# Patient Record
Sex: Male | Born: 1935 | Race: White | Hispanic: No | Smoking: Former smoker
Health system: Southern US, Community
[De-identification: ages and names within clinical notes are randomized; demographics above are authoritative.]

## PROBLEM LIST (undated history)

## (undated) DIAGNOSIS — I509 Heart failure, unspecified: Secondary | ICD-10-CM

## (undated) DIAGNOSIS — R440 Auditory hallucinations: Secondary | ICD-10-CM

## (undated) DIAGNOSIS — K219 Gastro-esophageal reflux disease without esophagitis: Secondary | ICD-10-CM

## (undated) DIAGNOSIS — I10 Essential (primary) hypertension: Secondary | ICD-10-CM

## (undated) DIAGNOSIS — C4490 Unspecified malignant neoplasm of skin, unspecified: Secondary | ICD-10-CM

## (undated) DIAGNOSIS — N400 Enlarged prostate without lower urinary tract symptoms: Secondary | ICD-10-CM

## (undated) DIAGNOSIS — F528 Other sexual dysfunction not due to a substance or known physiological condition: Secondary | ICD-10-CM

## (undated) DIAGNOSIS — F329 Major depressive disorder, single episode, unspecified: Secondary | ICD-10-CM

## (undated) DIAGNOSIS — R569 Unspecified convulsions: Secondary | ICD-10-CM

## (undated) DIAGNOSIS — E079 Disorder of thyroid, unspecified: Secondary | ICD-10-CM

## (undated) DIAGNOSIS — I2510 Atherosclerotic heart disease of native coronary artery without angina pectoris: Secondary | ICD-10-CM

## (undated) DIAGNOSIS — R000 Tachycardia, unspecified: Secondary | ICD-10-CM

## (undated) DIAGNOSIS — Z950 Presence of cardiac pacemaker: Secondary | ICD-10-CM

## (undated) DIAGNOSIS — I219 Acute myocardial infarction, unspecified: Secondary | ICD-10-CM

## (undated) DIAGNOSIS — I472 Ventricular tachycardia: Secondary | ICD-10-CM

## (undated) DIAGNOSIS — F32A Depression, unspecified: Secondary | ICD-10-CM

## (undated) DIAGNOSIS — Z95810 Presence of automatic (implantable) cardiac defibrillator: Secondary | ICD-10-CM

## (undated) DIAGNOSIS — E042 Nontoxic multinodular goiter: Secondary | ICD-10-CM

## (undated) DIAGNOSIS — Z8739 Personal history of other diseases of the musculoskeletal system and connective tissue: Secondary | ICD-10-CM

## (undated) HISTORY — PX: CARDIAC CATHETERIZATION: SHX172

## (undated) HISTORY — PX: CATARACT EXTRACTION W/ INTRAOCULAR LENS  IMPLANT, BILATERAL: SHX1307

## (undated) HISTORY — DX: Other sexual dysfunction not due to a substance or known physiological condition: F52.8

## (undated) HISTORY — PX: BI-VENTRICULAR IMPLANTABLE CARDIOVERTER DEFIBRILLATOR  (CRT-D): SHX5747

## (undated) HISTORY — DX: Disorder of thyroid, unspecified: E07.9

## (undated) HISTORY — DX: Atherosclerotic heart disease of native coronary artery without angina pectoris: I25.10

## (undated) HISTORY — DX: Essential (primary) hypertension: I10

## (undated) HISTORY — DX: Ventricular tachycardia: I47.2

## (undated) HISTORY — DX: Nontoxic multinodular goiter: E04.2

## (undated) HISTORY — DX: Benign prostatic hyperplasia without lower urinary tract symptoms: N40.0

## (undated) HISTORY — PX: SKIN CANCER EXCISION: SHX779

## (undated) HISTORY — DX: Tachycardia, unspecified: R00.0

---

## 2007-02-22 ENCOUNTER — Ambulatory Visit: Payer: Self-pay | Admitting: Endocrinology

## 2007-02-22 LAB — CONVERTED CEMR LAB: TSH: 0.26 microintl units/mL — ABNORMAL LOW (ref 0.35–5.50)

## 2007-02-23 ENCOUNTER — Encounter: Payer: Self-pay | Admitting: Endocrinology

## 2007-02-23 DIAGNOSIS — E0590 Thyrotoxicosis, unspecified without thyrotoxic crisis or storm: Secondary | ICD-10-CM | POA: Insufficient documentation

## 2007-02-23 DIAGNOSIS — I10 Essential (primary) hypertension: Secondary | ICD-10-CM | POA: Insufficient documentation

## 2007-02-23 DIAGNOSIS — N400 Enlarged prostate without lower urinary tract symptoms: Secondary | ICD-10-CM

## 2007-03-08 ENCOUNTER — Encounter: Payer: Self-pay | Admitting: *Deleted

## 2007-04-05 ENCOUNTER — Telehealth: Payer: Self-pay | Admitting: Endocrinology

## 2007-04-10 ENCOUNTER — Encounter: Payer: Self-pay | Admitting: Endocrinology

## 2007-04-19 ENCOUNTER — Encounter: Payer: Self-pay | Admitting: Endocrinology

## 2007-04-20 ENCOUNTER — Ambulatory Visit: Payer: Self-pay | Admitting: Internal Medicine

## 2007-04-20 DIAGNOSIS — F528 Other sexual dysfunction not due to a substance or known physiological condition: Secondary | ICD-10-CM

## 2007-04-23 ENCOUNTER — Telehealth: Payer: Self-pay | Admitting: Endocrinology

## 2007-04-24 ENCOUNTER — Telehealth: Payer: Self-pay | Admitting: Endocrinology

## 2007-04-30 ENCOUNTER — Encounter: Payer: Self-pay | Admitting: Endocrinology

## 2007-05-07 ENCOUNTER — Encounter: Payer: Self-pay | Admitting: Endocrinology

## 2007-05-15 ENCOUNTER — Ambulatory Visit: Payer: Self-pay | Admitting: Endocrinology

## 2007-05-15 DIAGNOSIS — E042 Nontoxic multinodular goiter: Secondary | ICD-10-CM

## 2007-09-03 ENCOUNTER — Ambulatory Visit: Payer: Self-pay | Admitting: Cardiology

## 2007-09-13 ENCOUNTER — Encounter: Payer: Self-pay | Admitting: Cardiology

## 2007-09-26 ENCOUNTER — Ambulatory Visit: Payer: Self-pay | Admitting: Cardiology

## 2007-09-28 ENCOUNTER — Ambulatory Visit: Payer: Self-pay | Admitting: Cardiology

## 2007-09-28 ENCOUNTER — Inpatient Hospital Stay (HOSPITAL_BASED_OUTPATIENT_CLINIC_OR_DEPARTMENT_OTHER): Admission: RE | Admit: 2007-09-28 | Discharge: 2007-09-28 | Payer: Self-pay | Admitting: Cardiology

## 2007-10-01 ENCOUNTER — Ambulatory Visit: Payer: Self-pay | Admitting: Cardiology

## 2007-10-02 ENCOUNTER — Encounter: Payer: Self-pay | Admitting: Cardiology

## 2007-10-17 ENCOUNTER — Encounter: Payer: Self-pay | Admitting: Cardiology

## 2007-10-22 ENCOUNTER — Ambulatory Visit: Payer: Self-pay | Admitting: Internal Medicine

## 2007-11-14 ENCOUNTER — Ambulatory Visit: Payer: Self-pay | Admitting: Cardiology

## 2008-08-14 ENCOUNTER — Ambulatory Visit: Payer: Self-pay | Admitting: Cardiology

## 2009-09-08 ENCOUNTER — Ambulatory Visit: Payer: Self-pay | Admitting: Cardiology

## 2009-09-08 DIAGNOSIS — F4321 Adjustment disorder with depressed mood: Secondary | ICD-10-CM

## 2009-09-08 DIAGNOSIS — I447 Left bundle-branch block, unspecified: Secondary | ICD-10-CM

## 2010-07-13 NOTE — Assessment & Plan Note (Signed)
Summary: 1 YR FU PER MARCH REMINDER-SRS   Visit Type:  Follow-up Primary Provider:  Kipreos  CC:  SOB.  History of Present Illness: the patient is a 77 year old male with history of single-vessel coronary artery disease. The patient is a cervical branch block but preserved LV function. 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 He denies any social chest pain. He stable from a cardiovascular perspective. He does report however that he feels quite good in the morning but as the day progresses he becomes rapidly fatigue. He also complains of generalized weakness and he feels is getting tired for no reason. The patient also stated sometimes he gets downhearted and blue he doesn't feel it is like her to fall anymore.his wife died several years ago and he lives by himself. He stated he feels often  lonely.  Current Problems (verified): 1)  Cad, Native Vessel  (ICD-414.01) 2)  Goiter, Multinodular  (ICD-241.1) 3)  Erectile Dysfunction  (ICD-302.72) 4)  Hyperthyroidism  (ICD-242.90) 5)  Benign Prostatic Hypertrophy  (ICD-600.00) 6)  Hypertension  (ICD-401.9)  Current Medications (verified): 1)  Viagra 100 Mg  Tabs (Sildenafil Citrate) .... Use Prn 2)  Ecotrin Low Strength 81 Mg  Tbec (Aspirin) .Marland Kitchen.. 1 By Mouth Qd 3)  Hydrochlorothiazide 25 Mg Tabs (Hydrochlorothiazide) .... Take 1/2 Tablet By Mouth Two Times A Day 4)  Fish Oil 1000 Mg Caps (Omega-3 Fatty Acids) .... Take 1 Tablet By Mouth Two Times A Day 5)  Icaps  Caps (Multiple Vitamins-Minerals) .... Take 1 Tab Two Times A Day  Allergies (verified): No Known Drug Allergies  Past History:  Past Medical History: Last updated: 03-12-09 multinodular goiter CAD, NATIVE VESSEL (ICD-414.01) GOITER, MULTINODULAR (ICD-241.1) ERECTILE DYSFUNCTION (ICD-302.72) HYPERTHYROIDISM (ICD-242.90) BENIGN PROSTATIC HYPERTROPHY  (ICD-600.00) HYPERTENSION (ICD-401.9)    Past Surgical History: Last updated: 04/20/2007 none  Family History: Last updated: 03/12/09 no thryoid dz mother died with MI 76yo Family History of Coronary Artery Disease:   Social History: Last updated: 03/12/2009 Retired widowed Former Smoker Alcohol use-no Regular Exercise - no Drug Use - no  Risk Factors: Smoking Status: quit (04/20/2007)  Review of Systems       The patient complains of fatigue, muscle weakness, and depression.  The patient denies malaise, fever, weight gain/loss, vision loss, decreased hearing, hoarseness, chest pain, palpitations, shortness of breath, prolonged cough, wheezing, sleep apnea, coughing up blood, abdominal pain, blood in stool, nausea, vomiting, diarrhea, heartburn, incontinence, blood in urine, joint pain, leg swelling, rash, skin lesions, headache, fainting, dizziness, anxiety, enlarged lymph nodes, easy bruising or bleeding, and environmental allergies.    Vital Signs:  Patient profile:   77 year old male Weight:      237 pounds BMI:     32.26 Pulse rate:   77 / minute BP sitting:   131 / 88  (right arm)  Vitals Entered By: Dreama Saa, CNA (September 08, 2009 10:10 AM)   EKG  Procedure date:  09/08/2009  Findings:      normal sinus rhythm left bundle branch block. No acute changes. Heart rate 72 beats per minute  Impression & Recommendations:  Problem # 1:  CAD, NATIVE VESSEL (ICD-414.01) the patient denies any recurrent chest pain. He had a stress test done 2 years ago. There was no ischemia. Continue risk factor modification. The following medications were removed from the medication list:    Zestoretic 20-12.5 Mg Tabs (Lisinopril-hydrochlorothiazide) .Marland Kitchen... 1 by mouth  qd    Bystolic 5 Mg Tabs (Nebivolol hcl) .Marland Kitchen... Take 1 tablet by mouth once a day    Metoprolol Tartrate 25 Mg Tabs (Metoprolol tartrate) .Marland Kitchen... Take 1 tablet by mouth twice a day His updated medication list for  this problem includes:    Ecotrin Low Strength 81 Mg Tbec (Aspirin) .Marland Kitchen... 1 by mouth qd  Problem # 2:  HYPERTENSION (ICD-401.9) blood pressure  is stable and controlled. The following medications were removed from the medication list:    Zestoretic 20-12.5 Mg Tabs (Lisinopril-hydrochlorothiazide) .Marland Kitchen... 1 by mouth qd    Bystolic 5 Mg Tabs (Nebivolol hcl) .Marland Kitchen... Take 1 tablet by mouth once a day    Metoprolol Tartrate 25 Mg Tabs (Metoprolol tartrate) .Marland Kitchen... Take 1 tablet by mouth twice a day His updated medication list for this problem includes:    Ecotrin Low Strength 81 Mg Tbec (Aspirin) .Marland Kitchen... 1 by mouth qd    Hydrochlorothiazide 25 Mg Tabs (Hydrochlorothiazide) .Marland Kitchen... Take 1/2 tablet by mouth two times a day  Problem # 3:  DEPRESSION, SITUATIONAL, MILD (ICD-309.0) the patient has no evidence of major depression. He completed a self rating depression scale score. He scored low. I told him that we continue to follow him for now anticipate that his mood disorder will improve after the winter months.  Problem # 4:  BUNDLE BRANCH BLOCK, LEFT (ICD-426.3) stable and unchanged The following medications were removed from the medication list:    Zestoretic 20-12.5 Mg Tabs (Lisinopril-hydrochlorothiazide) .Marland Kitchen... 1 by mouth qd    Bystolic 5 Mg Tabs (Nebivolol hcl) .Marland Kitchen... Take 1 tablet by mouth once a day    Metoprolol Tartrate 25 Mg Tabs (Metoprolol tartrate) .Marland Kitchen... Take 1 tablet by mouth twice a day His updated medication list for this problem includes:    Ecotrin Low Strength 81 Mg Tbec (Aspirin) .Marland Kitchen... 1 by mouth qd  Patient Instructions: 1)  Your physician recommends that you continue on your current medications as directed. Please refer to the Current Medication list given to you today. 2)  Follow up in  1 year.

## 2010-07-13 NOTE — Procedures (Signed)
Summary: Holter and Event/ [facility] END OF SERVICE SUMMARY REPORT  Holter and Event/ [facility] END OF SERVICE SUMMARY REPORT   Imported By: Dorise Hiss 09/07/2009 15:27:41  _____________________________________________________________________  External Attachment:    Type:   Image     Comment:   External Document

## 2010-08-20 ENCOUNTER — Encounter: Payer: Self-pay | Admitting: *Deleted

## 2010-09-07 ENCOUNTER — Ambulatory Visit (INDEPENDENT_AMBULATORY_CARE_PROVIDER_SITE_OTHER): Payer: Medicare Other | Admitting: Cardiology

## 2010-09-07 ENCOUNTER — Encounter: Payer: Self-pay | Admitting: *Deleted

## 2010-09-07 ENCOUNTER — Encounter: Payer: Self-pay | Admitting: Cardiology

## 2010-09-07 VITALS — BP 133/85 | HR 75 | Ht 72.0 in | Wt 233.0 lb

## 2010-09-07 DIAGNOSIS — I447 Left bundle-branch block, unspecified: Secondary | ICD-10-CM

## 2010-09-07 DIAGNOSIS — I2510 Atherosclerotic heart disease of native coronary artery without angina pectoris: Secondary | ICD-10-CM

## 2010-09-07 DIAGNOSIS — R0602 Shortness of breath: Secondary | ICD-10-CM

## 2010-09-07 DIAGNOSIS — I472 Ventricular tachycardia, unspecified: Secondary | ICD-10-CM

## 2010-09-07 DIAGNOSIS — R5381 Other malaise: Secondary | ICD-10-CM

## 2010-09-07 DIAGNOSIS — R5383 Other fatigue: Secondary | ICD-10-CM | POA: Insufficient documentation

## 2010-09-07 NOTE — Assessment & Plan Note (Signed)
single-vessel coronary disease; patient has increased shortness of breath.  He does have a history of coronary disease.  He is currently not on a statin.  We will order Lexiscan.

## 2010-09-07 NOTE — Assessment & Plan Note (Signed)
fatigue and weakness: May be related to his Parkinson's disease but will also check a TSH and additional blood work including a CBC and electrolytes are liver function tests and a lipid panel.

## 2010-09-07 NOTE — Progress Notes (Signed)
HPI The patient is a 77 year old male with a history of single-vessel coronary artery disease. The patient underwent catheterizationwhich demonstrated 80% right coronary stenosis with what appeared to be recanalized distal right coronary artery.  The patient did not undergo percutaneous intervention as deemed most appropriate for medical management. He has normal LV function.  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  He was previously evaluated by Dr. Ladona Ridgel. The patient has a history of thyroid disease with multinodular goiter.  He also has history of hypertension. The patient has recently been diagnosed with Parkinson's disease but is not on any treatment. He has a left bundle branch block by EKG but this was documented back in March of 2011. Although the patient denies any chest pain he reports shortness of breath sometimes on minimal exertion.  He states that his biggest problem is that he gets extremely fatigued and weak in the course of the day and has absolutely no energy and markedly decreased exercise tolerance.  He also has some difficulty with balance and gait. He presents for follow up  No Known Allergies  Current Outpatient Prescriptions on File Prior to Visit  Medication Sig Dispense Refill  . aspirin 81 MG EC tablet Take 81 mg by mouth daily.        . hydrochlorothiazide 25 MG tablet Take one by mouth daily       . Multiple Vitamins-Minerals (ICAPS MV PO) Take 2 by mouth twice daily       . Omega-3 Fatty Acids (FISH OIL) 1000 MG CAPS Take one by mouth twice daily        . DISCONTD: sildenafil (VIAGRA) 100 MG tablet Take 100 mg by mouth daily as needed.          Past Medical History  Diagnosis Date  . Coronary artery disease   . Thyroid disease     HYPOTHYROIDISM  . Nontoxic multinodular goiter   . Psychosexual dysfunction with inhibited sexual excitement     ERECTILE DYSFUNCTION  . Hypertrophy of prostate without urinary obstruction  and other lower urinary tract symptoms (LUTS)     BPH  . Hypertension     No past surgical history on file.  Family History  Problem Relation Age of Onset  . Heart attack Mother   . Thyroid disease Neg Hx   . Coronary artery disease Other     History   Social History  . Marital Status: Widowed    Spouse Name: N/A    Number of Children: N/A  . Years of Education: N/A   Occupational History  . RETIRED    Social History Main Topics  . Smoking status: Former Smoker -- 1.0 packs/day for 45 years    Quit date: 06/14/1995  . Smokeless tobacco: Not on file  . Alcohol Use: No  . Drug Use: No  . Sexually Active: Not on file   Other Topics Concern  . Not on file   Social History Narrative  . No narrative on file    Pertinent positives as outlined above. The remainder of the 18  point review of systems is negative  PHYSICAL EXAM BP 133/85  Pulse 75  Ht 6' (1.829 m)  Wt 233 lb (105.688 kg)  BMI 31.60 kg/m2  General: Well-developed, well-nourished in no distress Head: Normocephalic and atraumatic Eyes:PERRLA/EOMI intact, conjunctiva and lids normal Ears: No deformity or lesions Mouth:normal dentition, normal posterior pharynx Neck: Supple, no JVD.  No masses, thyromegaly or abnormal cervical  nodes Lungs: Normal breath sounds bilaterally without wheezing.  Normal percussion Cardiac: regular rate and rhythm with normal S1 and S2, no S3 or S4.  PMI is normal.  No pathological murmurs Abdomen: Normal bowel sounds, abdomen is soft and nontender without masses, organomegaly or hernias noted.  No hepatosplenomegaly MSK: Back normal, normal gait muscle strength and tone normal Vascular: Pulse is normal in all 4 extremities Extremities: No peripheral pitting edema Neurologic: Alert and oriented x 3 Skin: Intact without lesions or rashes Lymphatics: No significant adenopathy Psychologic: Normal affect   NWG:NFAOZH sinus rhythm, left bundle branch block  ASSESSMENT AND  PLAN

## 2010-09-07 NOTE — Patient Instructions (Signed)
Lexiscan stress test Labs:  CBC, CMET, TSH, Lipid Panel Can do above labs same day as stress test Follow up in  6 months

## 2010-09-07 NOTE — Assessment & Plan Note (Signed)
No clinical evidence of recurrent arrhythmias

## 2010-09-07 NOTE — Assessment & Plan Note (Signed)
No change compared to prior electrocardiogram.

## 2010-09-13 DIAGNOSIS — R0602 Shortness of breath: Secondary | ICD-10-CM

## 2010-09-23 ENCOUNTER — Encounter: Payer: Self-pay | Admitting: Cardiology

## 2010-09-30 ENCOUNTER — Telehealth: Payer: Self-pay | Admitting: *Deleted

## 2010-09-30 NOTE — Progress Notes (Signed)
Addended by: Hoover Brunette on: 09/30/2010 11:06 AM   Modules accepted: Orders

## 2010-09-30 NOTE — Telephone Encounter (Signed)
Message copied by Hoover Brunette on Thu Sep 30, 2010 11:07 AM ------      Message from: Lewayne Bunting      Created: Fri Sep 17, 2010  6:32 AM       Abnormal cardiolite with significant decrease in LV function. Likely explaining his extreme fatigue and SOB. LV dysfunction is new and new scar on myoview. Order ECHO in meanwhile and he needs a cardiac cath also. Ask him if he is comfortable to proceed without office visit and me call him first or if opening soon in schedule, I can see him first and discuss because he will need some medication adjustments also. Labs are OK- that's not the explanation.

## 2010-09-30 NOTE — Telephone Encounter (Signed)
Patient notified of abnormal stress test.  Patient verbalized understanding.

## 2010-10-04 ENCOUNTER — Telehealth: Payer: Self-pay | Admitting: *Deleted

## 2010-10-04 NOTE — Telephone Encounter (Signed)
Nurse advised patient that echo has been ordered and he will be notified later today as to when it will be scheduled. Nurse also advised patient if he is experiencing extreme sob,then he needed to go to ED to be evaluated. Patient verbalized understanding of plan.

## 2010-10-05 ENCOUNTER — Inpatient Hospital Stay (HOSPITAL_COMMUNITY)
Admission: AD | Admit: 2010-10-05 | Discharge: 2010-10-07 | DRG: 287 | Disposition: A | Discharge status: Home / Self Care | Payer: Medicare Other | Source: Other Acute Inpatient Hospital | Attending: Cardiology | Admitting: Cardiology

## 2010-10-05 ENCOUNTER — Inpatient Hospital Stay (HOSPITAL_COMMUNITY): Payer: Medicare Other

## 2010-10-05 DIAGNOSIS — E785 Hyperlipidemia, unspecified: Secondary | ICD-10-CM | POA: Diagnosis present

## 2010-10-05 DIAGNOSIS — N529 Male erectile dysfunction, unspecified: Secondary | ICD-10-CM | POA: Diagnosis present

## 2010-10-05 DIAGNOSIS — Z7982 Long term (current) use of aspirin: Secondary | ICD-10-CM

## 2010-10-05 DIAGNOSIS — I2510 Atherosclerotic heart disease of native coronary artery without angina pectoris: Secondary | ICD-10-CM | POA: Diagnosis present

## 2010-10-05 DIAGNOSIS — R0602 Shortness of breath: Secondary | ICD-10-CM

## 2010-10-05 DIAGNOSIS — I447 Left bundle-branch block, unspecified: Secondary | ICD-10-CM | POA: Diagnosis present

## 2010-10-05 DIAGNOSIS — I2589 Other forms of chronic ischemic heart disease: Secondary | ICD-10-CM | POA: Diagnosis present

## 2010-10-05 DIAGNOSIS — R0989 Other specified symptoms and signs involving the circulatory and respiratory systems: Secondary | ICD-10-CM

## 2010-10-05 DIAGNOSIS — G20A1 Parkinson's disease without dyskinesia, without mention of fluctuations: Secondary | ICD-10-CM | POA: Diagnosis present

## 2010-10-05 DIAGNOSIS — G20 Parkinson's disease: Secondary | ICD-10-CM | POA: Diagnosis present

## 2010-10-05 DIAGNOSIS — I5023 Acute on chronic systolic (congestive) heart failure: Secondary | ICD-10-CM

## 2010-10-05 DIAGNOSIS — N400 Enlarged prostate without lower urinary tract symptoms: Secondary | ICD-10-CM | POA: Diagnosis present

## 2010-10-05 DIAGNOSIS — R0609 Other forms of dyspnea: Secondary | ICD-10-CM

## 2010-10-05 DIAGNOSIS — I509 Heart failure, unspecified: Secondary | ICD-10-CM | POA: Diagnosis present

## 2010-10-05 DIAGNOSIS — I5021 Acute systolic (congestive) heart failure: Principal | ICD-10-CM | POA: Diagnosis present

## 2010-10-05 DIAGNOSIS — I10 Essential (primary) hypertension: Secondary | ICD-10-CM | POA: Diagnosis present

## 2010-10-06 ENCOUNTER — Inpatient Hospital Stay (HOSPITAL_COMMUNITY): Payer: Medicare Other

## 2010-10-06 DIAGNOSIS — I509 Heart failure, unspecified: Secondary | ICD-10-CM

## 2010-10-06 LAB — COMPREHENSIVE METABOLIC PANEL
Alkaline Phosphatase: 50 U/L (ref 39–117)
BUN: 18 mg/dL (ref 6–23)
Calcium: 8.9 mg/dL (ref 8.4–10.5)
Creatinine, Ser: 0.89 mg/dL (ref 0.4–1.5)
Glucose, Bld: 131 mg/dL — ABNORMAL HIGH (ref 70–99)
Potassium: 3.6 mEq/L (ref 3.5–5.1)
Total Protein: 5.9 g/dL — ABNORMAL LOW (ref 6.0–8.3)

## 2010-10-06 LAB — DIFFERENTIAL
Lymphocytes Relative: 23 % (ref 12–46)
Lymphs Abs: 1.4 10*3/uL (ref 0.7–4.0)
Monocytes Absolute: 0.7 10*3/uL (ref 0.1–1.0)
Monocytes Relative: 11 % (ref 3–12)
Neutro Abs: 3.7 10*3/uL (ref 1.7–7.7)
Neutrophils Relative %: 62 % (ref 43–77)

## 2010-10-06 LAB — TYPE AND SCREEN
ABO/RH(D): A NEG
Antibody Screen: NEGATIVE

## 2010-10-06 LAB — PROTIME-INR
INR: 1.04 (ref 0.00–1.49)
Prothrombin Time: 13.8 seconds (ref 11.6–15.2)

## 2010-10-06 LAB — CBC
HCT: 40.8 % (ref 39.0–52.0)
Hemoglobin: 13.9 g/dL (ref 13.0–17.0)
MCH: 28.8 pg (ref 26.0–34.0)
MCHC: 34.1 g/dL (ref 30.0–36.0)
MCV: 84.6 fL (ref 78.0–100.0)
RBC: 4.82 MIL/uL (ref 4.22–5.81)

## 2010-10-06 LAB — MAGNESIUM: Magnesium: 2 mg/dL (ref 1.5–2.5)

## 2010-10-06 LAB — BRAIN NATRIURETIC PEPTIDE: Pro B Natriuretic peptide (BNP): 320 pg/mL — ABNORMAL HIGH (ref 0.0–100.0)

## 2010-10-07 DIAGNOSIS — I5021 Acute systolic (congestive) heart failure: Secondary | ICD-10-CM

## 2010-10-07 LAB — BASIC METABOLIC PANEL
CO2: 27 mEq/L (ref 19–32)
Chloride: 101 mEq/L (ref 96–112)
Creatinine, Ser: 0.91 mg/dL (ref 0.4–1.5)
GFR calc Af Amer: >60 mL/min (ref >60)

## 2010-10-08 ENCOUNTER — Encounter: Payer: Self-pay | Admitting: Cardiology

## 2010-10-08 ENCOUNTER — Ambulatory Visit: Payer: Medicare Other | Admitting: Cardiology

## 2010-10-14 NOTE — Cardiovascular Report (Signed)
Thomas [name]               ACCOUNT NO.:  [id number]  MEDICAL RECORD NO.:  [id number]           PATIENT TYPE:  I  LOCATION:  2022                         FACILITY:  [facility]  PHYSICIAN:  Veverly Fells. Excell Seltzer, MD  DATE OF BIRTH:  1933-09-15  DATE OF PROCEDURE: DATE OF DISCHARGE:                           CARDIAC CATHETERIZATION   PROCEDURES: 1. Right heart catheterization. 2. Left heart catheterization. 3. Selective coronary angiography. 4. Left ventricular angiography.  PROCEDURE INDICATIONS:  Mr. Thomas [name] is a 77 year old gentleman with coronary artery disease and congestive heart failure.  He has had marked progression of his LV dysfunction after presenting with heart failure symptoms.  His LVEF by echocardiography was 20% with multiple segmental wall motion abnormalities.  He was referred for right heart catheterization for further evaluation.  Risks and indications of procedure were reviewed with the patient. Informed consent was obtained.  The right groin was prepped, draped, and anesthetized with 1% lidocaine using modified Seldinger technique.  A 7- French sheath was placed in the right femoral vein and a 5-French sheath was placed in the right femoral artery.  A Swan-Ganz catheter was used for the right heart catheterization.  Standard Judkins catheters were used for left heart catheterization and coronary angiography.  The patient tolerated the procedure well.  There were no immediate complications.  PROCEDURAL FINDINGS:  Right atrial pressure mean of 10, right ventricular pressure 39/13, pulmonary artery pressure 35/14 with a mean of 24, pulmonary wedge pressure mean ranged from 24-27, left ventricular pressure is 125/21, aortic pressure is 122/73 with a mean of 94.  Aortic oxygen saturation 95%, pulmonary artery saturation 64%.  Fick cardiac outputs 5.13 liters per minute.  Cardiac index is 2.3 liters per minute per meter squared.  Left ventriculography shows  severe global and segmental left ventricular systolic dysfunction.  The basal inferior wall was akinetic.  All other walls appear severely hypokinetic.  Ejection fraction is estimated at 20% or less.  Coronary angiography.  The left mainstem is widely patent.  There is a shelf of plaque in the mid left main with irregularity and approximately 25% stenosis.  The ostial left main is mildly calcified.  The left main divides into the LAD and left circumflex.  LAD.  The LAD has diffuse calcification and mild diffuse disease.  The first and second diagonal branches both have nonobstructive ostial stenosis.  The LAD wraps around the left ventricular apex and the mid vessel beyond the second diagonal has 40-50% stenosis present.  Left circumflex.  The left circumflex is patent.  There is 30-40% stenosis of the mid left circumflex.  There are two obtuse marginal branches.  The first OM has 50% proximal stenosis.  Right coronary artery.  The right coronary artery has diffuse disease with calcification.  There is a linear density in the distal right coronary artery suspicious for chronic dissection.  Further down in the most distal portion of the vessel, there is an 80-90% tubular stenosis leading into the posterior AV segment.  The PDA is very small and fills late.  There is an acute marginal branch of moderate caliber.  The right coronary artery  is unchanged from appearance at the time of previous catheterization in 2009.  FINAL ASSESSMENT: 1. Severe global and segmental left ventricular systolic dysfunction. 2. Severe right coronary artery stenosis unchanged from previous cath. 3. Nonobstructive stenosis of the left main, left anterior descending     coronary artery, and left circumflex. 4. Mildly elevated intracardiac pressures.  RECOMMENDATIONS:  The patient has LV dysfunction well out of proportion to his coronary artery disease.  Recommend medical therapy for CHF  and CAD.     Veverly Fells. Excell Seltzer, MD     MDC/MEDQ  D:  10/06/2010  T:  10/07/2010  Job:  [id number]  Electronically Signed by Tonny Bollman MD on 10/14/2010 10:40:31 AM

## 2010-10-19 ENCOUNTER — Encounter: Payer: Self-pay | Admitting: Cardiology

## 2010-10-19 ENCOUNTER — Ambulatory Visit (INDEPENDENT_AMBULATORY_CARE_PROVIDER_SITE_OTHER): Payer: Medicare Other | Admitting: Cardiology

## 2010-10-19 VITALS — BP 103/70 | HR 71 | Ht 72.0 in | Wt 224.0 lb

## 2010-10-19 DIAGNOSIS — I428 Other cardiomyopathies: Secondary | ICD-10-CM | POA: Insufficient documentation

## 2010-10-19 DIAGNOSIS — I2510 Atherosclerotic heart disease of native coronary artery without angina pectoris: Secondary | ICD-10-CM

## 2010-10-19 DIAGNOSIS — H539 Unspecified visual disturbance: Secondary | ICD-10-CM

## 2010-10-19 DIAGNOSIS — R0989 Other specified symptoms and signs involving the circulatory and respiratory systems: Secondary | ICD-10-CM | POA: Insufficient documentation

## 2010-10-19 DIAGNOSIS — I10 Essential (primary) hypertension: Secondary | ICD-10-CM

## 2010-10-19 DIAGNOSIS — I472 Ventricular tachycardia: Secondary | ICD-10-CM

## 2010-10-19 DIAGNOSIS — R0602 Shortness of breath: Secondary | ICD-10-CM

## 2010-10-19 DIAGNOSIS — I429 Cardiomyopathy, unspecified: Secondary | ICD-10-CM

## 2010-10-19 NOTE — Assessment & Plan Note (Signed)
The patient has a cardiomyopathy as described. It is hard to know how symptomatic he is related to this. I would like to check another BNP.  He does have a baseline for comparison. His blood pressure and symptoms would not tolerate med titration. He might be a candidate for ICD/CRT in the future. I will defer to Dr. Andee Lineman to consider evaluation of other secondary causes of cardiomyopathy although the regional wall motion abnormalities or ischemia would point to this being related to his coronary disease.

## 2010-10-19 NOTE — Patient Instructions (Signed)
Follow up as scheduled. Your physician recommends that you go to the [facility] for lab work:  BNP Your physician has requested that you have a carotid duplex. This test is an ultrasound of the carotid arteries in your neck. It looks at blood flow through these arteries that supply the brain with blood. Allow one hour for this exam. There are no restrictions or special instructions. Your physician recommends that you continue on your current medications as directed. Please refer to the Current Medication list given to you today.

## 2010-10-19 NOTE — Progress Notes (Signed)
HPI The patient presents for followup of a nonischemic cardiomyopathy. He has had a history of distal right coronary artery stenosis managed medically. Recently he was found to have a significant inferior scar with some mild anterolateral ischemia and an ejection fraction of 30%. He underwent cardiac catheterization which demonstrated distal right coronary artery disease with possible chronic dissection.  He had LAD 40-50% stenosis and Circ nonobstructive disease.  His EF was 20% and felt to be out of proportion to the degree of CAD.  He presents for followup and says that he has had problems with his vision. He was recently told to get carotid Dopplers as it was thought by his ophthalmologist if this might be a blood flow problem. He has also had some problems with weakness when his blood pressure is low. He had many questions today about why he is on a blood pressure medicine. He describes some shortness of breath climbing stairs but he is able to walk 30 minutes twice daily without dyspnea. (Class II). He denies PND or orthopnea. He is not having any chest pressure, neck or arm discomfort. He does not report palpitations, presyncope or syncope.  No Known Allergies  Current Outpatient Prescriptions  Medication Sig Dispense Refill  . amantadine (SYMMETREL) 100 MG capsule Take 100 mg by mouth 2 (two) times daily.        Marland Kitchen aspirin 81 MG EC tablet Take 81 mg by mouth daily.        . carvedilol (COREG) 3.125 MG tablet Take 3.125 mg by mouth 2 (two) times daily with a meal.        . DULoxetine (CYMBALTA) 30 MG capsule Take 30 mg by mouth daily.        . furosemide (LASIX) 40 MG tablet Take 40 mg by mouth daily.        . Multiple Vitamins-Minerals (ICAPS MV PO) Take 2 by mouth twice daily       . nitroGLYCERIN (NITROSTAT) 0.4 MG SL tablet Place 0.4 mg under the tongue every 5 (five) minutes as needed.        . NON FORMULARY Take 1 capsule by mouth 2 (two) times daily. Prosvent(OTC)        . Omega-3 Fatty  Acids (FISH OIL) 1000 MG CAPS Take one by mouth twice daily        . omeprazole (PRILOSEC OTC) 20 MG tablet Take 20 mg by mouth daily.        . potassium chloride SA (K-DUR,KLOR-CON) 20 MEQ tablet Take 20 mEq by mouth daily.        . simvastatin (ZOCOR) 20 MG tablet Take 20 mg by mouth at bedtime.        Marland Kitchen lisinopril (PRINIVIL,ZESTRIL) 5 MG tablet Take 5 mg by mouth daily.        Marland Kitchen DISCONTD: hydrochlorothiazide 25 MG tablet Take one by mouth daily         Past Medical History  Diagnosis Date  . Coronary artery disease   . Thyroid disease     HYPOTHYROIDISM  . Nontoxic multinodular goiter   . Psychosexual dysfunction with inhibited sexual excitement     ERECTILE DYSFUNCTION  . Hypertrophy of prostate without urinary obstruction and other lower urinary tract symptoms (LUTS)     BPH  . Hypertension     Past Surgical History  Procedure Date  . Cardiac catheterization     ROS:  As stated in the HPI and negative for all other systems except for ED.  PHYSICAL EXAM BP 103/70  Pulse 71  Ht 6' (1.829 m)  Wt 224 lb (101.606 kg)  BMI 30.38 kg/m2 GENERAL:  Well appearing HEENT:  Pupils equal round and reactive, fundi not visualized, oral mucosa unremarkable NECK:  No jugular venous distention, waveform within normal limits, carotid upstroke brisk and symmetric, possible soft right bruit, no thyromegaly LYMPHATICS:  No cervical, inguinal adenopathy LUNGS:  Clear to auscultation bilaterally BACK:  No CVA tenderness CHEST:  Unremarkable HEART:  PMI not displaced or sustained,S1 and S2 within normal limits, no S3, no S4, no clicks, no rubs, no murmurs ABD:  Flat, positive bowel sounds normal in frequency in pitch, no bruits, no rebound, no guarding, no midline pulsatile mass, no hepatomegaly, no splenomegaly, obese EXT:  2 plus pulses throughout, no edema, no cyanosis no clubbing SKIN:  No rashes no nodules NEURO:  Cranial nerves II through XII grossly intact, motor grossly intact  throughout PSYCH:  Cognitively intact, oriented to person place and time  ASSESSMENT AND PLAN

## 2010-10-19 NOTE — Assessment & Plan Note (Signed)
He will continue with risk reduction. 

## 2010-10-19 NOTE — Assessment & Plan Note (Signed)
This will be evaluated with the Doppler above.

## 2010-10-26 ENCOUNTER — Telehealth: Payer: Self-pay | Admitting: *Deleted

## 2010-10-26 NOTE — Assessment & Plan Note (Signed)
[facility]                         ELECTROPHYSIOLOGY OFFICE NOTE   NAME:CAULEYSuresh, Audi                        MRN:          [id number]  DATE:10/22/2007                            DOB:          Oct 18, 1933    Mr. Thomas [name] is referred today by Dr. Andee Lineman for evaluation of  palpitations and irregular heartbeat and near syncope.  The patient is a  very pleasant 77 year old man who has a history of silent myocardial  infarction in the past with overall preserved LV function and a small  inferior basilar akinetic area.  The patient underwent catheterization  which demonstrated 80% right coronary stenosis with what appeared to be  recanalized distal right coronary artery.  The patient did not undergo  percutaneous intervention as deemed most appropriate for medical  management.  The patient, because of his palpitations, has recently worn  a CardioNet monitor which demonstrates paroxysms of atrial fibrillation  along with some wide QRS rhythms, unclear whether this represents  nonsustained VT or Ashman-like phenomenon.  The patient also complains  of nausea and indigestion and some belching.  He was seen at one of our  GI doctors and told that no additional workup was required at this time;  specifically, a colonoscopy or endoscopy were not recommend.   CURRENT MEDICATIONS:  1. Aspirin 81 mg daily.  2. Metoclopramide.  3. Lisinopril 20 a day.  4. Lorazepam 1 mg daily.  5. Omeprazole.  6. Clarithromycin.  7. Amoxicillin.  8. Metoprolol 25 mg twice daily.   FAMILY HISTORY:  Is notable for both parents being deceased.  His mother  died in her 84s of an MI.  His father died at the same age of unknown  causes. He has sister and brother who are both living.   SOCIAL HISTORY:  The patient is retired.  He has a history of tobacco  use but stopped smoking in 1996.  He denies alcohol abuse.   REVIEW OF SYSTEMS:  Notable for dyspnea on exertion and dizziness.  The  patient notes that he was walking several miles a day until about 6  months ago, and since then he becomes short of breath and fatigued  easily.  He also has problems with arthritis and gout.  He has a history  of generalized fatigue, as previously noted, and some dysuria.  His  additional Review of Systems is notable for dyspepsia and severe fatigue  and weakness.  Other Review of Systems were all negative except as noted  above.   PHYSICAL EXAMINATION:  GENERAL:  He is a pleasant, well-appearing, 26-  year-old man in no acute distress.  VITAL SIGNS:  The blood pressure today was 118/75. The pulse was 76 and  regular. Respirations were 18.  Weight was 215 pounds.  HEENT:  Normocephalic and atraumatic.  Pupils equal and round.  Oropharynx was moist.  Sclerae anicteric.  NECK:  Revealed no jugular distention.  No thyromegaly.  Trachea is  midline.  LUNGS:  Clear bilaterally to auscultation.  No wheezes, rales or rhonchi  are present.  CARDIOVASCULAR:  Exam revealed irregular rate and rhythm with  normal S1-  S2.  PMI was not enlarged nor was it laterally displaced.  ABDOMINAL:  Soft, nontender.  There was no organomegaly.  Bowel sounds  present. No rebound or guarding.  EXTREMITIES:  Demonstrated no cyanosis, clubbing or edema.  The pulses  were 2+ and symmetric.  NEUROLOGIC:  Alert and oriented x3.  Cranial nerves intact.  Strength  5/5 and symmetric.   EKG demonstrates sinus rhythm.   Review the patient's cardiac monitor demonstrates sinus rhythm with  sinus bradycardia with periods of regular wide QRS tachycardia  consistent either with atrial tachycardia, atrial fibrillation of  regular rate, or ventricular tachycardia.  I could not discern which of  these this is.   IMPRESSION:  1. Wide QRS tachycardia, not particularly symptomatic.  2. Severe fatigue and weakness of unknown etiology.  3. Hypertension for which the patient is on multiple medications.  4. Possible  gastritis.   DISCUSSION:  The patient's symptoms are not strictly clear-cut for me.  I have recommended a period of watchful waiting.  I have asked that he  stop his amlodipine because I think this is complicating his treatment  regimen, though if blood pressure goes up, he is instructed to let us  know, and I would try to up titrate his beta blocker.  It could be that  his fatigue is related to all of his antihypertensive medications, but I  think that his tachycardia would increase without beta blockers, so I  have recommend that he continue this.  I will plan to see the patient  back in the office on a p.r.n. basis and will discuss his case with Dr.  Andee Lineman.     Doylene Canning. Ladona Ridgel, MD  Electronically Signed    GWT/MedQ  DD: 10/22/2007  DT: 10/22/2007  Job #: [id number]

## 2010-10-26 NOTE — Assessment & Plan Note (Signed)
[facility]                          [facility] OFFICE NOTE   NAME:Thomas [name], Thomas [name]                        MRN:          [id number]  DATE:10/01/2007                            DOB:          11-25-1933    HISTORY OF PRESENT ILLNESS:  The patient is a 77 year old male with a  history of goiter hypertension, benign BPH and asbestos exposure.  The  patient, for the last several weeks, had increased dyspnea which has  been associated with belching.  Given an abnormal Cardiolite study, he  was referred for cardiac catheterization.  He was found to have mild to  moderate reduction in LV function with inferobasal wall motion  abnormalities suggesting akinetic to aneurysmal segment in the inferior  base.  There was evidence of a long air recanalized dissected distal  right coronary artery with 80%narrowing prior to small take up of the  PDA and two PLA branches which appear to be in distribution of the wall  motion abnormality.  The patient had additional diffuse scattered  coronary irregularities.  The patient was also scheduled follow-up for  with Dr. Ladona Ridgel.  Given a CardioNet monitor which demonstrated atrial  fibrillation degenerating in a wide complex rhythm which is likely  however, secondary to Ashman's phenomenon.  The patient has been placed  on beta-blocker since that time with some other improvement in his  symptoms.  However, his main complaint remains significant belching.  Interestingly, the patient has had a longstanding history of belching,  but this has been very mild.  This is the only episode since weeks ago  when he had sudden onset of presyncope that this acutely has worsened.   MEDICATIONS:  Listed in the chart and include:  1. Aspirin 81 mg p.o. daily.  2. Metoclopramide.  3. Lisinopril 20 mg p.o. daily.  4. Lorazepam 1 mg p.o. daily.  5. Omeprazole.  6. Clarithromycin.  7. Amoxicillin.  8. Metoprolol 25 mg p.o. daily.   PHYSICAL EXAMINATION:  VITAL SIGNS:  Blood pressure is 117/75, heart  rate 69, weight 212 pounds.  GENERAL:  Well-nourished white male in no apparent distress.  HEENT:  Pupils are equal, conjunctivae clear.  NECK:  Supple, normal carotid stroke and no carotid bruits.  LUNGS:  Clear breath sounds bilaterally.  HEART:  Regular rate and rhythm with normal S1-S2.  No murmur, rubs or  gallops.  ABDOMEN:  Soft, nontender, no rebound or guarding.  Good bowel sounds.  EXTREMITIES:  No cyanosis, clubbing or edema.   PROBLEMS:  1. Abnormal Cardiolite stress study.      a.     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     Nonobstructive coronary artery disease.      c.     Mild to moderate LV dysfunction.  2. Atrial fibrillation with rapid ventricular response and Ashman's      phenomena (paroxysmal)  3. Nausea and belching.      a.     History of chronic dyspepsia.      b.     Rule out  angina equivalent.      c.     Recent diagnosis H. pylori by serology treated with       antibiotics per Dr. Mariea Clonts.   PLAN:  1. The patient's symptoms of nausea and belching remain puzzling.  I      do not think they represent an angina equivalent, although the      patient does have a high grade RCA lesion which recanalized      dissected area.  I have discussed this with Dr. Riley Kill who felt on      medical treatment was most appropriate.  However, if no alternate      explanation for the patient's belching, consideration could be      given to intervention, although the wall motion abnormality in the      inferobasal segment is quite akinetic and suggestive of nonviable      myocardium  2. I feel the patient needs a thorough GI evaluation regarding his      nausea and belching, and I have referred him to Dr. Karilyn Cota.  He is      being treated currently for H. Pylori, although he has not seen any      improvement in his symptomatology yet.  3. The patient also had an EP appointment  with Dr. Ladona Ridgel to discuss      the implications of paroxysmal atrial fibrillation, more      importantly the documentation of wide complex rhythm which I      suspect secondary to Ashman's phenomena.  I have not started the      patient on Coumadin, although his earliest CHADS score would be      indicative of need for Coumadin given the fact that he needs a GI      evaluation first and just had a recent catheterization.  We will      hold off on Coumadin for now.  4. The patient will follow up with me after he has seen both Dr.      Ladona Ridgel and Dr. Karilyn Cota.     Learta Codding, MD,FACC  Electronically Signed    GED/MedQ  DD: 10/01/2007  DT: 10/01/2007  Job #: [id number]   cc:   Marisue Ivan, MD

## 2010-10-26 NOTE — Assessment & Plan Note (Signed)
[facility]                          [facility]ICE NOTE   NAME:Thomas [name], Thomas [name]                        MRN:          [id number]  DATE:11/14/2007                            DOB:          1934/04/20    HISTORY OF PRESENT ILLNESS:  The patient is a 77 year old male with  single-vessel coronary artery disease as detailed in prior notes.  The  patient has a normal ejection fraction by echocardiography.  The patient  was referred to Dr. Ladona Ridgel for wide complex rhythm with the possibility  of Ashman's phenomena with brief episodes of atrial fibrillation.  It  was felt that no further therapy was initiated but to continue beta  blocker therapy and carefully monitor the patient.  The patient is in  normal sinus rhythm in the office.  His main complaint centers around  weakness.  He was hypotensive when he was seen by Dr. Ladona Ridgel in the  office and we took him off his amlodipine.  Blood pressure now remains  low at 96/65.  He denies any chest pain, orthopnea, palpitations or  syncope.  The patient does report decreased exercise tolerance and  fatigue.   MEDICATIONS:  1. Aspirin 81 mg p.o. daily.  2. Lisinopril 20 mg p.o. daily.  3. Metoprolol 25 mg p.o. b.i.d.  4. Triamterine hydrochlorothiazide 37.5/25 mg p.o. b.i.d.   PHYSICAL EXAMINATION:  VITAL SIGNS:  Blood pressure 96/65, heart rate 80  beats per minute, weights 215 pounds.  NECK:  Normal carotid upstroke.  No carotid bruits.  LUNGS:  Clear breath sounds bilaterally.  HEART:  Regular rate and rhythm.  Normal S1, S2.  No murmurs, gallops.  ABDOMEN:  Soft, nontender rebound or guarding.  Good bowel sounds.  EXTREMITIES:  No cyanosis, clubbing or edema.  It is of note that the  patient does report burning and numbness in the lower extremities.   PROBLEM LIST:  1. Single-vessel coronary artery disease.  2. Preserved left ventricular function.  3. Hypotension, rule out orthostatic hypotension and  dysautonomia.  4. Rule out lower extremity neuropathy.  5. 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   PLAN:  1. The patient remains hypotensive and certainly could contribute to      his symptoms of fatigue and weakness.  We will stop his of      lisinopril today.  2. The patient has significant problems with numbness in the lower      extremities in the setting of low blood pressure, makes me      concerned about a neuropathy and a possible dysautonomia.  3. The patient's EMG and nerve conduction velocities are positive.      Further suggests testing for diabetes mellitus including glucose      loading and hemoglobin A1c are indicated.  4. I have told the patient not to use Viagra in the interim while his      blood pressure is relatively      low.  5. The patient will come back to see me in 6 months.     Learta Codding, MD,FACC  Electronically Signed  GED/MedQ  DD: 11/14/2007  DT: 11/14/2007  Job #: [id number]   cc:   Ernestine Conrad, MD

## 2010-10-26 NOTE — Cardiovascular Report (Signed)
Thomas [name], Thomas [name]               ACCOUNT NO.:  [id number]   MEDICAL RECORD NO.:  [id number]          PATIENT TYPE:  OIB   LOCATION:  1963                         FACILITY:  [facility]   PHYSICIAN:  Arturo Morton. Riley Kill, MD, FACCDATE OF BIRTH:  08/16/1933   DATE OF PROCEDURE:  09/28/2007  DATE OF DISCHARGE:                            CARDIAC CATHETERIZATION   INDICATIONS:  Thomas [name] is a very delightful gentleman who has a  history of a goiter, hypertension, benign prostatic hypertrophy, and  asbestos exposure.  He has been short of breath for several weeks.  Recently, he was admitted to the hospital with a near syncopal episode  at [facility].  He underwent exercise stress testing, where he was able to  complete 7 minutes at a MET load of 10.  His functional capacity was  good.  He had no chest pain or EKG changes.  Importantly, the patient  had an inferior defect with ejection fraction of 41%.  An echocardiogram  was read as normal.  A CardioNet device suggested atrial fibrillation,  with ventricular arrhythmia on completion of this.  However, the main  symptom has been recurrent burping and belching and declined exercise  tolerance.  He denies any specific episode of profound chest pain.  Dr.  Andee Lineman sent him for further evaluation.   PROCEDURE:  1. Left heart catheterization  2. Selective coronary angiography .  3. Selective left ventriculography.   DESCRIPTION OF PROCEDURE:  The patient was brought to the  catheterization laboratory, prepped and draped in usual fashion.  Through the anterior puncture femoral artery was easily entered and a 4-  [place] sheath was placed.  Following this views of the left and right  coronaries were obtained through multiple angiographic projections.  Central aortic and left ventricular pressures were measured with pigtail  catheter.  Ventriculography was performed in the RAO projection.  Following the pressure, the pullback pigtail catheter was removed.   He  was taken to the holding area in satisfactory clinical condition.  I  then spoke with Dr. Andee Lineman at length from the phone.  I also reviewed  the films with the patient's children.  He was taken to the holding area  in satisfactory clinical condition.   HEMODYNAMIC DATA:  1. Initial central aortic pressure was 141/72.  2. Left ventricular pressure is 132/10.  3. There was no gradient pullback across aortic valve.   ANGIOGRAPHIC DATA.:  1. Ventriculography was done in the RAO projection.  There is an      akinetic-to-aneurysmal segment in the inferior base.  It is      relatively small, ejection fraction be estimated 5%.  2. The coronaries were generally calcified.  3. The left main history of free of critical disease.  4. The left anterior descending artery courses to the apex.  There are      two areas of probably 30%-40% calcified disease with luminal      irregularities throughout.  No critical high-grade stenoses were      noted.  5. The circumflex coronary artery demonstrates a segmental calcified      plaque  of about 50% at the midportion and marginal branch has a sub      branch with 50%-60% narrowing.  There is some diffuse luminal      irregularities noted above.  6. The right coronary artery is also calcified in the midportion.  The      right coronary artery is a 50% area of narrowing distally, there      appears to be a recanalized vessel with probably multiple areas of      hazy density suggesting a recanalized dissected distal right      coronary.  It narrows down to about 80%, it then provides 3 fairly      small branches including a posterior descending and 2      posterolateral branches both of which are only moderate in size.   CONCLUSIONS:  1. Mild-to-moderate reduction in left ventricular function with an      inferobasal wall motion abnormality suggesting an akinetic to      aneurysmal segment in the inferior base.  2. Long area of recanalized dissected  distal right coronary with 80%      narrowing prior to a small takeoff of the PDA and two PLA branches      which appears to be in the in the distribution of the wall motion      abnormality.  3. Diffuse scattered coronary irregularities as noted above with the      worst being in the proximal circumflex of about 50%-60%, is also      scheduled to see Dr. Lewayne [name] in the electrophysiology office      in [place].  The exact approach to this is unclear.  The      radionuclide imaging study was suggestive completed infarct without      evidence of reversible ischemia in the inferior base.  The      ventriculogram certainly is consistent with this previous finding      and importantly, the patient was able to walk for 7 minutes of on      the Bruce protocol without chest pain or EKG changes.  Whether or      not he will be benefit by revascularization of the distal right      coronaries seems unlikely, but if his symptoms persist it would be      a reasonable option.  Dr. Andee Lineman will review the situation when he      returns for the office visit.  We will proceed from there.      Arturo Morton. Riley Kill, MD, [facility]  Electronically Signed     TDS/MEDQ  D:  09/28/2007  T:  09/29/2007  Job:  [id number]   cc:   Learta Codding, MD,FACC  CV laboratory

## 2010-10-26 NOTE — Telephone Encounter (Signed)
Pt states he was started on simvastatin at d/c from hospital. He states he is now having "real bad hip pains." He states he didn't have the pain before taking simvastatin. He states pain is in both hips and denies other pain or problems. He wonders if simvastatin could be causing this pain. Pt notified to try to continue simvastatin if possible while waiting for a response from Dr. Earnestine Leys. If unable to tolerate present dose. Pt aware to try 1/2 tablet every night. Pt verbalized understanding.

## 2010-10-26 NOTE — Assessment & Plan Note (Signed)
[facility]                          [facility] NOTE   NAME:Thomas [name], Jividen                        MRN:          [id number]  DATE:09/26/2007                            DOB:          1933-11-10    REFERRING PHYSICIAN:  Dr. Marisue Ivan   CURRENT COMPLAINTS:  Near syncope and nausea.   HISTORY OF PRESENT ILLNESS:  The patient is a 77 year old male with no  prior history of coronary artery disease.  .  The patient was recently  admitted on September 02, 2007, to [facility] after near syncopal  event.  The patient reports that normally he walks five miles every  morning.  The morning of admission around the 3-mile mark, he noticed  some onset of worsening his chronic indigestion and belching.  The  patient became to feel very weak and had a near syncopal episode.  He  was leaning over to his right side.  He never fell or had loss of  consciousness.  The episodes lasted about 15-30 seconds, was able to  complete his 5-mile walk, but felt very weak after that.  Following  this, the patient continued to have significant amount of belching and  sensation of nausea.  He denied having substernal chest pain.  There was  no chest pain on exertion.  The patient also denies any shortness of  breath.  He was admitted to [facility] during that time and  underwent a stress test.  Ejection fraction was 41%.  There was evidence  of inferior or bite fixed defect with severe hypokinesis.  The finding  could be consistent prior myocardial function.  However, by  echocardiogram, no wall motion abnormality was noted.  An ejection  fraction was 55-60%.  The patient also was able to achieve a maximum  work up of 10.1 minutes on the treadmill.  Following this, the patient  was discharged.  He was up titrated on his antireflux medications.  Given, however, history of near syncope, a cardiac monitor was ordered  upon discharged.  We reviewed his monitor  today, and the patient's  episodes of rapid atrial fibrillation, heart rate up to 190 beats per  minute with additional episodes of Ashman's phenomena.  The patient  stated he continued to feel very poor, weak with ongoing belching with  marked decrease in exercise tolerance.  Of note is that the patient does  have a history of chronic GERD which is stable.  He also has a history  of mass in the thyroid which has been ruled out for neoplasm.  In  addition he has a history of hypertension which has been well  controlled.   PAST MEDICAL HISTORY:  As outlined above.  This includes.  1. History of goiter and thyroid mass which has been biopsied that was      negative for malignancy.  2. History of hypertension.  3. History of BPH.  4. History of asbestos exposure with pleural calcifications.  5. Past history of lower extremity edema.  Ruled out for DVT.   MEDICATIONS:  1. Aspirin 81 mg p.o. daily.  2. Metoclopramide10 mg p.o. daily.  3. Lisinopril 20 mg p.o. daily.  4. Lorazepam 1 mg p.o. daily.  5. Amlodipine 5 mg p.o. daily.   FAMILY HISTORY:  Noncontributory.  Negative for coronary artery disease.   SOCIAL HISTORY:  The patient quit tobacco several years ago.  He used to  be a heavy smoker.  He worked in Emergency planning/management officer.  Had asbestos  exposure in the past.  He is married.   REVIEW OF SYSTEMS:  The patient reports general weakness and malaise.  The patient reports some nausea and belching.  He denies any hematuria  or dysuria.  He has no visual or auditory changes.  He denies any  substernal chest pain.  He reports near syncope with details as outlined  above.  The rest of review of systems is otherwise within normal limits.   PHYSICAL EXAMINATION:  VITAL SIGNS:  Blood pressure is 137/77, heart  rate 80 beats minute, weight is 211 pounds, temperature is afebrile.  GENERAL:  Well-nourished white male in no apparent distress.  HEENT:  Conjunctivae are clear.  Pupils are  isocoria.  oropharynx and  nasopharynx is within normal limits.  Auditory canals are within normal  limits.  NECK:  Exam reveals thyromegaly and normal JVD up to 6 cm.  RESPIRATORY:  Normal auscultation exam.  Breath sounds are somewhat  diminished at the bases.  Inspiratory effort is within normal limits.  Percussion of the chest is within normal limits.  On palpation, no  abnormal masses are felt.  CARDIOVASCULAR:  Regular rate and rhythm.  Normal S1, S2.  No murmur,  rubs or gallops.  The patient currently is within normal limits.  There  is no definite edema.  There are no carotid bruits.  Normal carotid  upstroke bilaterally.  No definite pulsatile masses palpated in the  abdomen and femoral pulses are normal bilaterally with no bruits.  Pedal  pulses are 2+ bilaterally with normal dorsalis pedis and posterior  tibial.  GI:  Abdominal exam is within normal limits with no rebound or  guarding, but there is no hepatosplenomegaly.  There is no midline  hernia noted.  GYN:  Exam is deferred.  LYMPHATIC:  Exam reveals no nodes in the neck, axilla or groin.  MUSCULOSKELETAL:  Gait is normal.  Range of motion of the major joints  are within normal limits.  SKIN:  Demonstrates no ecchymoses and was warm and dry.  NEUROLOGICAL:  The patient is alert and grossly nonfocal with cranial  nerves II to XII intact.  DTRs bilaterally and sensory exam is normal.  Psychomotor:  Patient oriented to time with normal memory.   STUDIES:  Tests were reviewed including patient's CardioNet monitor  which demonstrated several episodes of rapid atrial fibrillation  degenerating to Ashman's phenomena.  I doubt this represents ventricular  tachycardia.  Echo and Cardiolite were reviewed and details are as  outlined above.   PROBLEMS:  1. Presyncope.      a.     Rule out atrial fibrillation with rapid ventricular span.      b.     Rule out ischemic heart disease with malignant arrhythmia.  2. Atrial  fibrillation.  3. Nausea and belching.      a.     History of chronic dyspepsia.      b.     Rule out angina equivalent.  4. History of recent H. pylori and positive serology (treated with      antibiotics by  Dr. Adria Dill)   PLAN:  1. The patient has concerning symptoms of presyncope.  They appear to      occur on exertion.  Cardiac monitor does demonstrate several      episodes of rapid atrial fibrillation which certainly could be      symptomatic.  I have started the patient on beta-blocker therapy      and we advised him to get his heart monitor for now.  The patient      may need additional medical therapy.  2. The patient's belching and heartburn are concerning and could be an      angina equivalent.  He has developed decreased exercise tolerance      and weakness over the last several weeks and he has risk factors      for coronary artery disease.  Will proceed with cardiac      catheterization to rule out underlying coronary disease as an      angina equivalent.  3. There is evidence of underlying coronary arteries by Cardiolite.      See details above.  4. I did call Dr. Adria Dill during his clinic visit.  We discussed with      him the results of CT abdomen and upper GI that was recently done.      There was no gross abnormality reportedly.  5. The patient has agreed to proceed with cardiac catheterization.      Will be scheduled in our [facility] cath lab this Friday.     Learta Codding, MD,FACC  Electronically Signed    GED/MedQ  DD: 09/27/2007  DT: 09/27/2007  Job #: [id number]   cc:   Marisue Ivan, MD  Ernestine Conrad, MD

## 2010-10-29 NOTE — Telephone Encounter (Signed)
Will send to Gene Serpe, PA for review as Dr. Andee Lineman is out of the office.

## 2010-10-29 NOTE — Telephone Encounter (Signed)
Agree with decreasing dose by 1/2, and continue monitoring

## 2010-11-02 NOTE — Telephone Encounter (Signed)
Left message to return call 

## 2010-11-10 NOTE — Telephone Encounter (Signed)
Patient notified of below.   

## 2010-12-23 ENCOUNTER — Encounter: Payer: Self-pay | Admitting: Cardiology

## 2010-12-31 ENCOUNTER — Encounter: Payer: Self-pay | Admitting: Cardiology

## 2010-12-31 ENCOUNTER — Ambulatory Visit (INDEPENDENT_AMBULATORY_CARE_PROVIDER_SITE_OTHER): Payer: Medicare Other | Admitting: Cardiology

## 2010-12-31 DIAGNOSIS — I2510 Atherosclerotic heart disease of native coronary artery without angina pectoris: Secondary | ICD-10-CM

## 2010-12-31 DIAGNOSIS — I472 Ventricular tachycardia: Secondary | ICD-10-CM

## 2010-12-31 DIAGNOSIS — R000 Tachycardia, unspecified: Secondary | ICD-10-CM

## 2010-12-31 DIAGNOSIS — I447 Left bundle-branch block, unspecified: Secondary | ICD-10-CM

## 2010-12-31 DIAGNOSIS — I428 Other cardiomyopathies: Secondary | ICD-10-CM

## 2010-12-31 NOTE — Patient Instructions (Signed)
   Stop HCTZ  Follow up in  3 months - see above  EP evaluation for device - see above.

## 2011-01-03 NOTE — Assessment & Plan Note (Signed)
No recurrent wide-complex rhythms or palpitations. Chronic left bundle branch block present during sinus rhythm.

## 2011-01-03 NOTE — Assessment & Plan Note (Signed)
The patient has worsening heart failure symptoms. She has an ejection fraction of 20% and a left bundle branch block with a QRS duration greater than 150 ms. Despite his Parkinson's disease the patient still fairly active. He will be referred to Dr. Johney Frame for an evaluation of possible CRT-D. Other option may be CRT-D alone also.

## 2011-01-03 NOTE — Progress Notes (Signed)
HPI  The patient is a 77 year old male with history of coronary artery disease and congestive heart failure. Because of marked progression of LV dysfunction after presenting with heart failure symptoms he was referred earlier this year for cardiac catheterization. Left ventricular ejection fraction was 20% multiple segmental wall motion abnormalities. Left and right heart catheterization was performed which showed severe global and segmental LV dysfunction. However there was severe right coronary artery disease unchanged from prior cardiac catheterization of nonobstructive stenosis of the left main, LAD and left circumflex coronary artery. He had mildly elevated left ventricular intracardiac pressures. There was no significant pulmonary hypertension. Wedge pressure mean range from 24-27. Cardiac index was 2.3 L per meter squared. Recent BNP level was 318. The patient has a chronic left bundle branch block which is typical with QRS duration of 162 ms. The patient also has retrograde flow in the left renal artery but clinically has no evidence of subclavian steal syndrome and in particular there is only 10 mm of mercury difference between both arms. The patient has history of Parkinson's disease and is somewhat limited in his activities. However, he has noted a marked decline in his exercise duration and is ADLs. He short of breath on minimal exertion. His in NYHA class IIb/3. He denies any chest pain orthopnea PND. There is no significant volume overload today. A bedside echocardiogram today was noted for an ejection fraction of approximately 20%. Multiple segmental wall motion abnormalities were noted.  No Known Allergies  Current Outpatient Prescriptions on File Prior to Visit  Medication Sig Dispense Refill  . amantadine (SYMMETREL) 100 MG capsule Take 100 mg by mouth daily.       Marland Kitchen aspirin 81 MG EC tablet Take 81 mg by mouth daily.        . carvedilol (COREG) 3.125 MG tablet Take 3.125 mg by mouth 2  (two) times daily with a meal.        . DULoxetine (CYMBALTA) 30 MG capsule Take 30 mg by mouth daily.        . furosemide (LASIX) 40 MG tablet Take 40 mg by mouth daily.        Marland Kitchen lisinopril (PRINIVIL,ZESTRIL) 5 MG tablet Take 5 mg by mouth daily.        . Multiple Vitamins-Minerals (ICAPS MV PO) Take 2 by mouth twice daily       . nitroGLYCERIN (NITROSTAT) 0.4 MG SL tablet Place 0.4 mg under the tongue every 5 (five) minutes as needed.        . NON FORMULARY Take 1 capsule by mouth 2 (two) times daily. Prosvent(OTC)        . Omega-3 Fatty Acids (FISH OIL) 1000 MG CAPS Take one by mouth twice daily        . omeprazole (PRILOSEC OTC) 20 MG tablet Take 20 mg by mouth daily.        . potassium chloride SA (K-DUR,KLOR-CON) 20 MEQ tablet Take 20 mEq by mouth daily.        . sildenafil (VIAGRA) 100 MG tablet Take 100 mg by mouth as needed.        . simvastatin (ZOCOR) 20 MG tablet Take 20 mg by mouth at bedtime.          Past Medical History  Diagnosis Date  . Coronary artery disease     80% distal RCA stenosis.  . Thyroid disease     HYPOTHYROIDISM  . Nontoxic multinodular goiter   . Psychosexual dysfunction with inhibited sexual  excitement     ERECTILE DYSFUNCTION  . Hypertrophy of prostate without urinary obstruction and other lower urinary tract symptoms (LUTS)     BPH  . Hypertension   . Wide-complex tachycardia     Past Surgical History  Procedure Date  . Cardiac catheterization     Family History  Problem Relation Age of Onset  . Heart attack Mother   . Thyroid disease Neg Hx   . Coronary artery disease Other     History   Social History  . Marital Status: Widowed    Spouse Name: N/A    Number of Children: N/A  . Years of Education: N/A   Occupational History  . RETIRED    Social History Main Topics  . Smoking status: Former Smoker -- 1.0 packs/day for 45 years    Quit date: 06/14/1995  . Smokeless tobacco: Never Used  . Alcohol Use: No  . Drug Use: No  .  Sexually Active: Not on file   Other Topics Concern  . Not on file   Social History Narrative  . No narrative on file    RUE:[id number] positives as outlined above. The remainder of the 18  point review of systems is negative  PHYSICAL EXAM BP 108/71  Pulse 62  Ht 6' (1.829 m)  Wt 226 lb 4 oz (102.626 kg)  BMI 30.68 kg/m2  General: Well-developed, well-nourished in no distress, noted for symptoms of Parkinson's disease. Head: Normocephalic and atraumatic Eyes:PERRLA/EOMI intact, conjunctiva and lids normal Ears: No deformity or lesions Mouth:normal dentition, normal posterior pharynx Neck: Supple, no JVD.  No masses, thyromegaly or abnormal cervical nodes Lungs: Normal breath sounds bilaterally without wheezing.  Normal percussion Cardiac: regular rate and rhythm with normal S1 and paradoxically split second heart sound. no S3 or S4.  PMI is normal.  No pathological murmurs Abdomen: Normal bowel sounds, abdomen is soft and nontender without masses, organomegaly or hernias noted.  No hepatosplenomegaly MSK: Back normal, normal gait muscle strength and tone normal Vascular: Pulse is normal in all 4 extremities Extremities: No peripheral pitting edema Neurologic: Alert and oriented x 3, cogwheel rigidity. Skin: Intact without lesions or rashes Lymphatics: No significant adenopathy Psychologic: Normal affect   ECG: Normal sinus rhythm with left bundle branch block QRS duration greater than 150 ms [162 ms.]. Heart rate 66 beats per minute  ASSESSMENT AND PLAN

## 2011-01-03 NOTE — Assessment & Plan Note (Signed)
No recurrent chest pain. Patient has used cardiac catheterization no unstable symptoms. Continue medical therapy

## 2011-01-03 NOTE — Assessment & Plan Note (Signed)
Ejection fraction 20%. The patient is on Lasix and Lipitor rhythm relatively low. Has stopped hydrochlorothiazide.

## 2011-01-06 NOTE — Discharge Summary (Signed)
NAMEMARVELOUS, BOUWENS               ACCOUNT NO.:  [id number]  MEDICAL RECORD NO.:  [id number]           PATIENT TYPE:  I  LOCATION:  2022                         FACILITY:  [facility]  PHYSICIAN:  Pricilla Riffle, MD, FACCDATE OF BIRTH:  12-09-1933  DATE OF ADMISSION:  10/05/2010 DATE OF DISCHARGE:  10/07/2010                              DISCHARGE SUMMARY   PRIMARY CARDIOLOGIST:  Learta Codding, MD, [facility]  DISCHARGE DIAGNOSIS:  Acute systolic congestive heart failure.  SECONDARY DIAGNOSES: 1. Coronary artery disease status post catheterization this admission     with 90% distal right coronary artery stenosis which is stable and     otherwise, nonobstructive disease. 2. Ischemic cardiomyopathy, ejection fraction 20% by left     ventriculography this admission. 3. History of nontoxic multinodular goiter. 4. Erectile dysfunction. 5. Hypertension. 6. Hyperlipidemia. 7. Benign prostatic hypertrophy. 8. History of left bundle-branch block. 9. Parkinson disease.  ALLERGIES:  No known drug allergies.  PROCEDURES:  Left heart cardiac catheterization revealing stable coronary anatomy including a 90% distal stenosis in the right coronary artery and otherwise, diffuse scattered moderate nonobstructive coronary artery disease.  Ejection fraction was 20% with severe global left ventricular dysfunction.  Right heart catheterization showed a wedge of 24-27 mmHg.  HISTORY OF PRESENT ILLNESS:  This is a 77 year old male with prior history of coronary artery disease which has been medically managed. The patient was recently seen in clinic by Dr. Andee Lineman with complaints of progressive dyspnea and for that reason, he underwent Myoview stress testing showing EF of 30% with a small fixed anterior and apical defects as well as a small reversible defect in the mid inferior and lateral segments.  There was also a large area of inferior scar with mid inferolateral peri-infarct ischemia.  Based on this  finding, a decision was made to pursue outpatient echocardiography, however, due to progression of dyspnea occurring at rest and also at night, the patient presented to [facility] and was admitted on October 04, 2010, with congestive heart failure.  He was seen by Cardiology there with decision made to transfer to [facility] for further evaluation and diagnostic catheterization to help to assist in determining why his EF is now suddenly down.  HOSPITAL COURSE:  Following admission, the patient continued to complain of dyspnea, orthopnea, and overall weakness.  Decision was made to pursue cardiac catheterization which took place on October 06, 2010, showing moderate nonobstructive CAD as well as a stable 90% stenosis in the distal right coronary artery.  Continued medical therapy was recommended.  His EF was 20% with severe global LV dysfunction and right heart catheterization revealed RA of 10, RV of 39/13, and a wedge of 24- 27 mmHg with an LVEDP of 21 mmHg.  Additional diuresis was warranted. With diuresis, the patient has had symptomatic improvement and has been ambulating without difficulty.  His weight is down to 101.7 kg whereas he was 104.3 on admission to [facility].  The patient has been placed on beta-blocker, ACE inhibitor, and oral Lasix therapy.  He will be discharged home today in good condition.  He will require a followup  basic metabolic panel in approximately 1-2 weeks.  DISCHARGE LABORATORY DATA:  Hemoglobin 13.9, hematocrit 40.8, WBC 6.0, platelets 131.  Sodium 136, potassium 4.2, chloride 101, CO2 is 27, BUN 21, creatinine 0.91, glucose 114, total bilirubin 1.1, alkaline phosphatase 50, AST 21, ALT 15, total protein 5.9, albumin 3.7, calcium 9.6, magnesium 2.0.  BNP was 320 on admission.  TSH 1.093.  DISPOSITION:  The patient will be discharged home today in good condition.  FOLLOWUP PLANS AND APPOINTMENTS:  We have arranged for followup with Dr. Antoine Poche in  our [facility] on Oct 19, 2010, at 1:45 p.m.  DISCHARGE MEDICATIONS: 1. Carvedilol 3.125 mg b.i.d. 2. Lasix 20 mg daily. 3. Lisinopril 5 mg daily. 4. Nitroglycerin 0.4 mg sublingual p.r.n. chest pain. 5. Potassium chloride 20 mEq daily. 6. Simvastatin 20 mg nightly. 7. Amantadine hydrochloride 100 mg as previously prescribed. 8. Aspirin 81 mg daily. 9. Cymbalta 30 mg daily. 10.Fish oil 1000 mg b.i.d. 11.Icaps, over the counter, 2 tablets daily. 12.Prosvent 1 tablet b.i.d.  OUTSTANDING LABORATORY STUDIES:  Followup basic metabolic panel on Oct 19, 2010, visit with Dr. Antoine Poche.  Plan for followup 2-D echocardiogram in 12 weeks on maximal medical therapy to determine ICD candidacy.     Nicolasa Ducking, ANP   ______________________________ Pricilla Riffle, MD, [facility]    CB/MEDQ  D:  10/07/2010  T:  10/08/2010  Job:  [id number]  Electronically Signed by Nicolasa Ducking ANP on 10/09/2010 04:04:44 PM Electronically Signed by Dietrich Pates MD FACC on 01/06/2011 11:51:18 PM

## 2011-01-27 ENCOUNTER — Ambulatory Visit (INDEPENDENT_AMBULATORY_CARE_PROVIDER_SITE_OTHER): Payer: Medicare Other | Admitting: Internal Medicine

## 2011-01-27 ENCOUNTER — Encounter: Payer: Self-pay | Admitting: Internal Medicine

## 2011-01-27 ENCOUNTER — Encounter: Payer: Self-pay | Admitting: *Deleted

## 2011-01-27 DIAGNOSIS — I5023 Acute on chronic systolic (congestive) heart failure: Secondary | ICD-10-CM | POA: Insufficient documentation

## 2011-01-27 DIAGNOSIS — I2510 Atherosclerotic heart disease of native coronary artery without angina pectoris: Secondary | ICD-10-CM

## 2011-01-27 DIAGNOSIS — I428 Other cardiomyopathies: Secondary | ICD-10-CM

## 2011-01-27 DIAGNOSIS — I447 Left bundle-branch block, unspecified: Secondary | ICD-10-CM

## 2011-01-27 DIAGNOSIS — I5022 Chronic systolic (congestive) heart failure: Secondary | ICD-10-CM

## 2011-01-27 NOTE — Assessment & Plan Note (Signed)
As above.

## 2011-01-27 NOTE — Progress Notes (Signed)
HPI  Thomas [name] is seen at the request of DR DeGent for consideration of device implantaion  He  is a 77 year old male with history of coronary artery disease and congestive heart failure. Because of marked progression of LV dysfunction after presenting with heart failure symptoms he was referred earlier this year for cardiac catheterization. Left ventricular ejection fraction was 20% multiple segmental wall motion abnormalities. Left and right heart catheterization was performed which showed severe global and segmental LV dysfunction. However there was severe right coronary artery disease unchanged from prior cardiac catheterization of nonobstructive stenosis of the left main, LAD and left circumflex coronary artery. He had mildly elevated left ventricular intracardiac pressures. There was no significant pulmonary hypertension. Wedge pressure mean range from 24-27. Cardiac index was 2.3 L per meter squared.. The patient has a chronic left bundle branch block which is typical with QRS duration of 162 ms. The patient also has retrograde flow in the left renal artery but clinically has no evidence of subclavian steal syndrome and in particular there is only 10 mm of mercury difference between both arms.  The pt has significant DOE and fatigue  He had an episode a few months ago of syncope while driving abrupt in onset.  No other recent events   No edema or PND or orthopnea The patient has history of Parkinson's disease and is somewhat limited in his activities. However, he has noted a marked decline in his exercise duration and is ADLs. He short of breath on minimal exertion. His in NYHA class IIb/3.      A bedside echocardiogram last month was noted for an ejection fraction of approximately 20%. Multiple segmental wall motion abnormalities were noted.  He has parkinson, mostly limited to left arm  No Known Allergies  Current Outpatient Prescriptions on File Prior to Visit  Medication Sig Dispense  Refill  . amantadine (SYMMETREL) 100 MG capsule Take 100 mg by mouth daily.       Marland Kitchen aspirin 81 MG EC tablet Take 81 mg by mouth daily.        . carvedilol (COREG) 3.125 MG tablet Take 3.125 mg by mouth 2 (two) times daily with a meal.        . DULoxetine (CYMBALTA) 30 MG capsule Take 30 mg by mouth daily.        . furosemide (LASIX) 40 MG tablet Take 40 mg by mouth daily.        Marland Kitchen lisinopril (PRINIVIL,ZESTRIL) 5 MG tablet Take 5 mg by mouth daily.        . nitroGLYCERIN (NITROSTAT) 0.4 MG SL tablet Place 0.4 mg under the tongue every 5 (five) minutes as needed.        . NON FORMULARY Take 1 capsule by mouth 2 (two) times daily. Prosvent(OTC)        . Omega-3 Fatty Acids (FISH OIL) 1000 MG CAPS Take one by mouth twice daily        . omeprazole (PRILOSEC OTC) 20 MG tablet Take 20 mg by mouth daily.        . potassium chloride SA (K-DUR,KLOR-CON) 20 MEQ tablet Take 20 mEq by mouth daily.        . sildenafil (VIAGRA) 100 MG tablet Take 100 mg by mouth as needed.        . simvastatin (ZOCOR) 20 MG tablet Take 20 mg by mouth at bedtime.        . Multiple Vitamins-Minerals (ICAPS MV PO) Take 2 by mouth twice daily  Past Medical History  Diagnosis Date  . Coronary artery disease     80% distal RCA stenosis.  . Thyroid disease     HYPOTHYROIDISM  . Nontoxic multinodular goiter   . Psychosexual dysfunction with inhibited sexual excitement     ERECTILE DYSFUNCTION  . Hypertrophy of prostate without urinary obstruction and other lower urinary tract symptoms (LUTS)     BPH  . Hypertension   . Wide-complex tachycardia     Past Surgical History  Procedure Date  . Cardiac catheterization     Family History  Problem Relation Age of Onset  . Heart attack Mother   . Thyroid disease Neg Hx   . Coronary artery disease Other     History   Social History  . Marital Status: Widowed    Spouse Name: N/A    Number of Children: N/A  . Years of Education: N/A   Occupational History  .  RETIRED    Social History Main Topics  . Smoking status: Former Smoker -- 1.0 packs/day for 45 years    Quit date: 06/14/1995  . Smokeless tobacco: Never Used  . Alcohol Use: No  . Drug Use: No  . Sexually Active: Not on file   Other Topics Concern  . Not on file   Social History Narrative  . No narrative on file    ZOX:[id number] positives as outlined above. The remainder of the 18  point review of systems is negative  PHYSICAL EXAM BP 124/74  Pulse 73  Ht 6' (1.829 m)  Wt 228 lb (103.42 kg)  BMI 30.92 kg/m2  General: Well-developed, well-nourished in no distress, noted for symptoms of Parkinson's disease. Head: Normocephalic and atraumatic Eyes:PERRLA/EOMI intact, conjunctiva and lids normal Ears: No deformity or lesions Mouth:normal dentition, normal posterior pharynx Neck: Supple, JVP 6-7 cm  No masses, thyromegaly or abnormal cervical nodes Lungs: Normal breath sounds bilaterally without wheezing.  Normal percussion Cardiac: regular rate and rhythm with normal S1 and paradoxically split second heart sound. no S3 or S4.  PMI is normal.  No pathological murmurs Abdomen: Normal bowel sounds, abdomen is soft and nontender without masses, organomegaly or hernias noted.  No hepatosplenomegaly MSK: Back normal, normal gait muscle strength and tone normal Vascular: Pulse is normal in all 4 extremities Extremities: No peripheral pitting edema Neurologic: Alert and oriented x 3, cogwheel rigidity.soem tremor Skin: Intact without lesions or rashes Lymphatics: No significant adenopathy Psychologic: Normal affect   ECG: Normal sinus rhythm with left bundle branch block QRS duration greater than 150 ms [162 ms.]. Heart rate 66 beats per minute  ASSESSMENT AND PLAN

## 2011-01-27 NOTE — Assessment & Plan Note (Signed)
Continue current meds 

## 2011-01-27 NOTE — Assessment & Plan Note (Signed)
He has significant risks and is appropriately referred for CRT-D   We have discussed R/B/A  His syncope is very worrisome and further validates the need

## 2011-01-27 NOTE — Patient Instructions (Signed)

## 2011-02-01 ENCOUNTER — Encounter: Payer: Self-pay | Admitting: Internal Medicine

## 2011-02-07 ENCOUNTER — Ambulatory Visit (HOSPITAL_COMMUNITY)
Admission: RE | Admit: 2011-02-07 | Discharge: 2011-02-08 | Disposition: A | Discharge status: Home / Self Care | Payer: Medicare Other | Source: Ambulatory Visit | Attending: Internal Medicine | Admitting: Internal Medicine

## 2011-02-07 DIAGNOSIS — E876 Hypokalemia: Secondary | ICD-10-CM | POA: Insufficient documentation

## 2011-02-07 DIAGNOSIS — I428 Other cardiomyopathies: Secondary | ICD-10-CM

## 2011-02-07 DIAGNOSIS — I447 Left bundle-branch block, unspecified: Secondary | ICD-10-CM | POA: Insufficient documentation

## 2011-02-07 DIAGNOSIS — I2510 Atherosclerotic heart disease of native coronary artery without angina pectoris: Secondary | ICD-10-CM | POA: Insufficient documentation

## 2011-02-07 DIAGNOSIS — N400 Enlarged prostate without lower urinary tract symptoms: Secondary | ICD-10-CM | POA: Insufficient documentation

## 2011-02-07 DIAGNOSIS — I5022 Chronic systolic (congestive) heart failure: Secondary | ICD-10-CM | POA: Insufficient documentation

## 2011-02-07 DIAGNOSIS — E785 Hyperlipidemia, unspecified: Secondary | ICD-10-CM | POA: Insufficient documentation

## 2011-02-07 DIAGNOSIS — G20 Parkinson's disease: Secondary | ICD-10-CM | POA: Insufficient documentation

## 2011-02-07 DIAGNOSIS — G20A1 Parkinson's disease without dyskinesia, without mention of fluctuations: Secondary | ICD-10-CM | POA: Insufficient documentation

## 2011-02-07 DIAGNOSIS — I509 Heart failure, unspecified: Secondary | ICD-10-CM | POA: Insufficient documentation

## 2011-02-07 DIAGNOSIS — I10 Essential (primary) hypertension: Secondary | ICD-10-CM | POA: Insufficient documentation

## 2011-02-07 DIAGNOSIS — N529 Male erectile dysfunction, unspecified: Secondary | ICD-10-CM | POA: Insufficient documentation

## 2011-02-07 LAB — CBC
Hemoglobin: 15.5 g/dL (ref 13.0–17.0)
MCH: 29.1 pg (ref 26.0–34.0)
MCV: 84.6 fL (ref 78.0–100.0)
Platelets: 150 10*3/uL (ref 150–400)
RBC: 5.32 MIL/uL (ref 4.22–5.81)

## 2011-02-07 LAB — BASIC METABOLIC PANEL
CO2: 31 mEq/L (ref 19–32)
Calcium: 9.7 mg/dL (ref 8.4–10.5)
Creatinine, Ser: 0.62 mg/dL (ref 0.50–1.35)
Glucose, Bld: 95 mg/dL (ref 70–99)

## 2011-02-07 LAB — SURGICAL PCR SCREEN: Staphylococcus aureus: NEGATIVE

## 2011-02-08 ENCOUNTER — Ambulatory Visit (HOSPITAL_COMMUNITY): Payer: Medicare Other

## 2011-02-08 LAB — BASIC METABOLIC PANEL
BUN: 17 mg/dL (ref 6–23)
Calcium: 9.6 mg/dL (ref 8.4–10.5)
Creatinine, Ser: 0.76 mg/dL (ref 0.50–1.35)
Potassium: 3.9 mEq/L (ref 3.5–5.1)
Sodium: 141 mEq/L (ref 135–145)

## 2011-02-10 ENCOUNTER — Telehealth: Payer: Self-pay | Admitting: Internal Medicine

## 2011-02-10 ENCOUNTER — Encounter: Payer: Self-pay | Admitting: Internal Medicine

## 2011-02-10 NOTE — Telephone Encounter (Signed)
Pt having swelling in hand and arm, noticed last night

## 2011-02-17 ENCOUNTER — Ambulatory Visit (INDEPENDENT_AMBULATORY_CARE_PROVIDER_SITE_OTHER): Payer: Medicare Other | Admitting: *Deleted

## 2011-02-17 ENCOUNTER — Ambulatory Visit: Payer: Medicare Other | Admitting: *Deleted

## 2011-02-17 ENCOUNTER — Encounter: Payer: Self-pay | Admitting: Internal Medicine

## 2011-02-17 DIAGNOSIS — I428 Other cardiomyopathies: Secondary | ICD-10-CM

## 2011-02-17 DIAGNOSIS — I10 Essential (primary) hypertension: Secondary | ICD-10-CM

## 2011-02-17 DIAGNOSIS — I2510 Atherosclerotic heart disease of native coronary artery without angina pectoris: Secondary | ICD-10-CM

## 2011-02-17 DIAGNOSIS — I447 Left bundle-branch block, unspecified: Secondary | ICD-10-CM

## 2011-02-17 LAB — ICD DEVICE OBSERVATION
AL AMPLITUDE: 1.8 mv
AL IMPEDENCE ICD: 418 Ohm
AL THRESHOLD: 0.625 V
ATRIAL PACING ICD: 16.3 pct
HV IMPEDENCE: 41 Ohm
LV LEAD THRESHOLD: 2.5 V
RV LEAD IMPEDENCE ICD: 456 Ohm
RV LEAD THRESHOLD: 0.875 V

## 2011-02-17 NOTE — Op Note (Signed)
  NAMERICHARD, RITCHEY               ACCOUNT NO.:  [id number]  MEDICAL RECORD NO.:  [id number]  LOCATION:  6531                         FACILITY:  [facility]  PHYSICIAN:  Hillis Range, MD       DATE OF BIRTH:  24-Apr-1934  DATE OF PROCEDURE: DATE OF DISCHARGE:  02/08/2011                              OPERATIVE REPORT   SURGEON:  Hillis Range, MD  PREPROCEDURE DIAGNOSES: 1. Ischemic cardiomyopathy. 2. Status post biventricular implantable cardioverter-defibrillator     implantation by Dr. Graciela Husbands on February 07, 2011.  POSTPROCEDURE DIAGNOSES: 1. Ischemic cardiomyopathy. 2. Status post biventricular implantable cardioverter-defibrillator     implantation by Dr. Graciela Husbands on February 07, 2011.  PROCEDURES:  Ventricular fibrillation defibrillation threshold testing.  INTRODUCTION:  Mr. Gentles is a pleasant 77 year old gentleman who has undergone implantation of a Medtronic Protecta XT biventricular ICD by Dr. Graciela Husbands on February 07, 2011.  Defibrillation threshold testing was not performed due to hyperkalemia at that time.  His hyperkalemia has now been corrected.  He therefore presents today for defibrillation threshold testing.  DESCRIPTION OF PROCEDURE:  Informed written consent was obtained and the patient was brought to the electrophysiology lab in the fasting state. Adequate IV access and airway support were assured.  The patient was then adequately sedated with intravenous Versed and fentanyl as outlined in the nursing report.  His defibrillator was interrogated and right ventricular and left ventricular lead sensing threshold and impedance, values were confirmed to be stable.  Atrial lead P-waves measured 2.8 mV with an impedance of 513 ohms and a threshold of 0.375 volts at 0.4 milliseconds.  Right ventricular lead R-wave was measured 13 mV with an impedance of 551 ohms and a threshold of 0.5 volts at 0.4 milliseconds. The left ventricular lead impedance was 247 ohms with a threshold  of 1.625 volts at 0.4 milliseconds.  Ventricular fibrillation was then induced with a T-wave shock.  Adequate sensing of ventricular fibrillation with no dropout was observed with a programmed sensitivity of 1.2 mV.  Ventricular fibrillation was successfully defibrillated with a single 15 joules shock delivered from the device with an impedance of 43 ohms and a duration of 2.6 seconds.  The patient remained in sinus rhythm thereafter.  Programmed extra stimulus testing was performed through the device from the right ventricular apex with a basic cycle length of 500 milliseconds with S1, S2, S3, S4 extrastimuli down to refractoriness (500/260/240/200 milliseconds) with no arrhythmias observed.  The procedure was therefore considered completed.  There were no early apparent complications.  CONCLUSIONS: 1. Defibrillation threshold less than or equal to 15 joules. 2. No early apparent complications.     Hillis Range, MD     JA/MEDQ  D:  02/08/2011  T:  02/08/2011  Job:  [id number]  cc:   Duke Salvia, MD, [facility]  Electronically Signed by Hillis Range MD on 02/17/2011 08:53:32 AM

## 2011-02-18 ENCOUNTER — Telehealth: Payer: Self-pay | Admitting: Internal Medicine

## 2011-02-18 LAB — BASIC METABOLIC PANEL
BUN: 16 mg/dL (ref 6–23)
CO2: 31 mEq/L (ref 19–32)
Calcium: 8.9 mg/dL (ref 8.4–10.5)
GFR: 100.09 mL/min (ref >60.00)
Glucose, Bld: 105 mg/dL — ABNORMAL HIGH (ref 70–99)
Sodium: 140 mEq/L (ref 135–145)

## 2011-02-18 NOTE — Telephone Encounter (Signed)
Dr. Kirtland Bouchard Calling to report pt has blood clot on left arm where pacemaker placed. Wants to know if pt should be treated like regular DVT, or differntly since pt has pace maker  It target range regular. Please return call to discuss further.

## 2011-02-18 NOTE — Telephone Encounter (Signed)
LM for clinic to call

## 2011-02-18 NOTE — Telephone Encounter (Signed)
Treat like a regular DVT per Dr Ladona Ridgel.  N/A at Dr Adria Dill' office.  LM for them to call.

## 2011-02-21 NOTE — Telephone Encounter (Signed)
Line busy. Will try again.

## 2011-02-22 NOTE — Telephone Encounter (Signed)
LM for Dr Adria Dill nurse

## 2011-02-22 NOTE — Telephone Encounter (Signed)
N/A.  LMTC. 

## 2011-02-24 ENCOUNTER — Telehealth: Payer: Self-pay | Admitting: Internal Medicine

## 2011-02-24 NOTE — Telephone Encounter (Signed)
Pt daughter calling stating that pt is c/o breathing difficulties and feeling extremely weak. Pt hasn't felt good for about one week. Pt came to office last week and blood clot was discovered day before. Please return pt daughter call to discuss further.

## 2011-02-24 NOTE — Telephone Encounter (Signed)
I left a message for the patient's daughter that I would call back in the morning. I also left word that if his symptoms were concerning this evening, they should take the patient to the nearest emergency room.

## 2011-02-25 NOTE — Telephone Encounter (Signed)
I spoke with the patient. He states that since he has had his device implanted, he still feels weak and SOB. This is the same as prior to device implant, He also states that when he came for his wound check that he was having some "thumping" that he was feeling, but he said the person he saw thought they had fixed it. He states he is feeling this "thumping" in his lower chest/ belly. I explained to him that what he is having sounds like diaphragmatic stimulation. I explained that since he feels no worse than before, that I would review with Dr. Graciela Husbands and call him back on Tuesday. He also wanted to know when he can sleep on his implant side. I will forward to Dr. Graciela Husbands for review.

## 2011-02-28 NOTE — Telephone Encounter (Signed)
3 issues;  1) he may be having diaghragm stimulation adn he needs a CXR pa and lat as well as reprogramming 2) 30% of pt may not benefit,but once we know that the lead is in the right place then we can do AV optimization to hopefully help 3) he needs a BMET tjhanks steve

## 2011-03-01 NOTE — Telephone Encounter (Signed)
LMTC. Please note the area code left at the bottom of this message is wrong.

## 2011-03-02 ENCOUNTER — Encounter: Payer: Self-pay | Admitting: Internal Medicine

## 2011-03-02 NOTE — Telephone Encounter (Signed)
Patient called back. He is aware of the need for a chest xray and bmet. Order for both faxed to the [facility] at [phone]. Patient will go there today at 4 pm.

## 2011-03-04 ENCOUNTER — Telehealth: Payer: Self-pay | Admitting: Internal Medicine

## 2011-03-04 NOTE — Telephone Encounter (Signed)
Pt calling to see results of x-ray. Please return call to discuss further.

## 2011-03-04 NOTE — Telephone Encounter (Signed)
C xray results given to daughter, she verbalized understanding. Daughter states patient still C/O of feeling his heart beat and pacer when he sits or when  Standing.

## 2011-03-07 ENCOUNTER — Encounter: Payer: Self-pay | Admitting: Internal Medicine

## 2011-03-07 NOTE — Op Note (Signed)
NAMEJAYSTON, Thomas [name]               ACCOUNT NO.:  [id number]  MEDICAL RECORD NO.:  [id number]  LOCATION:  6531                         FACILITY:  [facility]  PHYSICIAN:  Duke Salvia, MD, FACCDATE OF BIRTH:  Nov 10, 1933  DATE OF PROCEDURE:  02/07/2011 DATE OF DISCHARGE:                              OPERATIVE REPORT   PREOPERATIVE DIAGNOSES: 1. Nonischemic cardiomyopathy. 2. Left bundle-branch block. 3. Congestive heart failure syncope.  POSTOPERATIVE DIAGNOSES: 1. Nonischemic cardiomyopathy. 2. Left bundle-branch block. 3. Congestive heart failure syncope.  PROCEDURE:  Dual-chamber defibrillator implantation with left ventricular lead placement.  SURGEON:  Duke Salvia, MD, [facility]  DESCRIPTION OF PROCEDURE:  Following obtaining informed consent, the patient was brought to the electrophysiology laboratory and placed on the fluoroscopic table in supine position.  After routine prep and drape of the left upper chest, lidocaine was infiltrated in the prepectoral subclavicular region.  Incision was made and carried down to the layer of the prepectoral fascia.  Using electrocautery and sharp dissection, a pocket was formed similarly.  Hemostasis was obtained.  Thereafter, attention was turned to gain access to the extrathoracic left subclavian vein which was accomplished without difficulty without aspiration of air or puncture of the artery.  Three separate venipunctures were accomplished.  Guidewires were placed and retained sequentially.  A 9, 9.5, and 7-French sheaths were placed which were passed sequentially.  A Medtronic [id number] dual coil defibrillator lead, serial number [id number] which was manipulated in the right ventricular apex where the bipolar R-wave was 13.2 with a pace impedance of 642 with threshold of 0.4 at 0.5, current of threshold 0.5 mA.  There was no diaphragmatic pacing.  The current of injury is brisk.  An MB2 coronary sinus cannulation catheter over the  [facility] wire was then utilized to cannulate the coronary sinus.  Balloon venography demonstrated only a high lateral branch which was then targeted.  We initially tried a 1258 lead from [place]. Jude.  It was not stable and in the distal ramification of the vein we had diaphragmatic stimulation in all pacing configurations. This lead was withdrawn and was replaced with a Medtronic 4194 88-cm lead, serial number [id number].  It was placed in the mid position between base and apex with pacing through the proximal coil to the RV coil.  Sensitivities were about 12 mV, pace impedance of 400 ohms, and threshold of 3 volts at 0.5.  This lead was left in the sheath during the deployment of the atrial lead which was a Medtronic 5076 52-cm lead, serial number [id number].  Attempts to deploy this in the right atrial appendage were unsuccessful because of inadequate current of injuries and so it was placed in the lateral wall where bipolar P-wave was 4.7 with pace impedance of 959, threshold 2.4 at 0.5 immediately following screw deployment, current of threshold was 3.2 mA.  There was no diaphragmatic pacing at 10 volts and the current of injury was brisk. These leads were secured to the prepectoral fascia.  The deployment system was removed and the LV lead was secured to the prepectoral fascia and the leads were attached to Medtronic Delta 314TRG Secure device, serial number [id number].  Through the device, the bipolar  P-wave was 2.8 with pace impedance of 530, a threshold of 1 volt at 0.4.  The R- wave was 10.6 with pace impedance of 551, a threshold 0.5 at 0.4.  The LV ring to RV coil impedance was 285 with a threshold 2 volts at 0.4 and 1.5 volts at 1.  High-voltage impedances were 48 in the proximal coil and 45 in the distal coil.  Because of the patient's potassium level been elevated at 5.9, it was elected to defer defibrillation threshold testing.  The leads in the pulse generator were then placed  in the pocket and secured to the prepectoral fascia.  Hemostasis having been assured and copious irrigation with antibiotic containing saline solution having been done, the wound was then closed in 3 layers in normal fashion.  The wound was washed, dried, and benzoin and Steri- Strip dressing was applied.  Needle counts, sponge counts, and instrument counts were correct at the end of the procedure according to the staff.  The patient tolerated the procedure without apparent complication.     Duke Salvia, MD, [facility]     SCK/MEDQ  D:  02/07/2011  T:  02/08/2011  Job:  [id number]  Electronically Signed by Sherryl Manges MD [facility] on 03/07/2011 04:01:37 PM

## 2011-03-07 NOTE — Discharge Summary (Signed)
[name], [name] NO.:  [id number]  MEDICAL RECORD NO.:  [id number]  LOCATION:  6531                         FACILITY:  [facility]  PHYSICIAN:  Duke Salvia, MD, FACCDATE OF BIRTH:  [birth year]  DATE OF ADMISSION:  02/07/2011 DATE OF DISCHARGE:  02/08/2011                              DISCHARGE SUMMARY   PRIMARY CARE PHYSICIAN:  Ernestine Conrad, MD  PRIMARY CARDIOLOGIST:  Learta Codding, MD, [facility]  ELECTROPHYSIOLOGIST:  Duke Salvia, MD, [facility]  PRIMARY DIAGNOSES:  Nonischemic cardiomyopathy, left bundle-branch block, and congestive heart failure.  SECONDARY DIAGNOSES: 1. Coronary artery disease with a 90% distal right coronary artery     stenosis, which was stable with catheterization in April of this     year. 2. History of nontoxic multinodal goiter. 3. Erectile dysfunction. 4. Hypertension. 5. Hyperlipidemia. 6. Benign prostatic hypertrophy. 7. Parkinson's disease.  ALLERGIES:  The patient has no known drug allergies.  PROCEDURES THIS ADMISSION: 1. Implantation of a cardiac resynchronization defibrillator by Dr.     Graciela Husbands on February 07, 2011.  The patient received a Medtronic model     number [id number] ICD with model number 5076 right atrial lead, model     number 6947 right ventricular lead, and model number 4194 left     ventricular lead.  DFTs were deferred on day of implant secondary     to hyperkalemia. 2. DFT testing on February 08, 2011, with Dr. Johney Frame.  These were     successful at 15 joules.  The patient had no early apparent     complications. 3. Chest x-ray on February 08, 2011, demonstrated no pneumothorax status     post device implant.  The patient did have prominent lung markings     diffusely likely due to chronic lung disease.  BRIEF HISTORY OF PRESENT ILLNESS:  [name] is a 77 year old male with a history of coronary artery disease, congestive heart failure, ischemic cardiomyopathy.  He has had marked progression of LV dysfunction  after presenting with heart failure symptoms.  Because of these things, he is referred for cardiac catheterization, which showed stable disease.  He was referred to Dr. Graciela Husbands in the outpatient setting for evaluation for cardiac resynchronization defibrillator.  The patient also had an episode of syncope about a year ago while driving, which is abrupt in onset.  He has had no recent other events.  Risks, benefits, and alternatives of cardiac resynchronization therapy defibrillator were discussed with the patient.  He wished to proceed.  HOSPITAL COURSE:  The patient was admitted on February 07, 2011, for planned implantation of a cardiac resynchronization therapy defibrillator.  This was carried out with Dr. Graciela Husbands with details as outlined above.  He was monitored on telemetry, which demonstrated sinus rhythm with Bi-V pacing.  His QRS duration had decreased to 126 milliseconds from 160 milliseconds preprocedure.  The patient did have some diaphragmatic stimulation.  Device was programmed around this. Final LV program configuration was LV ring-RV coil with a programed output of 2.75 volts at 0.4 milliseconds.  A threshold in this configuration was 1.75 volts at 0.4 milliseconds.  Because of  hyperkalemia on presentation, Dr. Graciela Husbands recommended discontinuing  his potassium.  On date of discharge, his potassium was 3.9.  He will have a BMET in 2 days at primary care physician's office in [place], [place] with results be faxed to Dr. Graciela Husbands.  His family has been advised that they should call the [facility] office the afternoon of the lab draw to receive results and instructions about potassium.  Dr. Graciela Husbands examined the patient on February 08, 2011, and considered him stable for discharge.  His left chest was without hematoma or ecchymosis.  Wound care, arm mobility, and discharge instructions were reviewed with the patient.  DISCHARGE INSTRUCTIONS: 1. Increase activity slowly. 2. No  driving for 1 week. 3. Follow a low-sodium, heart-healthy diet. 4. See supplemental device discharge instructions for wound care and     arm mobility.  FOLLOWUP APPOINTMENTS: 1. Lab work at the patient's primary care physician's office in     [place], [place] on Thursday, February 10, 2011 for a BMET.  The     patient has been given a prescription for this.  The patient and     his family has been advised to call the [facility] office     Thursday afternoon to get results and instructions regarding     potassium dosage. 2. [facility] on February 17, 2011 at 11:00 a.m.     - this is in the [place] office. 3. Dr. Graciela Husbands in the [facility] office May 24, 2011, at 3:45 p.m. 4. Dr. Andee Lineman in the [facility] office on April 04, 2011, at 1:00 p.m.  DISCHARGE MEDICATIONS: 1. Amantadine 100 mg 1 capsule every morning. 2. Aspirin 81 mg daily. 3. Cymbalta 30 mg daily. 4. Coreg 3.125 mg twice daily with meals. 5. Fish oil 1000 mg twice daily. 6. Lasix 40 mg daily. 7. Lisinopril 5 mg daily. 8. Nitroglycerin sublingual 0.4 mg as needed for chest pain. 9. Omeprazole 20 mg daily. 10.Simvastatin 20 mg daily. 11.Viagra 100 mg daily as needed for erectile function. 12.ICap over the counter 2 tablets twice daily. 13.Prosvent 1 tablet twice daily.  Of note, the patient's potassium has been discontinued this admission secondary to hyperkalemia on admission.  This will be reevaluated as outlined above.  DISPOSITION:  The patient was seen and examined by Dr. Graciela Husbands on February 08, 2011, and considered stable for discharge.  DURATION OF DISCHARGE ENCOUNTER:  35 minutes.     Gypsy Balsam, RN,BSN   ______________________________ Duke Salvia, MD, [facility]    AS/MEDQ  D:  02/08/2011  T:  02/08/2011  Job:  [id number]  cc:   Learta Codding, MD,FACC Ernestine Conrad, MD  Electronically Signed by Gypsy Balsam RNBSN on 02/10/2011 02:21:04 PM Electronically Signed by Sherryl Manges MD [facility] on 03/07/2011 04:01:34 PM

## 2011-03-08 NOTE — Telephone Encounter (Signed)
I spoke with the patient. I offered him an appointment tomorrow to see Kristin/Paula for a device check. He states that his daughter who could bring him is in the hospital. He will come on Monday 03/14/11 @ 4:30pm.

## 2011-03-14 ENCOUNTER — Encounter: Payer: Self-pay | Admitting: Internal Medicine

## 2011-03-14 ENCOUNTER — Ambulatory Visit (INDEPENDENT_AMBULATORY_CARE_PROVIDER_SITE_OTHER): Payer: Medicare Other | Admitting: *Deleted

## 2011-03-14 DIAGNOSIS — I428 Other cardiomyopathies: Secondary | ICD-10-CM

## 2011-03-14 LAB — ICD DEVICE OBSERVATION

## 2011-03-14 NOTE — Progress Notes (Signed)
LV diaphramatic stim--changed output/pulse width/vector.

## 2011-04-04 ENCOUNTER — Ambulatory Visit (INDEPENDENT_AMBULATORY_CARE_PROVIDER_SITE_OTHER): Payer: Medicare Other | Admitting: Cardiology

## 2011-04-04 ENCOUNTER — Encounter: Payer: Self-pay | Admitting: Cardiology

## 2011-04-04 VITALS — BP 117/79 | HR 74 | Ht 72.0 in | Wt 228.0 lb

## 2011-04-04 DIAGNOSIS — Z95810 Presence of automatic (implantable) cardiac defibrillator: Secondary | ICD-10-CM

## 2011-04-04 DIAGNOSIS — I472 Ventricular tachycardia: Secondary | ICD-10-CM

## 2011-04-04 DIAGNOSIS — R0602 Shortness of breath: Secondary | ICD-10-CM

## 2011-04-04 DIAGNOSIS — I771 Stricture of artery: Secondary | ICD-10-CM

## 2011-04-04 DIAGNOSIS — I2510 Atherosclerotic heart disease of native coronary artery without angina pectoris: Secondary | ICD-10-CM

## 2011-04-04 DIAGNOSIS — I5022 Chronic systolic (congestive) heart failure: Secondary | ICD-10-CM

## 2011-04-04 LAB — PROTIME-INR

## 2011-04-04 MED ORDER — CARVEDILOL 6.25 MG PO TABS: 6.2500 mg | ORAL_TABLET | Freq: Two times a day (BID) | ORAL | Status: DC

## 2011-04-04 NOTE — Assessment & Plan Note (Signed)
On Coumadin therapy. 

## 2011-04-04 NOTE — Assessment & Plan Note (Addendum)
On medical therapy. However the patient has had no improvement in dyspnea after CRT-D implantation and I will increase Coreg to 6.25 minutes by mouth twice a day. We will also obtain a BNP level. Pending BNP level the patient may need further diuresis. We'll review chest x-ray also to make sure lead placement particularly LV lead is in proper position.

## 2011-04-04 NOTE — Progress Notes (Signed)
History of present illness:  The patient is a 77 year old male with history of coronary arteries and LV dysfunction with an ejection fraction of 20%. The patient is status post cardiac catheterization recently and was found to have severe right coronary disease but unchanged from prior cardiac catheterization and nonobstructive stenosis of the left main, LAD and left circumflex coronary artery. He had mildly elevated left ventricular intracardiac pressures and no pulmonary hypertension. Wedge pressure was 24-27 mm of mercury. The patient has a chronic left bundle branch block with a QRS duration of 162 ms. The patient was referred to Dr. Graciela Husbands for CRT-D implantation, the patient underwent dual-chamber defibrillator implantation of ventricular lead placement 02/08/2011. Unfortunate the patient does not report any improvement in symptoms of dyspnea and remains in NYHA class IIb/III. He states that he still short of breath on exertion. He has no evidence of volume overload however. He had some palpitations previously but this has now resolved. He reports mild lower extremity edema. He has a prior history of syncope but this has not reoccurred. The patient has underlying mild Parkinson's disease.  Allergies, family history and social history: As documented in chart and reviewed  Medications: Documented and reviewed in chart.  Past medical history: Reviewed and see problem list below   Review of systems: No nausea or vomiting. No fever or chills. No melena hematochezia. No substernal chest pain. No orthopnea PND. No visual changes predominately left-hand tremor.   Physical examination : Vital signs documented below General: Well-nourished white male with parkinsonian features and in no distress HEENT: EOMI, PERRLA, normal carotid upstroke and no carotid bruits. No thyromegaly nonnodular thyroid Lungs: Clear breath sounds bilaterally with no wheezing Chest: Well-healed pocket site of the defibrillator  in the left subclavicular area Heart: Regular rate and rhythm with normal S1-S2 and no murmur rubs or gallops Abdomen: Soft and nontender with no rebound or guarding and good bowel sounds Extremity exam: No cyanosis or clubbing there is mild edema Neurologic: Alert and oriented grossly nonfocal Psychiatric: Parkinsonian features Vascular exam: Normal peripheral vascular exam

## 2011-04-04 NOTE — Patient Instructions (Signed)
   Increase Coreg to 6.25mg  twice a day   Your physician recommends that you go to the [facility] for lab work for BNP, BMET, & PT/INR If the results of your test are normal or stable, you will receive a letter.  If they are abnormal, the nurse will contact you by phone. Your physician wants you to follow up in: 6 months.  You will receive a reminder letter in the mail one-two months in advance.  If you don't receive a letter, please call our office to schedule the follow up appointment

## 2011-04-04 NOTE — Progress Notes (Signed)
Patient states BP has not been dropping like before.

## 2011-04-27 ENCOUNTER — Other Ambulatory Visit: Payer: Self-pay | Admitting: *Deleted

## 2011-04-27 ENCOUNTER — Telehealth: Payer: Self-pay | Admitting: *Deleted

## 2011-04-27 DIAGNOSIS — I509 Heart failure, unspecified: Secondary | ICD-10-CM

## 2011-04-27 DIAGNOSIS — R609 Edema, unspecified: Secondary | ICD-10-CM

## 2011-04-27 MED ORDER — FUROSEMIDE 40 MG PO TABS: 40.0000 mg | ORAL_TABLET | Freq: Two times a day (BID) | ORAL | Status: DC

## 2011-04-27 NOTE — Telephone Encounter (Signed)
Message copied by Eustace Moore on Wed Apr 27, 2011  2:52 PM ------      Message from: Learta Codding      Created: Sun Apr 17, 2011  5:08 AM       Follow up in 6 months.

## 2011-05-24 ENCOUNTER — Encounter: Payer: Self-pay | Admitting: Internal Medicine

## 2011-05-24 ENCOUNTER — Ambulatory Visit (INDEPENDENT_AMBULATORY_CARE_PROVIDER_SITE_OTHER): Payer: Medicare Other | Admitting: Internal Medicine

## 2011-05-24 DIAGNOSIS — I428 Other cardiomyopathies: Secondary | ICD-10-CM

## 2011-05-24 DIAGNOSIS — Z95810 Presence of automatic (implantable) cardiac defibrillator: Secondary | ICD-10-CM

## 2011-05-24 DIAGNOSIS — I5022 Chronic systolic (congestive) heart failure: Secondary | ICD-10-CM

## 2011-05-24 LAB — ICD DEVICE OBSERVATION
ATRIAL PACING ICD: 24 pct
BATTERY VOLTAGE: 3.2 V
HV IMPEDENCE: 49 Ohm
LV LEAD IMPEDENCE ICD: 703 Ohm
RV LEAD IMPEDENCE ICD: 532 Ohm
VENTRICULAR PACING ICD: 98.7 pct

## 2011-05-24 NOTE — Assessment & Plan Note (Signed)
Continue his current medications; I have encouraged him to try to lose 5 pounds as it seems to be a degree of volume overload. He'll do this by taking extra Lasix in the day.

## 2011-05-24 NOTE — Assessment & Plan Note (Signed)
I have encouraged him to try to increase his diuretic use I taken twice a day as well as decrease his fluid intake.  We'll obtain a copy of the chest x-ray to look for location left ventricular lead. He visited his physician is stable, we'll undertake an AV optimization echo.

## 2011-05-24 NOTE — Progress Notes (Signed)
  HPI  Thomas [name] is a 77 y.o. male Seen in followup for ischemic and nonischemic cardiomyopathy and is status post CRT implantation August 2012 for congestive heart failure in the setting of left bundle branch block.  The patient has had no significant benefit since CRT implantation. A chest x-ray was obtained; however, it is a more evening and we have no way of looking at the film will try and get it from there.  Because of fluid overload, may increase his diuretics so he does not like to take it more than once a day.  Past Medical History  Diagnosis Date  . Coronary artery disease     80% distal RCA stenosis.  . Thyroid disease     HYPOTHYROIDISM  . Nontoxic multinodular goiter   . Psychosexual dysfunction with inhibited sexual excitement     ERECTILE DYSFUNCTION  . Hypertrophy of prostate without urinary obstruction and other lower urinary tract symptoms (LUTS)     BPH  . Hypertension   . Wide-complex tachycardia     Past Surgical History  Procedure Date  . Cardiac catheterization     Current Outpatient Prescriptions  Medication Sig Dispense Refill  . amantadine (SYMMETREL) 100 MG capsule Take 100 mg by mouth daily.       . carvedilol (COREG) 6.25 MG tablet Take 1 tablet (6.25 mg total) by mouth 2 (two) times daily with a meal.  60 tablet  6  . DULoxetine (CYMBALTA) 30 MG capsule Take 30 mg by mouth daily.        . furosemide (LASIX) 40 MG tablet Take 1 tablet (40 mg total) by mouth 2 (two) times daily. REPEAT LAB WORK(BMET) IN 3-4 WEEKS AROUND 05/18/11 AT [facility]  60 tablet  3  . lisinopril (PRINIVIL,ZESTRIL) 5 MG tablet Take 5 mg by mouth daily.        . Multiple Vitamins-Minerals (ICAPS MV PO) Take 2 by mouth twice daily       . nitroGLYCERIN (NITROSTAT) 0.4 MG SL tablet Place 0.4 mg under the tongue every 5 (five) minutes as needed.        . NON FORMULARY Take 1 capsule by mouth 2 (two) times daily. Prosvent(OTC)        . Omega-3 Fatty Acids (FISH OIL) 1000 MG  CAPS Take one by mouth twice daily        . omeprazole (PRILOSEC OTC) 20 MG tablet Take 20 mg by mouth daily.        . sildenafil (VIAGRA) 100 MG tablet Take 100 mg by mouth as needed.        . simvastatin (ZOCOR) 20 MG tablet Take 20 mg by mouth at bedtime.        Marland Kitchen warfarin (COUMADIN) 5 MG tablet Take 5 mg by mouth daily. Managed by PMD.        No Known Allergies  Review of Systems negative except from HPI and PMH  Physical Exam Well developed and well nourished in no acute distress HENT normal E scleral and icterus clear Neck Supple JVP .10 ; carotids brisk and full Clear to ausculation Regular rate and rhythm, 2/6 murmur at right sternal border Soft with active bowel sounds No clubbing cyanosis 2+ and pitting Edema Alert and oriented, grossly normal motor and sensory function Skin Warm and Dry  ECG demonstrates P. Synchronous pacing with a QRS duration of 136 compared to 162 prior to implantation  Assessment and  Plan

## 2011-05-24 NOTE — Patient Instructions (Signed)
Your physician has recommended you make the following change in your medication:  1) take your current dose of lasix, just take one tablet in the morning and one tablet in the evening.  We will be in touch with you once we receive your chest x-ray films. Once these are reviewed, we will let you know if we need to proceed with further testing.

## 2011-05-24 NOTE — Assessment & Plan Note (Signed)
The patient's device was interrogated and the information was fully reviewed.  The device was reprogrammed to  Maximize longevity 

## 2011-06-13 ENCOUNTER — Telehealth: Payer: Self-pay | Admitting: Internal Medicine

## 2011-06-13 NOTE — Telephone Encounter (Addendum)
Called & spoke with Betsy/Radiology Dept, asked her to send another copy of pt's last Chest  Xray on CD to me,she is going to send through [facility] today to my attention, also sent Staff Message To Margaretmary Dys to let her know also. 06/13/11/KM  06/15/11 CD Received from [facility] gave to [facility].

## 2011-06-16 ENCOUNTER — Telehealth: Payer: Self-pay | Admitting: Internal Medicine

## 2011-06-16 DIAGNOSIS — I509 Heart failure, unspecified: Secondary | ICD-10-CM

## 2011-06-16 NOTE — Telephone Encounter (Signed)
Chest x-ray from either was reviewed on disc. Lead position seemed stable compared to post implant x-ray. AV optimization may be of benefit. I will discuss with him

## 2011-06-16 NOTE — Telephone Encounter (Signed)
New problem:  Per [name], status of xray results.

## 2011-06-16 NOTE — Telephone Encounter (Signed)
I spoke with the patient's daughter. I explained to her that we have finally received the chest x-ray film for Dr. Graciela Husbands to review. I advised it just came this week and that Dr. Odessa Fleming first day in the office is this afternoon. I will try to have him look at this today, or tomorrow at the latest. I inquired as to how Mr. Thomas [name] is doing. She states that he is very weak and sounds like he might be having some fluid buildup in his lungs. I asked if he has been weighing himself at home. Per [name], the patient does do this. She is not aware of any significant change in his weight. She did state that she has not seen the patient in a few days as she was out of town. She does not know if she will be able to see him today as she will be working late today. I have asked that if she speaks to the patient to find out from him how stable his weight has been and does he have any edema. She is agreeable. I will call her back after the x-ray is reviewed.

## 2011-06-17 NOTE — Telephone Encounter (Signed)
Dr. Graciela Husbands spoke with the patient's daughter. Will pursue an AV optimization echo at the end of next week. Will order and have the PCC's call to schedule.

## 2011-06-17 NOTE — Telephone Encounter (Signed)
Fu call Pt 's daughter calling back

## 2011-06-22 ENCOUNTER — Other Ambulatory Visit: Payer: Self-pay | Admitting: *Deleted

## 2011-06-22 MED ORDER — SIMVASTATIN 20 MG PO TABS: 20.0000 mg | ORAL_TABLET | Freq: Every day | ORAL | Status: DC

## 2011-06-24 ENCOUNTER — Ambulatory Visit (HOSPITAL_COMMUNITY): Payer: Medicare Other | Attending: Cardiovascular Disease | Admitting: Radiology

## 2011-06-24 DIAGNOSIS — R5381 Other malaise: Secondary | ICD-10-CM | POA: Insufficient documentation

## 2011-06-24 DIAGNOSIS — I428 Other cardiomyopathies: Secondary | ICD-10-CM | POA: Insufficient documentation

## 2011-06-24 DIAGNOSIS — I10 Essential (primary) hypertension: Secondary | ICD-10-CM | POA: Insufficient documentation

## 2011-06-24 DIAGNOSIS — I509 Heart failure, unspecified: Secondary | ICD-10-CM

## 2011-06-24 DIAGNOSIS — R0989 Other specified symptoms and signs involving the circulatory and respiratory systems: Secondary | ICD-10-CM | POA: Insufficient documentation

## 2011-06-24 DIAGNOSIS — R0609 Other forms of dyspnea: Secondary | ICD-10-CM | POA: Insufficient documentation

## 2011-06-24 DIAGNOSIS — I2510 Atherosclerotic heart disease of native coronary artery without angina pectoris: Secondary | ICD-10-CM | POA: Insufficient documentation

## 2011-06-30 ENCOUNTER — Telehealth: Payer: Self-pay | Admitting: *Deleted

## 2011-06-30 NOTE — Telephone Encounter (Signed)
Complain of having more SOB the last 2 days.  Gained about 4 lbs in 36 hours per pt.  Stated he was down a pound this morning though.  Questions if he can double up on fluid med.  Did recently see Dr. Graciela Husbands related to his device & had echo on 1/11.

## 2011-07-04 NOTE — Telephone Encounter (Signed)
Yes that would be fine - can increase Lasix to 80mg  PO BID x 3 days then change to 60mg  PO qam and 40 mg PO qpm ( now taking 40 mg BO BID). Needs RN visit in 7-10 days when I am there for vital sand weight. Please reinforce patient to weigh himself daily and write this down and bring records to RN visit. Also refer that day for BMET and BNP.

## 2011-07-04 NOTE — Telephone Encounter (Signed)
Patient notified of below.  Nurse visit scheduled for Tuesday, 1/29.

## 2011-07-12 ENCOUNTER — Ambulatory Visit (INDEPENDENT_AMBULATORY_CARE_PROVIDER_SITE_OTHER): Payer: Medicare Other | Admitting: *Deleted

## 2011-07-12 ENCOUNTER — Encounter: Payer: Self-pay | Admitting: *Deleted

## 2011-07-12 DIAGNOSIS — I10 Essential (primary) hypertension: Secondary | ICD-10-CM

## 2011-07-12 DIAGNOSIS — I5022 Chronic systolic (congestive) heart failure: Secondary | ICD-10-CM

## 2011-07-12 DIAGNOSIS — R0602 Shortness of breath: Secondary | ICD-10-CM

## 2011-07-12 DIAGNOSIS — I428 Other cardiomyopathies: Secondary | ICD-10-CM

## 2011-07-12 NOTE — Progress Notes (Signed)
Patient came for nurse visit today per telephone message from 06/30/11. Patient still c/o sob. Patient has taken medications today. No c/o chest pain, dizziness. No edema noted in lower extremities. No problems with medications. Patient given lab order to go to [facility] for BMET/BNP.

## 2011-07-12 NOTE — Progress Notes (Signed)
Will await review of labs.   Thanks,   Gene

## 2011-09-20 ENCOUNTER — Other Ambulatory Visit: Payer: Self-pay | Admitting: *Deleted

## 2011-09-20 MED ORDER — FUROSEMIDE 40 MG PO TABS: 60.0000 mg | ORAL_TABLET | Freq: Two times a day (BID) | ORAL | Status: DC

## 2011-10-03 ENCOUNTER — Ambulatory Visit (INDEPENDENT_AMBULATORY_CARE_PROVIDER_SITE_OTHER): Payer: Medicare Other | Admitting: *Deleted

## 2011-10-03 ENCOUNTER — Encounter: Payer: Self-pay | Admitting: Internal Medicine

## 2011-10-03 DIAGNOSIS — I5022 Chronic systolic (congestive) heart failure: Secondary | ICD-10-CM

## 2011-10-03 DIAGNOSIS — Z95810 Presence of automatic (implantable) cardiac defibrillator: Secondary | ICD-10-CM

## 2011-10-03 DIAGNOSIS — I428 Other cardiomyopathies: Secondary | ICD-10-CM

## 2011-10-03 LAB — ICD DEVICE OBSERVATION
AL IMPEDENCE ICD: 399 Ohm
ATRIAL PACING ICD: 24.96 pct
CHARGE TIME: 8.147 s
LV LEAD IMPEDENCE ICD: 608 Ohm
RV LEAD THRESHOLD: 0.875 V
TOT-0001: 1
TOT-0006: 20120827000000
TZAT-0001SLOWVT: 2
TZAT-0002ATACH: NEGATIVE
TZAT-0002FASTVT: NEGATIVE
TZAT-0002SLOWVT: NEGATIVE
TZAT-0012ATACH: 150 ms
TZAT-0012ATACH: 150 ms
TZAT-0018SLOWVT: NEGATIVE
TZAT-0019FASTVT: 8 V
TZAT-0019SLOWVT: 8 V
TZAT-0020ATACH: 1.5 ms
TZAT-0020ATACH: 1.5 ms
TZAT-0020ATACH: 1.5 ms
TZAT-0020FASTVT: 1.5 ms
TZAT-0020SLOWVT: 1.5 ms
TZON-0003ATACH: 350 ms
TZON-0003VSLOWVT: 380 ms
TZON-0004SLOWVT: 16
TZON-0005SLOWVT: 12
TZST-0001ATACH: 4
TZST-0001FASTVT: 3
TZST-0001SLOWVT: 4
TZST-0001SLOWVT: 5
TZST-0002ATACH: NEGATIVE
TZST-0002FASTVT: NEGATIVE
TZST-0002FASTVT: NEGATIVE
TZST-0002FASTVT: NEGATIVE
TZST-0002SLOWVT: NEGATIVE
TZST-0002SLOWVT: NEGATIVE
VENTRICULAR PACING ICD: 67.71 pct
VF: 0

## 2011-10-03 NOTE — Progress Notes (Signed)
icd check in clinic  

## 2011-10-11 IMAGING — CR DG CHEST 2V
2 series · 2 of 2 positions shown · non-contrast
Comparison: None.

CLINICAL DATA: Chest pain, shortness of breath

CHEST - 2 VIEW

[w chest pa]
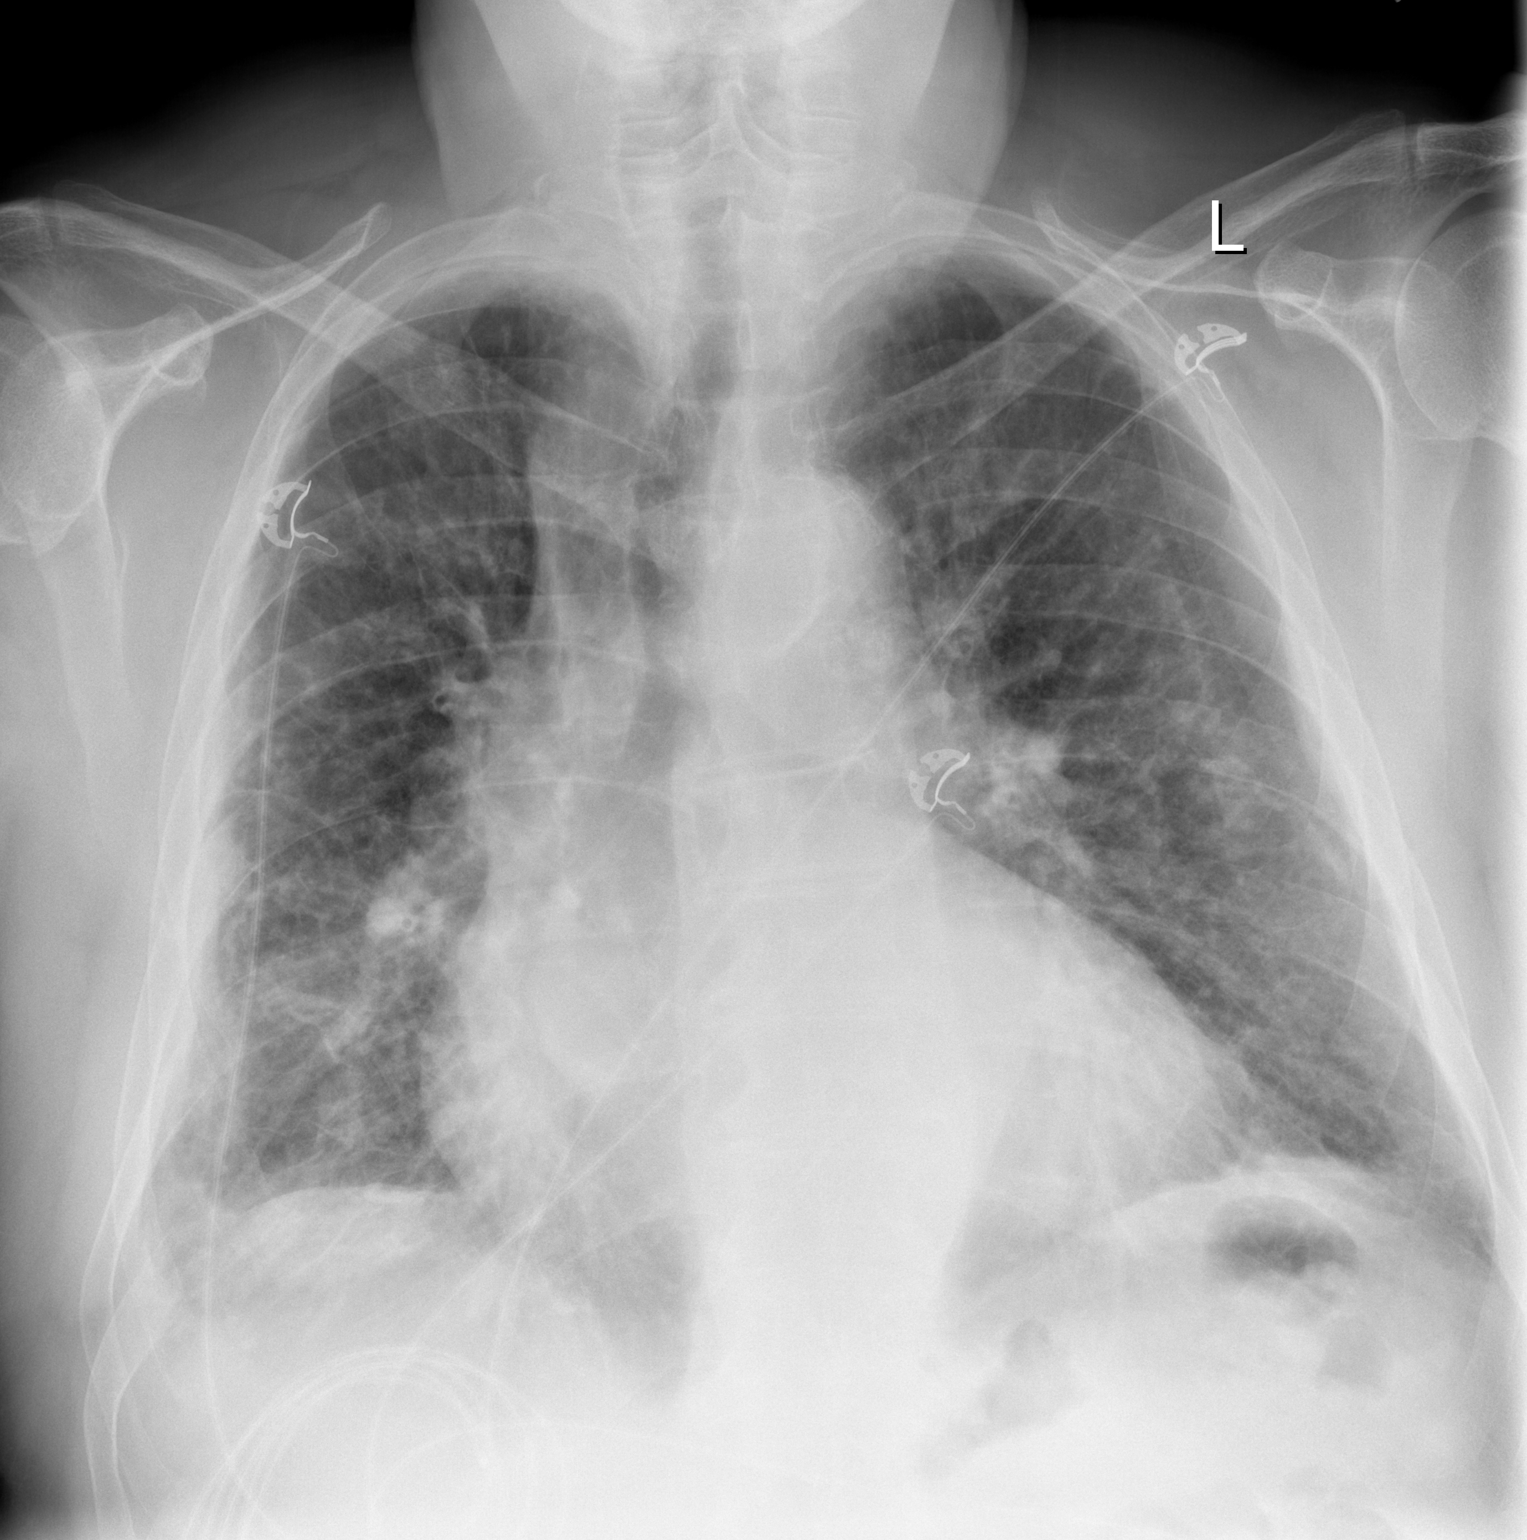

[w chest lat]
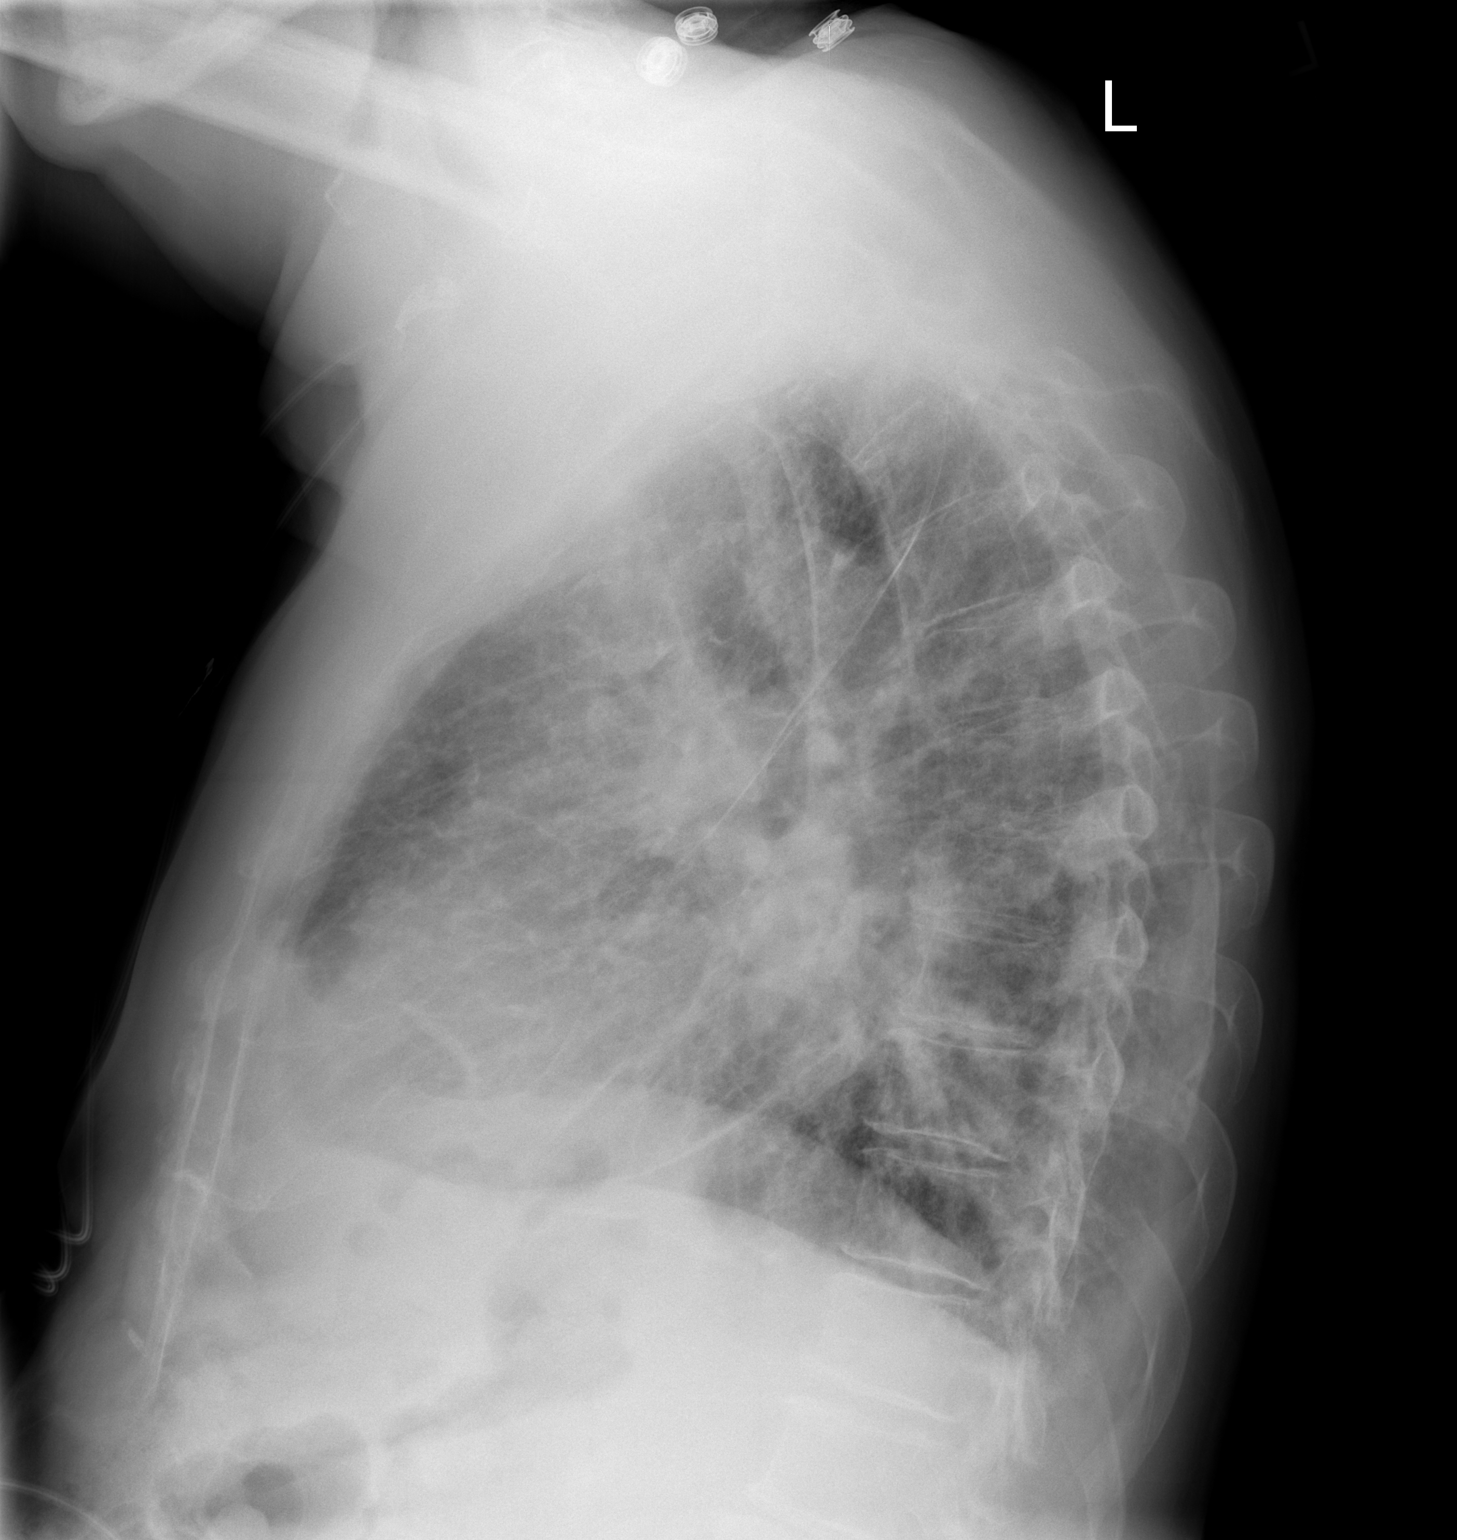

[2 of 2 positions shown; findings below may reference images not displayed]

FINDINGS: There is cardiomegaly and pulmonary edema with small
bilateral pleural effusions.
IMPRESSION: Cardiomegaly, pulmonary edema, and small bilateral pleural
effusions.

## 2011-10-13 ENCOUNTER — Ambulatory Visit (INDEPENDENT_AMBULATORY_CARE_PROVIDER_SITE_OTHER): Payer: Medicare Other | Admitting: Cardiology

## 2011-10-13 ENCOUNTER — Encounter: Payer: Self-pay | Admitting: Cardiology

## 2011-10-13 VITALS — BP 117/78 | HR 75 | Ht 72.0 in | Wt 232.0 lb

## 2011-10-13 DIAGNOSIS — Z95810 Presence of automatic (implantable) cardiac defibrillator: Secondary | ICD-10-CM

## 2011-10-13 DIAGNOSIS — I2510 Atherosclerotic heart disease of native coronary artery without angina pectoris: Secondary | ICD-10-CM

## 2011-10-13 DIAGNOSIS — I5022 Chronic systolic (congestive) heart failure: Secondary | ICD-10-CM

## 2011-10-13 DIAGNOSIS — I771 Stricture of artery: Secondary | ICD-10-CM

## 2011-10-13 DIAGNOSIS — I472 Ventricular tachycardia: Secondary | ICD-10-CM

## 2011-10-13 DIAGNOSIS — G20 Parkinson's disease: Secondary | ICD-10-CM

## 2011-10-13 MED ORDER — LISINOPRIL 5 MG PO TABS: 2.5000 mg | ORAL_TABLET | Freq: Every day | ORAL | Status: DC

## 2011-10-13 MED ORDER — FUROSEMIDE 40 MG PO TABS: 40.0000 mg | ORAL_TABLET | Freq: Every day | ORAL | Status: DC

## 2011-10-13 MED ORDER — CARVEDILOL 6.25 MG PO TABS: 3.1250 mg | ORAL_TABLET | Freq: Two times a day (BID) | ORAL | Status: DC

## 2011-10-13 NOTE — Patient Instructions (Signed)
   Decrease Lisinopril to 2.5mg  daily (1/2 tab of 5mg  tablet)  Decrease Coreg to 3.125mg  twice a day (1/2 tab of 6.25mg  tablet)  Decrease Lasix to 40mg  daily - may take 40mg  twice a day x 3 days only intermittently for weight gain greater than 2-3 pounds.   Your physician wants you to follow up in: 6 months.  You will receive a reminder letter in the mail one-two months in advance.  If you don't receive a letter, please call our office to schedule the follow up appointment

## 2011-10-15 ENCOUNTER — Encounter: Payer: Self-pay | Admitting: Cardiology

## 2011-10-15 DIAGNOSIS — G20 Parkinson's disease: Secondary | ICD-10-CM | POA: Insufficient documentation

## 2011-10-15 DIAGNOSIS — Z950 Presence of cardiac pacemaker: Secondary | ICD-10-CM | POA: Insufficient documentation

## 2011-10-15 NOTE — Progress Notes (Signed)
Thomas Bottoms, MD, [facility] ABIM Board Certified in Adult Cardiovascular Medicine,Internal Medicine and Critical Care Medicine    CC: Followup patient with nonischemic cardiac myopathy and status post CRT-D  HPI:  The patient reports no evidence of volume overload.  He states that his weakness is beginning to improve.  He feels that he is less short of breath since his visit in October of 2012.  He is now working more and is participating in exercise.  He take regular walks.  However is disappointed that he has not lost any weight.  He denies any chest pain.  He has not used any supplemental nitroglycerin.  Previously he used to get chest pain on walking uphill but this does not happen anymore.  He still develops some shortness of breath upon heavy exertion.  The patient reports no presyncope or syncope.  Warfarin has been discontinued.    PMH: reviewed and listed in Problem List in Electronic Records (and see below) Past Medical History  Diagnosis Date  . Coronary artery disease     80% distal RCA stenosis.  . Thyroid disease     HYPOTHYROIDISM  . Nontoxic multinodular goiter   . Psychosexual dysfunction with inhibited sexual excitement     ERECTILE DYSFUNCTION  . Hypertrophy of prostate without urinary obstruction and other lower urinary tract symptoms (LUTS)     BPH  . Hypertension   . Wide-complex tachycardia   . Parkinson's disease   . Status post biventricular cardiac pacemaker insertion     Status post CRT-D.   Past Surgical History  Procedure Date  . Cardiac catheterization     Allergies/SH/FHX : available in Electronic Records for review  No Known Allergies History   Social History  . Marital Status: Widowed    Spouse Name: N/A    Number of Children: N/A  . Years of Education: N/A   Occupational History  . RETIRED    Social History Main Topics  . Smoking status: Former Smoker -- 1.0 packs/day for 45 years    Types: Cigarettes    Quit date: 06/14/1995  .  Smokeless tobacco: Never Used  . Alcohol Use: No  . Drug Use: No  . Sexually Active: Not on file   Other Topics Concern  . Not on file   Social History Narrative  . No narrative on file   Family History  Problem Relation Age of Onset  . Heart attack Mother   . Thyroid disease Neg Hx   . Coronary artery disease Other     Medications: Current Outpatient Prescriptions  Medication Sig Dispense Refill  . amantadine (SYMMETREL) 100 MG capsule Take 100 mg by mouth daily.       . carvedilol (COREG) 6.25 MG tablet Take 0.5 tablets (3.125 mg total) by mouth 2 (two) times daily.      . DULoxetine (CYMBALTA) 30 MG capsule Take 30 mg by mouth daily.        . furosemide (LASIX) 40 MG tablet Take 1 tablet (40 mg total) by mouth daily.      Marland Kitchen lisinopril (PRINIVIL,ZESTRIL) 5 MG tablet Take 0.5 tablets (2.5 mg total) by mouth daily.      . Multiple Vitamins-Minerals (ICAPS MV PO) Take 2 by mouth twice daily       . nitroGLYCERIN (NITROSTAT) 0.4 MG SL tablet Place 0.4 mg under the tongue every 5 (five) minutes as needed.        . NON FORMULARY Take 1 capsule by mouth 2 (  two) times daily. Prosvent(OTC)        . Omega-3 Fatty Acids (FISH OIL) 1000 MG CAPS Take one by mouth twice daily        . omeprazole (PRILOSEC OTC) 20 MG tablet Take 20 mg by mouth daily.        . sildenafil (VIAGRA) 100 MG tablet Take 100 mg by mouth as needed.        . simvastatin (ZOCOR) 20 MG tablet Take 1 tablet (20 mg total) by mouth at bedtime.  30 tablet  6  . warfarin (COUMADIN) 5 MG tablet Take 5 mg by mouth daily. Managed by PMD.        ROS: No nausea or vomiting. No fever or chills.No melena or hematochezia.No bleeding.No claudication  Physical Exam: BP 117/78  Pulse 75  Ht 6' (1.829 m)  Wt 232 lb (105.235 kg)  BMI 31.47 kg/m2 General:Overweight white male but in no apparent distress.Poker face with tremor consistent with Parkinson's disease Neck:Normal carotid upstroke and no carotid bruit. Lungs:Clear  breath sounds bilaterally with no wheezing Cardiac:Regular rate and rhythm with normal S1 and S2 no definite S3.  No significant or pathological murmurs Vascular:No edema.  Normal distal pulses Skin:Warm and dry Physcologic:Normal affect.  12lead ECG:Not obtained Limited bedside ECHO:N/A No images are attached to the encounter.   Assessment and Plan  Wide-complex tachycardia No recurrence of arrhythmia.  Patient is status post CRT-D regardless.  Of note is that patient has discontinued warfarin.  Subclavian arterial stenosis No symptoms of subclavian steal.  Chronic systolic heart failure BNP level within normal limits.  However the patient's blood pressure has been running low and has been associated with some dizziness.  We will reduce his lisinopril to 2.5 mg a day, Coreg to 3.125 Milligram.  Daily and Lasix to 40 Milligram. By mouth daily.  Biventricular ICD (implantable cardiac defibrillator) in place No defibrillator discharges.  CAD, NATIVE VESSEL Status post cardiac catheterization with 80% stenosis.  No recurrent chest pain.  Nonischemic cardiomyopathy    Patient Active Problem List  Diagnoses  . GOITER, MULTINODULAR  . HYPERTHYROIDISM  . ERECTILE DYSFUNCTION  . DEPRESSION, SITUATIONAL, MILD  . HYPERTENSION  . CAD, NATIVE VESSEL  . BUNDLE BRANCH BLOCK, LEFT  . BENIGN PROSTATIC HYPERTROPHY  . Wide-complex tachycardia  . Fatigue  . Cardiomyopathy  . Bruit  . Chronic systolic heart failure  . Biventricular ICD (implantable cardiac defibrillator) in place  . Subclavian arterial stenosis  . Status post biventricular cardiac pacemaker insertion  . Parkinson's disease

## 2011-10-15 NOTE — Assessment & Plan Note (Signed)
Status post cardiac catheterization with 80% stenosis.  No recurrent chest pain.  Nonischemic cardiomyopathy

## 2011-10-15 NOTE — Assessment & Plan Note (Signed)
No defibrillator discharges. 

## 2011-10-15 NOTE — Assessment & Plan Note (Signed)
BNP level within normal limits.  However the patient's blood pressure has been running low and has been associated with some dizziness.  We will reduce his lisinopril to 2.5 mg a day, Coreg to 3.125 Milligram.  Daily and Lasix to 40 Milligram. By mouth daily.

## 2011-10-15 NOTE — Assessment & Plan Note (Signed)
No recurrence of arrhythmia.  Patient is status post CRT-D regardless

## 2011-10-15 NOTE — Assessment & Plan Note (Signed)
No symptoms of subclavian steal.

## 2011-12-09 ENCOUNTER — Telehealth: Payer: Self-pay | Admitting: Cardiology

## 2011-12-09 NOTE — Telephone Encounter (Signed)
Needs to know if he should be on coumadin or aspirin  [phone]

## 2011-12-11 NOTE — Telephone Encounter (Signed)
Coumadin not asa

## 2011-12-14 NOTE — Telephone Encounter (Signed)
Notified via voicemail

## 2012-01-05 ENCOUNTER — Encounter: Payer: Medicare Other | Admitting: *Deleted

## 2012-01-10 ENCOUNTER — Encounter: Payer: Self-pay | Admitting: *Deleted

## 2012-02-06 ENCOUNTER — Other Ambulatory Visit: Payer: Self-pay | Admitting: *Deleted

## 2012-02-06 MED ORDER — SIMVASTATIN 20 MG PO TABS: 20.0000 mg | ORAL_TABLET | Freq: Every day | ORAL | Status: DC

## 2012-06-15 ENCOUNTER — Encounter: Payer: Self-pay | Admitting: *Deleted

## 2012-08-30 ENCOUNTER — Other Ambulatory Visit: Payer: Self-pay | Admitting: Cardiology

## 2012-08-30 MED ORDER — SIMVASTATIN 20 MG PO TABS: 20.0000 mg | ORAL_TABLET | Freq: Every day | ORAL | Status: DC

## 2012-11-09 ENCOUNTER — Other Ambulatory Visit: Payer: Self-pay | Admitting: *Deleted

## 2012-11-09 MED ORDER — SIMVASTATIN 20 MG PO TABS: 20.0000 mg | ORAL_TABLET | Freq: Every day | ORAL | Status: DC

## 2013-05-03 ENCOUNTER — Telehealth: Payer: Self-pay | Admitting: Cardiology

## 2013-05-03 NOTE — Telephone Encounter (Signed)
Pts daughter called and wanted to know who pt would see if he came back to office. Told her it would be one of the 3 doctors that come to this office. She said that she believes pt is seeing Dr. Nedra Hai in [place] and she would call back with that information later today.

## 2013-05-07 NOTE — Telephone Encounter (Signed)
Contacted pt daughter and she stated that pt is seeing Dr. Nedra Hai in [place].

## 2014-06-01 ENCOUNTER — Inpatient Hospital Stay (HOSPITAL_COMMUNITY)
Admission: AD | Admit: 2014-06-01 | Discharge: 2014-06-15 | DRG: 216 | Disposition: A | Discharge status: Home Health | Payer: Medicare Other | Source: Other Acute Inpatient Hospital | Attending: Cardiology | Admitting: Cardiology

## 2014-06-01 ENCOUNTER — Encounter (HOSPITAL_COMMUNITY): Payer: Self-pay | Admitting: *Deleted

## 2014-06-01 DIAGNOSIS — E785 Hyperlipidemia, unspecified: Secondary | ICD-10-CM | POA: Diagnosis present

## 2014-06-01 DIAGNOSIS — I2584 Coronary atherosclerosis due to calcified coronary lesion: Secondary | ICD-10-CM | POA: Diagnosis present

## 2014-06-01 DIAGNOSIS — I214 Non-ST elevation (NSTEMI) myocardial infarction: Secondary | ICD-10-CM | POA: Diagnosis present

## 2014-06-01 DIAGNOSIS — G20A1 Parkinson's disease without dyskinesia, without mention of fluctuations: Secondary | ICD-10-CM | POA: Diagnosis present

## 2014-06-01 DIAGNOSIS — I447 Left bundle-branch block, unspecified: Secondary | ICD-10-CM | POA: Diagnosis present

## 2014-06-01 DIAGNOSIS — Z950 Presence of cardiac pacemaker: Secondary | ICD-10-CM

## 2014-06-01 DIAGNOSIS — Z8249 Family history of ischemic heart disease and other diseases of the circulatory system: Secondary | ICD-10-CM

## 2014-06-01 DIAGNOSIS — Z95810 Presence of automatic (implantable) cardiac defibrillator: Secondary | ICD-10-CM | POA: Diagnosis present

## 2014-06-01 DIAGNOSIS — E039 Hypothyroidism, unspecified: Secondary | ICD-10-CM | POA: Diagnosis present

## 2014-06-01 DIAGNOSIS — N179 Acute kidney failure, unspecified: Secondary | ICD-10-CM | POA: Diagnosis present

## 2014-06-01 DIAGNOSIS — I5021 Acute systolic (congestive) heart failure: Secondary | ICD-10-CM | POA: Diagnosis present

## 2014-06-01 DIAGNOSIS — Z79899 Other long term (current) drug therapy: Secondary | ICD-10-CM

## 2014-06-01 DIAGNOSIS — Z87891 Personal history of nicotine dependence: Secondary | ICD-10-CM

## 2014-06-01 DIAGNOSIS — D696 Thrombocytopenia, unspecified: Secondary | ICD-10-CM | POA: Diagnosis present

## 2014-06-01 DIAGNOSIS — I429 Cardiomyopathy, unspecified: Secondary | ICD-10-CM | POA: Diagnosis present

## 2014-06-01 DIAGNOSIS — I10 Essential (primary) hypertension: Secondary | ICD-10-CM | POA: Diagnosis present

## 2014-06-01 DIAGNOSIS — I509 Heart failure, unspecified: Secondary | ICD-10-CM

## 2014-06-01 DIAGNOSIS — I5023 Acute on chronic systolic (congestive) heart failure: Secondary | ICD-10-CM | POA: Diagnosis present

## 2014-06-01 DIAGNOSIS — D649 Anemia, unspecified: Secondary | ICD-10-CM | POA: Diagnosis present

## 2014-06-01 DIAGNOSIS — G20 Parkinson's disease: Secondary | ICD-10-CM | POA: Diagnosis present

## 2014-06-01 DIAGNOSIS — I472 Ventricular tachycardia: Secondary | ICD-10-CM | POA: Diagnosis present

## 2014-06-01 DIAGNOSIS — N183 Chronic kidney disease, stage 3 unspecified: Secondary | ICD-10-CM | POA: Diagnosis present

## 2014-06-01 DIAGNOSIS — I4729 Other ventricular tachycardia: Secondary | ICD-10-CM

## 2014-06-01 DIAGNOSIS — Z7982 Long term (current) use of aspirin: Secondary | ICD-10-CM

## 2014-06-01 DIAGNOSIS — I959 Hypotension, unspecified: Secondary | ICD-10-CM | POA: Diagnosis present

## 2014-06-01 DIAGNOSIS — R0902 Hypoxemia: Secondary | ICD-10-CM

## 2014-06-01 DIAGNOSIS — R0602 Shortness of breath: Secondary | ICD-10-CM | POA: Diagnosis present

## 2014-06-01 DIAGNOSIS — I428 Other cardiomyopathies: Secondary | ICD-10-CM

## 2014-06-01 DIAGNOSIS — I4891 Unspecified atrial fibrillation: Secondary | ICD-10-CM | POA: Diagnosis present

## 2014-06-01 DIAGNOSIS — Z7901 Long term (current) use of anticoagulants: Secondary | ICD-10-CM

## 2014-06-01 DIAGNOSIS — I25110 Atherosclerotic heart disease of native coronary artery with unstable angina pectoris: Secondary | ICD-10-CM | POA: Diagnosis not present

## 2014-06-01 DIAGNOSIS — I129 Hypertensive chronic kidney disease with stage 1 through stage 4 chronic kidney disease, or unspecified chronic kidney disease: Secondary | ICD-10-CM | POA: Diagnosis present

## 2014-06-01 DIAGNOSIS — I2510 Atherosclerotic heart disease of native coronary artery without angina pectoris: Secondary | ICD-10-CM | POA: Diagnosis present

## 2014-06-01 DIAGNOSIS — I255 Ischemic cardiomyopathy: Secondary | ICD-10-CM | POA: Diagnosis present

## 2014-06-01 MED ORDER — AMIODARONE HCL IN DEXTROSE 360-4.14 MG/200ML-% IV SOLN
30.0000 mg/h | INTRAVENOUS | Status: DC
  Administered 2014-06-02 – 2014-06-03 (×2): 30 mg/h via INTRAVENOUS
  Filled 2014-06-01 (×7): qty 200

## 2014-06-01 MED ORDER — SODIUM CHLORIDE 0.9 % IJ SOLN
3.0000 mL | Freq: Two times a day (BID) | INTRAMUSCULAR | Status: DC
  Administered 2014-06-03 – 2014-06-04 (×2): 3 mL via INTRAVENOUS

## 2014-06-01 MED ORDER — SODIUM CHLORIDE 0.9 % IJ SOLN
3.0000 mL | INTRAMUSCULAR | Status: DC | PRN
  Administered 2014-06-02: 3 mL via INTRAVENOUS
  Filled 2014-06-01: qty 3

## 2014-06-01 MED ORDER — SODIUM CHLORIDE 0.9 % IV SOLN
250.0000 mL | INTRAVENOUS | Status: DC | PRN
  Administered 2014-06-02: 250 mL via INTRAVENOUS

## 2014-06-01 MED ORDER — ASPIRIN EC 81 MG PO TBEC
81.0000 mg | DELAYED_RELEASE_TABLET | Freq: Every day | ORAL | Status: DC
  Administered 2014-06-02 – 2014-06-09 (×8): 81 mg via ORAL
  Filled 2014-06-01 (×9): qty 1

## 2014-06-01 MED ORDER — FUROSEMIDE 10 MG/ML IJ SOLN
80.0000 mg | Freq: Two times a day (BID) | INTRAMUSCULAR | Status: DC
  Administered 2014-06-02: 80 mg via INTRAVENOUS
  Filled 2014-06-01 (×3): qty 8

## 2014-06-01 MED ORDER — ACETAMINOPHEN 325 MG PO TABS
650.0000 mg | ORAL_TABLET | ORAL | Status: DC | PRN
  Administered 2014-06-06 – 2014-06-15 (×10): 650 mg via ORAL
  Filled 2014-06-01 (×11): qty 2

## 2014-06-01 MED ORDER — ONDANSETRON HCL 4 MG/2ML IJ SOLN: 4.0000 mg | Freq: Four times a day (QID) | INTRAMUSCULAR | Status: DC | PRN

## 2014-06-01 MED ORDER — AMIODARONE HCL IN DEXTROSE 360-4.14 MG/200ML-% IV SOLN
60.0000 mg/h | INTRAVENOUS | Status: AC
  Administered 2014-06-02 (×2): 60 mg/h via INTRAVENOUS
  Filled 2014-06-01: qty 200

## 2014-06-01 MED ORDER — AMIODARONE LOAD VIA INFUSION
150.0000 mg | Freq: Once | INTRAVENOUS | Status: AC
  Administered 2014-06-02: 150 mg via INTRAVENOUS
  Filled 2014-06-01: qty 83.34

## 2014-06-01 NOTE — H&P (Signed)
Admission History and Physical     Patient ID: Thomas [name], MRN: [id number], DOB: [birth year] 80 y.o. Date of Encounter: 06/01/2014, 11:35 PM  Primary Physician: Renato Shin, MD Primary Cardiologist: Previously seen by Lutricia Feil Primary Electrophysiologist:  Caryl Comes  Chief Complaint:  SOB  History of Present Illness: Thomas [name] is a 80 y.o. male with a history of CAD and mostly non-ischemic cardiomypathy, s/p CRT-D, parkinsons disease, who presents with 1 day of SOB.  He presented to [facility] ED, and was found to be in AFib with RVR and with an elevated troponin of 5.  He denies CP or palpitations.  States that a few days ago he noted he was gaining weight, and took additional lasix which did not seem to help.  Acutely SOB since last night.  Lives at home alone, admits to eating high salt foods (TV dinners) most nights.  No Tob or EtOH.  In ED at [facility]:  Na 136 K 4.2 Cl 100 BUN 41 Cr 1.9 BNP 642 Trop I: 5.24 TSH 0.5 INR 3.3 WBC 8 HGB 11.7 PLT 123  CXR with pulmonary edema.  Patient received cardizem x1 and was trasferred.      Past Medical History  Diagnosis Date  . Coronary artery disease     80% distal RCA stenosis.  . Thyroid disease     HYPOTHYROIDISM  . Nontoxic multinodular goiter   . Psychosexual dysfunction with inhibited sexual excitement     ERECTILE DYSFUNCTION  . Hypertrophy of prostate without urinary obstruction and other lower urinary tract symptoms (LUTS)     BPH  . Hypertension   . Wide-complex tachycardia   . Parkinson's disease   . Status post biventricular cardiac pacemaker insertion     Status post CRT-D.     Past Surgical History  Procedure Laterality Date  . Cardiac catheterization        Current Facility-Administered Medications  Medication Dose Route Frequency Provider Last Rate Last Dose  . 0.9 %  sodium chloride infusion  250 mL Intravenous PRN Stephani Police, MD      . acetaminophen (TYLENOL) tablet  650 mg  650 mg Oral Q4H PRN Stephani Police, MD      . amiodarone (NEXTERONE PREMIX) 360 MG/200ML (1.8 mg/mL) IV infusion  60 mg/hr Intravenous Continuous Stephani Police, MD       Followed by  . [START ON 06/02/2014] amiodarone (NEXTERONE PREMIX) 360 MG/200ML (1.8 mg/mL) IV infusion  30 mg/hr Intravenous Continuous Stephani Police, MD      . amiodarone (NEXTERONE) 1.8 mg/mL load via infusion 150 mg  150 mg Intravenous Once Stephani Police, MD      . Derrill Memo ON 06/02/2014] aspirin EC tablet 81 mg  81 mg Oral Daily Stephani Police, MD      . furosemide (LASIX) injection 80 mg  80 mg Intravenous BID Stephani Police, MD      . ondansetron [facility]) injection 4 mg  4 mg Intravenous Q6H PRN Stephani Police, MD      . sodium chloride 0.9 % injection 3 mL  3 mL Intravenous Q12H Stephani Police, MD      . sodium chloride 0.9 % injection 3 mL  3 mL Intravenous PRN Stephani Police, MD          Allergies: No Known Allergies   Social History:  The patient  reports that he quit smoking about 18 years ago. His smoking use included Cigarettes. He has a 45 pack-year smoking history. He has never  used smokeless tobacco. He reports that he does not drink alcohol or use illicit drugs. Lives at home alone  Family History:  The patient's family history includes Coronary artery disease in his other; Heart attack in his mother. There is no history of Thyroid disease.   ROS:  Please see the history of present illness.  All other systems reviewed and negative.   Vital Signs: Blood pressure 89/65, pulse 105, temperature 97.9 F (36.6 C), temperature source Oral, resp. rate 23, height 6' (1.829 m), weight 90.9 kg (200 lb 6.4 oz), SpO2 100 %.  PHYSICAL EXAM: General:  Well nourished, well developed, in no acute distress HEENT: normal Lymph: no adenopathy Neck: Elevated to jaw at 45% Endocrine:  No thryomegaly Cardiac:  Irregularly irregular, no murmur Lungs:  Early wheezing and central airway sounds throughout post cough, bibasilar  rales. Abd: soft, nontender, no hepatomegaly Ext: no edema Musculoskeletal:  No deformities, BUE and BLE strength normal and equal Skin: warm and dry Neuro:  CNs 2-12 intact, no focal abnormalities noted Psych:  Normal affect   EKG:   afib with intrinsic and paced beats, along with runs of NSVT.  Labs:    Radiology/Studies:  CXR 1v pending    ASSESSMENT AND PLAN:   Thomas [name] is a 80 yo M with    1. Acute congestive heart failure, warm and wet. - IV lasix stat and BID - hold home beta blocker - TTE tomorrow  2. AFib with RVR with ventricular ectopy/NSVT - Blood pressures are soft.  Start amio gtt. - Continue coumadin  3. NSTEMI - No chest pain, most likely a type 2 NSTEMI in setting of above. - ASA 81 daily - Theraputic on coumadin so will not start heparin - Trend troponin - Echo for regional wall motion abnormalities  4. Renal Failure - Cr normal in 2012 here.  Will continue to trend as CHF is treated.    Signed,  Stephani Police, MD 06/01/2014, 11:35 PM

## 2014-06-02 ENCOUNTER — Inpatient Hospital Stay (HOSPITAL_COMMUNITY): Payer: Medicare Other

## 2014-06-02 DIAGNOSIS — I472 Ventricular tachycardia: Secondary | ICD-10-CM

## 2014-06-02 DIAGNOSIS — Z95810 Presence of automatic (implantable) cardiac defibrillator: Secondary | ICD-10-CM

## 2014-06-02 DIAGNOSIS — I5023 Acute on chronic systolic (congestive) heart failure: Secondary | ICD-10-CM

## 2014-06-02 DIAGNOSIS — I480 Paroxysmal atrial fibrillation: Secondary | ICD-10-CM

## 2014-06-02 DIAGNOSIS — I4891 Unspecified atrial fibrillation: Secondary | ICD-10-CM

## 2014-06-02 DIAGNOSIS — I059 Rheumatic mitral valve disease, unspecified: Secondary | ICD-10-CM

## 2014-06-02 LAB — CBC
HCT: 34.8 % — ABNORMAL LOW (ref 39.0–52.0)
Hemoglobin: 11.2 g/dL — ABNORMAL LOW (ref 13.0–17.0)
MCH: 28.4 pg (ref 26.0–34.0)
MCHC: 32.2 g/dL (ref 30.0–36.0)
MCV: 88.1 fL (ref 78.0–100.0)
Platelets: 123 10*3/uL — ABNORMAL LOW (ref 150–400)
RBC: 3.95 MIL/uL — ABNORMAL LOW (ref 4.22–5.81)
RDW: 14.1 % (ref 11.5–15.5)
WBC: 7 10*3/uL (ref 4.0–10.5)

## 2014-06-02 LAB — BASIC METABOLIC PANEL
ANION GAP: 11 (ref 5–15)
Anion gap: 14 (ref 5–15)
BUN: 37 mg/dL — ABNORMAL HIGH (ref 6–23)
BUN: 38 mg/dL — AB (ref 6–23)
CALCIUM: 8.6 mg/dL (ref 8.4–10.5)
CALCIUM: 8.3 mg/dL — AB (ref 8.4–10.5)
CO2: 30 mEq/L (ref 19–32)
CO2: 25 mEq/L (ref 19–32)
CREATININE: 1.49 mg/dL — AB (ref 0.50–1.35)
Chloride: 101 mEq/L (ref 96–112)
Chloride: 101 mEq/L (ref 96–112)
Creatinine, Ser: 1.69 mg/dL — ABNORMAL HIGH (ref 0.50–1.35)
GFR calc Af Amer: 43 mL/min — ABNORMAL LOW (ref >90)
GFR calc non Af Amer: 37 mL/min — ABNORMAL LOW (ref >90)
GFR, EST AFRICAN AMERICAN: 50 mL/min — AB (ref >90)
GFR, EST NON AFRICAN AMERICAN: 43 mL/min — AB (ref >90)
GLUCOSE: 111 mg/dL — AB (ref 70–99)
GLUCOSE: 141 mg/dL — AB (ref 70–99)
POTASSIUM: 3.9 meq/L (ref 3.7–5.3)
Potassium: 3.8 mEq/L (ref 3.7–5.3)
SODIUM: 142 meq/L (ref 137–147)
Sodium: 140 mEq/L (ref 137–147)

## 2014-06-02 LAB — URINE MICROSCOPIC-ADD ON

## 2014-06-02 LAB — PROTIME-INR
INR: 2.83 — ABNORMAL HIGH (ref 0.00–1.49)
INR: 2.6 — AB (ref 0.00–1.49)
Prothrombin Time: 30 seconds — ABNORMAL HIGH (ref 11.6–15.2)
Prothrombin Time: 28.1 seconds — ABNORMAL HIGH (ref 11.6–15.2)

## 2014-06-02 LAB — URINALYSIS, ROUTINE W REFLEX MICROSCOPIC
BILIRUBIN URINE: NEGATIVE
Glucose, UA: NEGATIVE mg/dL
Hgb urine dipstick: NEGATIVE
Ketones, ur: NEGATIVE mg/dL
LEUKOCYTES UA: NEGATIVE
Nitrite: POSITIVE — AB
PH: 8 (ref 5.0–8.0)
Protein, ur: NEGATIVE mg/dL
Specific Gravity, Urine: 1.018 (ref 1.005–1.030)
UROBILINOGEN UA: 1 mg/dL (ref 0.0–1.0)

## 2014-06-02 LAB — MRSA PCR SCREENING: MRSA by PCR: NEGATIVE

## 2014-06-02 LAB — TROPONIN I: Troponin I: 8.74 ng/mL (ref <0.30)

## 2014-06-02 LAB — MAGNESIUM: Magnesium: 2 mg/dL (ref 1.5–2.5)

## 2014-06-02 MED ORDER — ROTIGOTINE 2 MG/24HR TD PT24
1.0000 | MEDICATED_PATCH | Freq: Every day | TRANSDERMAL | Status: DC
  Administered 2014-06-02 – 2014-06-15 (×14): 1 via TRANSDERMAL
  Filled 2014-06-02 (×15): qty 1

## 2014-06-02 MED ORDER — SODIUM CHLORIDE 0.9 % IJ SOLN
3.0000 mL | INTRAMUSCULAR | Status: DC | PRN
  Administered 2014-06-04: 3 mL via INTRAVENOUS
  Filled 2014-06-02: qty 3

## 2014-06-02 MED ORDER — WARFARIN - PHARMACIST DOSING INPATIENT: Freq: Every day | Status: DC

## 2014-06-02 MED ORDER — DULOXETINE HCL 30 MG PO CPEP
30.0000 mg | ORAL_CAPSULE | Freq: Every day | ORAL | Status: DC
  Administered 2014-06-02 – 2014-06-15 (×14): 30 mg via ORAL
  Filled 2014-06-02 (×15): qty 1

## 2014-06-02 MED ORDER — ROTIGOTINE 6 MG/24HR TD PT24: 1.0000 | MEDICATED_PATCH | Freq: Every day | TRANSDERMAL | Status: DC

## 2014-06-02 MED ORDER — AMIODARONE IV BOLUS ONLY 150 MG/100ML
150.0000 mg | Freq: Once | INTRAVENOUS | Status: AC
  Administered 2014-06-02: 150 mg via INTRAVENOUS
  Filled 2014-06-02: qty 100

## 2014-06-02 MED ORDER — SODIUM CHLORIDE 0.9 % IJ SOLN
3.0000 mL | Freq: Two times a day (BID) | INTRAMUSCULAR | Status: DC
  Administered 2014-06-02 – 2014-06-05 (×4): 3 mL via INTRAVENOUS

## 2014-06-02 MED ORDER — INFLUENZA VAC SPLIT QUAD 0.5 ML IM SUSY
0.5000 mL | PREFILLED_SYRINGE | INTRAMUSCULAR | Status: DC
  Filled 2014-06-02: qty 0.5

## 2014-06-02 MED ORDER — SODIUM CHLORIDE 0.9 % IV SOLN
INTRAVENOUS | Status: DC
  Administered 2014-06-02 – 2014-06-10 (×4): via INTRAVENOUS

## 2014-06-02 MED ORDER — SODIUM CHLORIDE 0.9 % IV SOLN: 250.0000 mL | INTRAVENOUS | Status: DC

## 2014-06-02 MED ORDER — AMANTADINE HCL 100 MG PO CAPS
100.0000 mg | ORAL_CAPSULE | Freq: Every day | ORAL | Status: DC
  Administered 2014-06-02 – 2014-06-15 (×14): 100 mg via ORAL
  Filled 2014-06-02 (×15): qty 1

## 2014-06-02 MED ORDER — SIMVASTATIN 20 MG PO TABS
20.0000 mg | ORAL_TABLET | Freq: Every day | ORAL | Status: DC
  Administered 2014-06-02 – 2014-06-06 (×5): 20 mg via ORAL
  Filled 2014-06-02 (×6): qty 1

## 2014-06-02 MED ORDER — ROTIGOTINE 4 MG/24HR TD PT24
1.0000 | MEDICATED_PATCH | Freq: Every day | TRANSDERMAL | Status: DC
  Administered 2014-06-02 – 2014-06-15 (×15): 1 via TRANSDERMAL
  Filled 2014-06-02 (×15): qty 1

## 2014-06-02 MED ORDER — WARFARIN SODIUM 5 MG PO TABS
5.0000 mg | ORAL_TABLET | Freq: Once | ORAL | Status: AC
  Administered 2014-06-02: 5 mg via ORAL
  Filled 2014-06-02: qty 1

## 2014-06-02 MED ORDER — DIGOXIN 0.25 MG/ML IJ SOLN
0.1250 mg | Freq: Once | INTRAMUSCULAR | Status: AC
  Administered 2014-06-02: 0.125 mg via INTRAVENOUS
  Filled 2014-06-02 (×2): qty 0.5

## 2014-06-02 MED ORDER — DIGOXIN 0.25 MG/ML IJ SOLN
0.1250 mg | Freq: Once | INTRAMUSCULAR | Status: AC
  Administered 2014-06-03: 0.125 mg via INTRAVENOUS
  Filled 2014-06-02: qty 0.5

## 2014-06-02 MED ORDER — FUROSEMIDE 10 MG/ML IJ SOLN
20.0000 mg | Freq: Once | INTRAMUSCULAR | Status: AC
  Administered 2014-06-02: 20 mg via INTRAVENOUS
  Filled 2014-06-02: qty 2

## 2014-06-02 NOTE — Consult Note (Signed)
ELECTROPHYSIOLOGY CONSULT NOTE  Patient ID: Thomas [name], MRN: [id number], DOB/AGE: 07/26/1933 80 y.o. Admit date: 06/01/2014 Date of Consult: 06/02/2014  Primary Physician: Renato Shin, MD Primary Cardiologist: SK  Chief Complaint:     HPI Thomas [name] is a 80 y.o. male  Admitted with SOB and found to be in AFib  He had sudden onset SOB and LH over the last 2-3 days.  No changes otherwise in his salt laden diet.  He has chronic edema  He has had no chest pain  Has hx of CAD and NICM LBBB for which he underwent CRT-D implantation 2012 and has been lost to followup  At that time Left ventricular ejection fraction was 20% multiple segmental wall motion abnormalities. Left and right heart catheterization was performed which showed severe global and segmental LV dysfunction. However there was severe right coronary artery disease unchanged from prior cardiac catheterization of nonobstructive stenosis of the left main, LAD and left circumflex coronary artery.    He was started on amio last pm;  Tn 8.7 and INR 2.8        Past Medical History  Diagnosis Date  . Coronary artery disease     80% distal RCA stenosis.  . Thyroid disease     HYPOTHYROIDISM  . Nontoxic multinodular goiter   . Psychosexual dysfunction with inhibited sexual excitement     ERECTILE DYSFUNCTION  . Hypertrophy of prostate without urinary obstruction and other lower urinary tract symptoms (LUTS)     BPH  . Hypertension   . Wide-complex tachycardia   . Parkinson's disease   . Status post biventricular cardiac pacemaker insertion     Status post CRT-D.      Surgical History:  Past Surgical History  Procedure Laterality Date  . Cardiac catheterization       Home Meds: Prior to Admission medications   Medication Sig Start Date End Date Taking? Authorizing Provider  amantadine (SYMMETREL) 100 MG capsule Take 100 mg by mouth 2 (two) times daily.    Yes Historical Provider, MD    DULoxetine (CYMBALTA) 30 MG capsule Take 30 mg by mouth daily.     Yes Historical Provider, MD  metoprolol tartrate (LOPRESSOR) 25 MG tablet Take 12.5 mg by mouth 2 (two) times daily.   Yes Historical Provider, MD  Multiple Vitamins-Minerals (ICAPS MV PO) Take 2 by mouth twice daily    Yes Historical Provider, MD  Omega-3 Fatty Acids (FISH OIL) 1000 MG CAPS Take one by mouth twice daily     Yes Historical Provider, MD  rotigotine (NEUPRO) 6 MG/24HR Place 1 patch onto the skin daily.   Yes Historical Provider, MD  simvastatin (ZOCOR) 20 MG tablet Take 1 tablet (20 mg total) by mouth at bedtime. 11/09/12  Yes Donney Dice, PA-C  torsemide (DEMADEX) 100 MG tablet Take 50-100 mg by mouth daily as needed (edema).    Yes Historical Provider, MD  vitamin C (ASCORBIC ACID) 500 MG tablet Take 500 mg by mouth.   Yes Historical Provider, MD  warfarin (COUMADIN) 3 MG tablet Take 6 mg by mouth daily.   Yes Historical Provider, MD  nitroGLYCERIN (NITROSTAT) 0.4 MG SL tablet Place 0.4 mg under the tongue every 5 (five) minutes as needed.      Historical Provider, MD    Inpatient Medications:  . amantadine  100 mg Oral Daily  . aspirin EC  81 mg Oral Daily  . DULoxetine  30 mg Oral Daily  . [START  ON 06/03/2014] Influenza vac split quadrivalent PF  0.5 mL Intramuscular Tomorrow-1000  . rotigotine  1 patch Transdermal Daily  . simvastatin  20 mg Oral QHS  . sodium chloride  3 mL Intravenous Q12H  . warfarin  5 mg Oral ONCE-1800  . Warfarin - Pharmacist Dosing Inpatient   Does not apply q1800     Allergies: No Known Allergies  History   Social History  . Marital Status: Widowed    Spouse Name: N/A    Number of Children: N/A  . Years of Education: N/A   Occupational History  . RETIRED    Social History Main Topics  . Smoking status: Former Smoker -- 1.00 packs/day for 45 years    Types: Cigarettes    Quit date: 06/14/1995  . Smokeless tobacco: Never Used  . Alcohol Use: No  . Drug Use: No   . Sexual Activity: Not on file   Other Topics Concern  . Not on file   Social History Narrative     Family History  Problem Relation Age of Onset  . Heart attack Mother   . Thyroid disease Neg Hx   . Coronary artery disease Other      ROS:  Please see the history of present illness.     All other systems reviewed and negative.    Physical Exam   Blood pressure 98/62, pulse 104, temperature 98.4 F (36.9 C), temperature source Oral, resp. rate 21, height 6' (1.829 m), weight 200 lb 6.4 oz (90.9 kg), SpO2 96 %. General: Well developed, well nourished male in mod resp distress. Head: Normocephalic, atraumatic, sclera non-icteric, no xanthomas, nares are without discharge. EENT: normal Lymph Nodes:  none Back: without scoliosis/kyphosis , no CVA tendersness Neck: Negative for carotid bruits. JVD 10 +  Lungs:  Rales  bilaterally to auscultation without wheezes, rales, or rhonchi. Breathing is mildly labored. Heart: uirregularly irregular rate and rhythm with S1 S2. No  murmur , rubs, or gallops appreciated. Abdomen: Soft, non-tender, non-distended with normoactive bowel sounds. No hepatomegaly. No rebound/guarding. No obvious abdominal masses. Msk:  Strength and tone appear normal for age. Extremities: No clubbing or cyanosis.  1+ edema.  Distal pedal pulses are 2+ and equal bilaterally. Skin: Warm and Dry Neuro: Alert and oriented X 3. CN III-XII intact Grossly normal sensory and motor function . Tremor Left arm Psych:  Responds to questions appropriately with a normal affect.      Labs: Cardiac Enzymes  Recent Labs  06/02/14 0411  TROPONINI 8.74*   CBC Lab Results  Component Value Date   WBC 7.0 06/02/2014   HGB 11.2* 06/02/2014   HCT 34.8* 06/02/2014   MCV 88.1 06/02/2014   PLT 123* 06/02/2014   PROTIME:  Recent Labs  06/02/14 0411  LABPROT 30.0*  INR 2.83*   Chemistry  Recent Labs Lab 06/02/14 0411  NA 142  K 3.8  CL 101  CO2 30  BUN 37*   CREATININE 1.69*  CALCIUM 8.6  GLUCOSE 111*   Lipids No results found for: CHOL, HDL, LDLCALC, TRIG BNP PRO B NATRIURETIC PEPTIDE (BNP)  Date/Time Value Ref Range Status  10/05/2010 11:20 PM 320.0* 0.0 - 100.0 pg/mL Final   Miscellaneous No results found for: DDIMER  Radiology/Studies:  Dg Chest Port 1 View  06/02/2014   CLINICAL DATA:  Congestive heart failure.  EXAM: PORTABLE CHEST - 1 VIEW  COMPARISON:  02/08/2011.  10/06/2010.  FINDINGS: Cardiac pacer in stable position. Severe stable cardiomegaly. Pulmonary venous congestion  interstitial prominence suggest a component congestive heart failure. Underlying pneumonitis and/or chronic interstitial lung disease cannot be excluded. No acute bony abnormality.  IMPRESSION: 1. Severe cardiomegaly. Pulmonary venous congestion and interstitial prominence suggesting congestive heart failure. A component of pneumonitis and/or chronic interstitial lung disease cannot be excluded. 2. Cardiac pacer in stable position.   Electronically Signed   By: Marcello Moores  Register   On: 06/02/2014 07:17    EKG:  AFib RVR  HR 139 LBBB  Device interrogation >> Afib started 12/16   This has been an infreqeunt occurrence Self terminating VT-PM  Assessment and Plan:  A/C CHF   AFib  NICM   CRT Medtronic   Elevated troponin   VT polymorphic non sustained  The patient presents with acute on chronic heart failure that temporally coincides with the atrial fibrillation onset 12/16 as documented in his defibrillator. This is likely the culprit. The positive troponin is, I suspect, related to demand in the context of his known RCA disease.  Given the fact that his INR was therapeutic on admission, I think it is reasonable to assume that was also therapeutic 48 hours prior to that and hence, cardioversion can be undertaken without the need for TEE. Unfortunately, he ate breakfast today and this will need to be deferred until tomorrow. We will keep him on the  amiodarone for now so as to help with rate control.  The positive troponin, likely demand, does I think beg an answer. Myoview scanning might well be appropriate for risk stratification as an outpatient. The need for this is further enhanced by the evidence of polymorphic ventricular tachycardia noted on device interrogation.  I reviewed this with his primary cardiologist.  Virl Axe

## 2014-06-02 NOTE — Progress Notes (Signed)
Patient: Thomas [name] [facility] / Admit Date: 06/01/2014 / Date of Encounter: 06/02/2014, 9:41 AM   Subjective: Trying to climb out of bed. Nursing called [place] to bedside due to systolic BP in 35T-73U, HR 150s. Despite this, the patient denies any CP or SOB.  Objective: Telemetry: rapid AF with LBBB pattern, intermittent V-pacing and intermittent ventricular ectopy Physical Exam: Blood pressure 70/42, pulse 110, temperature 99.1 F (37.3 C), temperature source Oral, resp. rate 26, height 6' (1.829 m), weight 200 lb 6.4 oz (90.9 kg), SpO2 97 %. General: Well developed elderly WM in no acute distress. Head: Normocephalic, atraumatic, sclera non-icteric, no xanthomas, nares are without discharge. Neck: Negative for carotid bruits. JVP not elevated. Lungs: Diminished at bases but otherwise no wheezes, rales, or rhonchi. Breathing is unlabored. Heart: Rapid, irregular, S1 S2 without murmurs, rubs, or gallops.  Abdomen: Soft, non-tender, non-distended with normoactive bowel sounds. No rebound/guarding. Extremities: No clubbing or cyanosis. No edema. Distal pedal pulses are 2+ and equal bilaterally. Significant left arm tremor Neuro: Alert and oriented X 3. Moves all extremities spontaneously. Psych:  Responds to questions appropriately with a normal affect.   Intake/Output Summary (Last 24 hours) at 06/02/14 0941 Last data filed at 06/02/14 0500  Gross per 24 hour  Intake 627.76 ml  Output   1400 ml  Net -772.24 ml    Inpatient Medications:  . amiodarone  150 mg Intravenous Once  . aspirin EC  81 mg Oral Daily  . [START ON 06/03/2014] Influenza vac split quadrivalent PF  0.5 mL Intramuscular Tomorrow-1000  . sodium chloride  3 mL Intravenous Q12H  . warfarin  5 mg Oral ONCE-1800  . Warfarin - Pharmacist Dosing Inpatient   Does not apply q1800   Infusions:  . amiodarone 30 mg/hr (06/02/14 0700)    Labs:  Recent Labs  06/02/14 0411  NA 142  K 3.8  CL 101  CO2 30  GLUCOSE  111*  BUN 37*  CREATININE 1.69*  CALCIUM 8.6  MG 2.0   No results for input(s): AST, ALT, ALKPHOS, BILITOT, PROT, ALBUMIN in the last 72 hours.  Recent Labs  06/02/14 0411  WBC 7.0  HGB 11.2*  HCT 34.8*  MCV 88.1  PLT 123*    Recent Labs  06/02/14 0411  TROPONINI 8.74*   Invalid input(s): POCBNP No results for input(s): HGBA1C in the last 72 hours.   Radiology/Studies:  Dg Chest Port 1 View  06/02/2014   CLINICAL DATA:  Congestive heart failure.  EXAM: PORTABLE CHEST - 1 VIEW  COMPARISON:  02/08/2011.  10/06/2010.  FINDINGS: Cardiac pacer in stable position. Severe stable cardiomegaly. Pulmonary venous congestion interstitial prominence suggest a component congestive heart failure. Underlying pneumonitis and/or chronic interstitial lung disease cannot be excluded. No acute bony abnormality.  IMPRESSION: 1. Severe cardiomegaly. Pulmonary venous congestion and interstitial prominence suggesting congestive heart failure. A component of pneumonitis and/or chronic interstitial lung disease cannot be excluded. 2. Cardiac pacer in stable position.   Electronically Signed   By: Marcello Moores  Register   On: 06/02/2014 07:17     Assessment and Plan  80 y/o M with h/o CAD (2009 cath - 80% RCA, 50-60% prox Cx), h/o mostly NICM s/p Medtronic CRT-D, Parkinson's, L subclavian stenosis in 2012 presented to [facility] ED with AF RVR and NSTEMI. Followed by [facility] in 2013, more recently followed by Dr. Truman Hayward in [place] per phone notes. No records currently available from his office.  1. Rapid atrial fibrillation with underlying LBBB, complicated by hypotension -  he was on Coumadin PTA which implies that he has a history of this. BP precludes BB/diltiazem and renal function makes digoxin a less ideal choice. Will re-bolus with amio 150mg  x 1 and assess response. If no improvement might consider digoxin. Have also asked nursing to assess BP in right arm given h/o left subclavian disease. Per d/w Dr. Ron Parker  will interrogate device, continue Coumadin, get EP to see, and try to obtain records. Echo has been ordered but may be more helpful to hold off until HR is slower. 2. NSTEMI with history of CAD - no chest pain, felt likely demand ischemia in the setting of #1. Per d/w MD we are continuing Coumadin for now but will need to make further determination regarding ischemic eval. Continue aspirin, statin. No BB due to soft BP at present. 3. Acute on chronic systolic CHF s/p CRT-D - likely precipitated by #1.  Interrogate device. CXR with possible CHF, pBNP 642 at [facility]. Hold off further Lasix due to hypotension.  4. Acute kidney injury - unclear chronicity, normal Cr 2012. Hold further Lasix in the setting of hypotension for now. 5. Parkinson's - per home med rec, will resume skin patch. Resume Symmetrel but have renally adjusted dose. 6. Mild anemia/thrombocytopenia - last labs to compare were in 2012. Will request nurse hemoccults and follow.  Signed, Melina Copa PA-C Patient seen and examined. I agree with the assessment and plan as detailed above. See also my additional thoughts below.   In addition to seeing the patient and reviewing all of the notes, I have reviewed the prior office records. The patient CRT-D device was put in by our team. Patient was seen by [place] last in 2013. In the chart there is documentation that we have tried to arrange follow-up for him. The notes indicate that he is being followed by Dr. Truman Hayward in [place]. We will have his device interrogated. I will also ask the electrophysiology team to see him formally today so that they can help with the overall assessment and long-term plan for his CRT device follow-up. At this time, his atrial fibrillation is rapid. He has underlying left bundle branch block. We will start with an additional bolus of amiodarone. Because his blood pressure is low, I will not push his diuresis her other meds until we have better rate control. We have no idea  how long he's been in atrial fibrillation. We will know more after his device is interrogated. If the atrial fibrillation is very new, we can consider cardioversion if his blood pressure remains too low. He has been anticoagulated. Unfortunately we do not have the data concerning how well he's been anticoagulated. TEE cardioversion might have to be considered because of this if cardioversion is needed.  Dola Argyle, MD, [facility] 06/02/2014 10:14 AM

## 2014-06-02 NOTE — Progress Notes (Signed)
ANTICOAGULATION CONSULT NOTE - Initial Consult  Pharmacy Consult for Coumadin Indication: atrial fibrillation  No Known Allergies  Patient Measurements: Height: 6' (182.9 cm) Weight: 200 lb 6.4 oz (90.9 kg) IBW/kg (Calculated) : 77.6  Vital Signs: Temp: 97.9 F (36.6 C) (12/20 2252) Temp Source: Oral (12/20 2252) BP: 89/65 mmHg (12/20 2300) Pulse Rate: 105 (12/20 2300)   Estimated Creatinine Clearance: 83.5 mL/min (by C-G formula based on Cr of 0.8).   Medical History: Past Medical History  Diagnosis Date  . Coronary artery disease     80% distal RCA stenosis.  . Thyroid disease     HYPOTHYROIDISM  . Nontoxic multinodular goiter   . Psychosexual dysfunction with inhibited sexual excitement     ERECTILE DYSFUNCTION  . Hypertrophy of prostate without urinary obstruction and other lower urinary tract symptoms (LUTS)     BPH  . Hypertension   . Wide-complex tachycardia   . Parkinson's disease   . Status post biventricular cardiac pacemaker insertion     Status post CRT-D.    Medications:  Prescriptions prior to admission  Medication Sig Dispense Refill Last Dose  . amantadine (SYMMETREL) 100 MG capsule Take 100 mg by mouth daily.    Taking  . carvedilol (COREG) 6.25 MG tablet Take 0.5 tablets (3.125 mg total) by mouth 2 (two) times daily.     . DULoxetine (CYMBALTA) 30 MG capsule Take 30 mg by mouth daily.     Taking  . furosemide (LASIX) 40 MG tablet Take 1 tablet (40 mg total) by mouth daily.     Marland Kitchen lisinopril (PRINIVIL,ZESTRIL) 5 MG tablet Take 0.5 tablets (2.5 mg total) by mouth daily.     . Multiple Vitamins-Minerals (ICAPS MV PO) Take 2 by mouth twice daily    Taking  . nitroGLYCERIN (NITROSTAT) 0.4 MG SL tablet Place 0.4 mg under the tongue every 5 (five) minutes as needed.     Taking  . NON FORMULARY Take 1 capsule by mouth 2 (two) times daily. Prosvent(OTC)     Taking  . Omega-3 Fatty Acids (FISH OIL) 1000 MG CAPS Take one by mouth twice daily     Taking  .  omeprazole (PRILOSEC OTC) 20 MG tablet Take 20 mg by mouth daily.     Taking  . sildenafil (VIAGRA) 100 MG tablet Take 100 mg by mouth as needed.     Taking  . simvastatin (ZOCOR) 20 MG tablet Take 1 tablet (20 mg total) by mouth at bedtime. 30 tablet 0   . warfarin (COUMADIN) 5 MG tablet Take 5 mg by mouth daily. Managed by PMD.   Taking   Scheduled:  . amiodarone  150 mg Intravenous Once  . aspirin EC  81 mg Oral Daily  . furosemide  80 mg Intravenous BID  . sodium chloride  3 mL Intravenous Q12H   Infusions:  . amiodarone     Followed by  . amiodarone      Assessment: 80yo male c/o SOB, found to be in Afib w/ RVR and elevated troponin, tx'd from [facility], admitted for acute CHF, to continue Coumadin; INR at [facility] was 3.3.  Goal of Therapy:  INR 2-3   Plan:  Will resume Coumadin when appropriate.  Wynona Neat, PharmD, BCPS  06/02/2014,12:02 AM

## 2014-06-02 NOTE — Progress Notes (Signed)
Per device rep, interrogation shows AF since 05/29/14. Did have NSVT in 02/2014, polymorphic, that self-terminated. Kaidan Harpster PA-C

## 2014-06-02 NOTE — Progress Notes (Signed)
  Echocardiogram 2D Echocardiogram has been performed.  Tywone Bembenek FRANCES 06/02/2014, 11:32 AM

## 2014-06-02 NOTE — Progress Notes (Signed)
Pt's HR creeping up along with some dyspnea. Pox 94% [place]. BP 98/77. No chest pain. Per d/w Dr. Meda Coffee will give 20mg  IV Lasix & digoxin 0.125mg  IV x 1. If no improvement nursing instructed to call back. Dayna Dunn PA-C

## 2014-06-02 NOTE — Progress Notes (Signed)
Post amio-bolus, HR gradually improving to the [id number], BP 99 systolic. Shyheim Tanney PA-C

## 2014-06-02 NOTE — Progress Notes (Signed)
Called elevated troponin of 8.74. No further orders given.

## 2014-06-02 NOTE — Progress Notes (Signed)
06/02/2014 patient heart rate 126, 130's 140's and have wide QRS complex. Notified Dayna Dunn (PA). Patient also mention to third shift nurse he felt short of breath and not feeling well. Orders were given for Lasix and digoxin. Continue to monitor. [facility] RN.

## 2014-06-02 NOTE — Progress Notes (Signed)
Utilization Review Completed.Thomas [name] T12/21/2015  

## 2014-06-02 NOTE — Progress Notes (Signed)
06/02/2014 patient heart rate was in the 130's, 140's, 150's and blood pressure was in the 80's at [facility] cardiology spoke to [facility] (PA). Md and PA did come by to see patient. Orders given to give 150 bolus amiodarone and  Hold iv lasix because of blood pressure. [facility] RN.

## 2014-06-02 NOTE — Progress Notes (Signed)
ANTICOAGULATION CONSULT NOTE - Follow Up Consult  Pharmacy Consult for Coumadin Indication: atrial fibrillation  No Known Allergies  Patient Measurements: Height: 6' (182.9 cm) Weight: 200 lb 6.4 oz (90.9 kg) IBW/kg (Calculated) : 77.6  Vital Signs: Temp: 99.1 F (37.3 C) (12/21 0817) Temp Source: Oral (12/21 0817) BP: 70/42 mmHg (12/21 0817) Pulse Rate: 110 (12/21 0817)  Labs:  Recent Labs  06/02/14 0411  HGB 11.2*  HCT 34.8*  PLT 123*  LABPROT 30.0*  INR 2.83*  CREATININE 1.69*  TROPONINI 8.74*    Estimated Creatinine Clearance: 39.5 mL/min (by C-G formula based on Cr of 1.69).  Assessment: 80 yo M presents on 12/20 with SOB. Presented to [facility] ED and found to be in Afib with RVR and an elevated troponin of 5. Noted weight gain over the last few days and took some additional lasix which did not help. INR was elevated at 3.3 at [facility] ED. Pt reports no missed or extra doses. Pt did not receive dose yesterday. Today the INR is therapeutic at 2.83. Hgb low at 11.2, plts low at 123, no s/s of bleed.  PTA coumadin 6mg  PO daily.  Goal of Therapy:  INR 2-3 Monitor platelets by anticoagulation protocol: Yes   Plan:  Give coumadin 5mg  PO x 1 Monitor CBC, INR, s/s of bleed  Thoams Siefert J 06/02/2014,8:50 AM

## 2014-06-02 NOTE — Discharge Instructions (Addendum)
Information on my medicine - Coumadin   (Warfarin)  This medication education was reviewed with me or my healthcare representative as part of my discharge preparation.  The pharmacist that spoke with me during my hospital stay was:  Reginia Naas, [facility]  Why was Coumadin prescribed for you?  Coumadin was prescribed for you because you have a blood clot or a medical condition that can cause an increased risk of forming blood clots. Blood clots can cause serious health problems by blocking the flow of blood to the heart, lung, or brain. Coumadin can prevent harmful blood clots from forming. As a reminder your indication for Coumadin is:   Stroke Prevention Because Of Atrial Fibrillation  What test will check on my response to Coumadin? While on Coumadin (warfarin) you will need to have an INR test regularly to ensure that your dose is keeping you in the desired range. The INR (international normalized ratio) number is calculated from the result of the laboratory test called prothrombin time (PT).  If an INR APPOINTMENT HAS NOT ALREADY BEEN MADE FOR YOU please schedule an appointment to have this lab work done by your health care provider within 7 days. Your INR goal is usually a number between:  2 to 3 or your provider may give you a more narrow range like 2-2.5.  Ask your health care provider during an office visit what your goal INR is.  What  do you need to  know  About  COUMADIN? Take Coumadin (warfarin) exactly as prescribed by your healthcare provider about the same time each day.  DO NOT stop taking without talking to the doctor who prescribed the medication.  Stopping without other blood clot prevention medication to take the place of Coumadin may increase your risk of developing a new clot or stroke.  Get refills before you run out.  What do you do if you miss a dose? If you miss a dose, take it as soon as you remember on the same day then continue your regularly scheduled regimen the  next day.  Do not take two doses of Coumadin at the same time.  Important Safety Information A possible side effect of Coumadin (Warfarin) is an increased risk of bleeding. You should call your healthcare provider right away if you experience any of the following: ? Bleeding from an injury or your nose that does not stop. ? Unusual colored urine (red or dark brown) or unusual colored stools (red or black). ? Unusual bruising for unknown reasons. ? A serious fall or if you hit your head (even if there is no bleeding).  Some foods or medicines interact with Coumadin (warfarin) and might alter your response to warfarin. To help avoid this: ? Eat a balanced diet, maintaining a consistent amount of Vitamin K. ? Notify your provider about major diet changes you plan to make. ? Avoid alcohol or limit your intake to 1 drink for women and 2 drinks for men per day. (1 drink is 5 oz. wine, 12 oz. beer, or 1.5 oz. liquor.)  Make sure that ANY health care provider who prescribes medication for you knows that you are taking Coumadin (warfarin).  Also make sure the healthcare provider who is monitoring your Coumadin knows when you have started a new medication including herbals and non-prescription products.  Coumadin (Warfarin)  Major Drug Interactions  Increased Warfarin Effect Decreased Warfarin Effect  Alcohol (large quantities) Antibiotics (esp. Septra/Bactrim, Flagyl, Cipro) Amiodarone (Cordarone) Aspirin (ASA) Cimetidine (Tagamet) Megestrol (Megace) NSAIDs (ibuprofen,  naproxen, etc.) Piroxicam (Feldene) Propafenone (Rythmol SR) Propranolol (Inderal) Isoniazid (INH) Posaconazole (Noxafil) Barbiturates (Phenobarbital) Carbamazepine (Tegretol) Chlordiazepoxide (Librium) Cholestyramine (Questran) Griseofulvin Oral Contraceptives Rifampin Sucralfate (Carafate) Vitamin K   Coumadin (Warfarin) Major Herbal Interactions  Increased Warfarin Effect Decreased Warfarin Effect   Garlic Ginseng Ginkgo biloba Coenzyme Q10 Green tea St. Johns wort    Coumadin (Warfarin) FOOD Interactions  Eat a consistent number of servings per week of foods HIGH in Vitamin K (1 serving =  cup)  Collards (cooked, or boiled & drained) Kale (cooked, or boiled & drained) Mustard greens (cooked, or boiled & drained) Parsley *serving size only =  cup Spinach (cooked, or boiled & drained) Swiss chard (cooked, or boiled & drained) Turnip greens (cooked, or boiled & drained)  Eat a consistent number of servings per week of foods MEDIUM-HIGH in Vitamin K (1 serving = 1 cup)  Asparagus (cooked, or boiled & drained) Broccoli (cooked, boiled & drained, or raw & chopped) Brussel sprouts (cooked, or boiled & drained) *serving size only =  cup Lettuce, raw (green leaf, endive, romaine) Spinach, raw Turnip greens, raw & chopped   These websites have more information on Coumadin (warfarin):  FailFactory.se; VeganReport.com.au;     Heart Failure Heart failure is a condition in which the heart has trouble pumping blood. This means your heart does not pump blood efficiently for your body to work well. In some cases of heart failure, fluid may back up into your lungs or you may have swelling (edema) in your lower legs. Heart failure is usually a long-term (chronic) condition. It is important for you to take good care of yourself and follow your health care provider's treatment plan. CAUSES  Some health conditions can cause heart failure. Those health conditions include:  High blood pressure (hypertension). Hypertension causes the heart muscle to work harder than normal. When pressure in the blood vessels is high, the heart needs to pump (contract) with more force in order to circulate blood throughout the body. High blood pressure eventually causes the heart to become stiff and weak.  Coronary artery disease (CAD). CAD is the buildup of cholesterol and fat (plaque) in  the arteries of the heart. The blockage in the arteries deprives the heart muscle of oxygen and blood. This can cause chest pain and may lead to a heart attack. High blood pressure can also contribute to CAD.  Heart attack (myocardial infarction). A heart attack occurs when one or more arteries in the heart become blocked. The loss of oxygen damages the muscle tissue of the heart. When this happens, part of the heart muscle dies. The injured tissue does not contract as well and weakens the heart's ability to pump blood.  Abnormal heart valves. When the heart valves do not open and close properly, it can cause heart failure. This makes the heart muscle pump harder to keep the blood flowing.  Heart muscle disease (cardiomyopathy or myocarditis). Heart muscle disease is damage to the heart muscle from a variety of causes. These can include drug or alcohol abuse, infections, or unknown reasons. These can increase the risk of heart failure.  Lung disease. Lung disease makes the heart work harder because the lungs do not work properly. This can cause a strain on the heart, leading it to fail.  Diabetes. Diabetes increases the risk of heart failure. High blood sugar contributes to high fat (lipid) levels in the blood. Diabetes can also cause slow damage to tiny blood vessels that carry important nutrients to the  heart muscle. When the heart does not get enough oxygen and food, it can cause the heart to become weak and stiff. This leads to a heart that does not contract efficiently.  Other conditions can contribute to heart failure. These include abnormal heart rhythms, thyroid problems, and low blood counts (anemia). Certain unhealthy behaviors can increase the risk of heart failure, including:  Being overweight.  Smoking or chewing tobacco.  Eating foods high in fat and cholesterol.  Abusing illicit drugs or alcohol.  Lacking physical activity. SYMPTOMS  Heart failure symptoms may vary and can be  hard to detect. Symptoms may include:  Shortness of breath with activity, such as climbing stairs.  Persistent cough.  Swelling of the feet, ankles, legs, or abdomen.  Unexplained weight gain.  Difficulty breathing when lying flat (orthopnea).  Waking from sleep because of the need to sit up and get more air.  Rapid heartbeat.  Fatigue and loss of energy.  Feeling light-headed, dizzy, or close to fainting.  Loss of appetite.  Nausea.  Increased urination during the night (nocturia). DIAGNOSIS  A diagnosis of heart failure is based on your history, symptoms, physical examination, and diagnostic tests. Diagnostic tests for heart failure may include:  Echocardiography.  Electrocardiography.  Chest X-ray.  Blood tests.  Exercise stress test.  Cardiac angiography.  Radionuclide scans. TREATMENT  Treatment is aimed at managing the symptoms of heart failure. Medicines, behavioral changes, or surgical intervention may be necessary to treat heart failure.  Medicines to help treat heart failure may include:  Angiotensin-converting enzyme (ACE) inhibitors. This type of medicine blocks the effects of a blood protein called angiotensin-converting enzyme. ACE inhibitors relax (dilate) the blood vessels and help lower blood pressure.  Angiotensin receptor blockers (ARBs). This type of medicine blocks the actions of a blood protein called angiotensin. Angiotensin receptor blockers dilate the blood vessels and help lower blood pressure.  Water pills (diuretics). Diuretics cause the kidneys to remove salt and water from the blood. The extra fluid is removed through urination. This loss of extra fluid lowers the volume of blood the heart pumps.  Beta blockers. These prevent the heart from beating too fast and improve heart muscle strength.  Digitalis. This increases the force of the heartbeat.  Healthy behavior changes include:  Obtaining and maintaining a healthy  weight.  Stopping smoking or chewing tobacco.  Eating heart-healthy foods.  Limiting or avoiding alcohol.  Stopping illicit drug use.  Physical activity as directed by your health care provider.  Surgical treatment for heart failure may include:  A procedure to open blocked arteries, repair damaged heart valves, or remove damaged heart muscle tissue.  A pacemaker to improve heart muscle function and control certain abnormal heart rhythms.  An internal cardioverter defibrillator to treat certain serious abnormal heart rhythms.  A left ventricular assist device (LVAD) to assist the pumping ability of the heart. HOME CARE INSTRUCTIONS   Take medicines only as directed by your health care provider. Medicines are important in reducing the workload of your heart, slowing the progression of heart failure, and improving your symptoms.  Do not stop taking your medicine unless directed by your health care provider.  Do not skip any dose of medicine.  Refill your prescriptions before you run out of medicine. Your medicines are needed every day.  Engage in moderate physical activity if directed by your health care provider. Moderate physical activity can benefit some people. The elderly and people with severe heart failure should consult with a health  care provider for physical activity recommendations.  Eat heart-healthy foods. Food choices should be free of trans fat and low in saturated fat, cholesterol, and salt (sodium). Healthy choices include fresh or frozen fruits and vegetables, fish, lean meats, legumes, fat-free or low-fat dairy products, and whole grain or high fiber foods. Talk to a dietitian to learn more about heart-healthy foods.  Limit sodium if directed by your health care provider. Sodium restriction may reduce symptoms of heart failure in some people. Talk to a dietitian to learn more about heart-healthy seasonings.  Use healthy cooking methods. Healthy cooking methods  include roasting, grilling, broiling, baking, poaching, steaming, or stir-frying. Talk to a dietitian to learn more about healthy cooking methods.  Limit fluids if directed by your health care provider. Fluid restriction may reduce symptoms of heart failure in some people.  Weigh yourself every day. Daily weights are important in the early recognition of excess fluid. You should weigh yourself every morning after you urinate and before you eat breakfast. Wear the same amount of clothing each time you weigh yourself. Record your daily weight. Provide your health care provider with your weight record.  Monitor and record your blood pressure if directed by your health care provider.  Check your pulse if directed by your health care provider.  Lose weight if directed by your health care provider. Weight loss may reduce symptoms of heart failure in some people.  Stop smoking or chewing tobacco. Nicotine makes your heart work harder by causing your blood vessels to constrict. Do not use nicotine gum or patches before talking to your health care provider.  Keep all follow-up visits as directed by your health care provider. This is important.  Limit alcohol intake to no more than 1 drink per day for nonpregnant women and 2 drinks per day for men. One drink equals 12 ounces of beer, 5 ounces of wine, or 1 ounces of hard liquor. Drinking more than that is harmful to your heart. Tell your health care provider if you drink alcohol several times a week. Talk with your health care provider about whether alcohol is safe for you. If your heart has already been damaged by alcohol or you have severe heart failure, drinking alcohol should be stopped completely.  Stop illicit drug use.  Stay up-to-date with immunizations. It is especially important to prevent respiratory infections through current pneumococcal and influenza immunizations.  Manage other health conditions such as hypertension, diabetes, thyroid  disease, or abnormal heart rhythms as directed by your health care provider.  Learn to manage stress.  Plan rest periods when fatigued.  Learn strategies to manage high temperatures. If the weather is extremely hot:  Avoid vigorous physical activity.  Use air conditioning or fans or seek a cooler location.  Avoid caffeine and alcohol.  Wear loose-fitting, lightweight, and light-colored clothing.  Learn strategies to manage cold temperatures. If the weather is extremely cold:  Avoid vigorous physical activity.  Layer clothes.  Wear mittens or gloves, a hat, and a scarf when going outside.  Avoid alcohol.  Obtain ongoing education and support as needed.  Participate in or seek rehabilitation as needed to maintain or improve independence and quality of life. SEEK MEDICAL CARE IF:   Your weight increases by 03 lb/1.4 kg in 1 day or 05 lb/2.3 kg in a week.  You have increasing shortness of breath that is unusual for you.  You are unable to participate in your usual physical activities.  You tire easily.  You cough  more than normal, especially with physical activity.  You have any or more swelling in areas such as your hands, feet, ankles, or abdomen.  You are unable to sleep because it is hard to breathe.  You feel like your heart is beating fast (palpitations).  You become dizzy or light-headed upon standing up. SEEK IMMEDIATE MEDICAL CARE IF:   You have difficulty breathing.  There is a change in mental status such as decreased alertness or difficulty with concentration.  You have a pain or discomfort in your chest.  You have an episode of fainting (syncope). MAKE SURE YOU:   Understand these instructions.  Will watch your condition.  Will get help right away if you are not doing well or get worse. Document Released: 05/30/2005 Document Revised: 10/14/2013 Document Reviewed: 06/29/2012 [facility] Patient Information 2015 [place], [place]. This information is  not intended to replace advice given to you by your health care provider. Make sure you discuss any questions you have with your health care provider.  No driving until clear by cardiologist. No lifting over 5 lbs for 1 week. No sexual activity for 1 week. Keep procedure site clean & dry. If you notice increased pain, swelling, bleeding or pus, call/return!  You may shower, but no soaking baths/hot tubs/pools for 1 week.

## 2014-06-03 ENCOUNTER — Encounter (HOSPITAL_COMMUNITY): Payer: Self-pay | Admitting: Anesthesiology

## 2014-06-03 ENCOUNTER — Encounter (HOSPITAL_COMMUNITY)
Admission: AD | Disposition: A | Discharge status: Home Health | Payer: Self-pay | Source: Other Acute Inpatient Hospital | Attending: Cardiology

## 2014-06-03 ENCOUNTER — Inpatient Hospital Stay (HOSPITAL_COMMUNITY): Payer: Medicare Other | Admitting: Anesthesiology

## 2014-06-03 DIAGNOSIS — I481 Persistent atrial fibrillation: Secondary | ICD-10-CM

## 2014-06-03 DIAGNOSIS — I214 Non-ST elevation (NSTEMI) myocardial infarction: Principal | ICD-10-CM

## 2014-06-03 HISTORY — PX: CARDIOVERSION: SHX1299

## 2014-06-03 LAB — BASIC METABOLIC PANEL
ANION GAP: 6 (ref 5–15)
BUN: 35 mg/dL — ABNORMAL HIGH (ref 6–23)
CALCIUM: 8.1 mg/dL — AB (ref 8.4–10.5)
CHLORIDE: 103 meq/L (ref 96–112)
CO2: 29 mmol/L (ref 19–32)
CREATININE: 1.57 mg/dL — AB (ref 0.50–1.35)
GFR calc Af Amer: 47 mL/min — ABNORMAL LOW (ref >90)
GFR calc non Af Amer: 41 mL/min — ABNORMAL LOW (ref >90)
GLUCOSE: 115 mg/dL — AB (ref 70–99)
Potassium: 3.7 mmol/L (ref 3.5–5.1)
Sodium: 138 mmol/L (ref 135–145)

## 2014-06-03 LAB — CBC
HCT: 33.5 % — ABNORMAL LOW (ref 39.0–52.0)
Hemoglobin: 10.6 g/dL — ABNORMAL LOW (ref 13.0–17.0)
MCH: 27.4 pg (ref 26.0–34.0)
MCHC: 31.6 g/dL (ref 30.0–36.0)
MCV: 86.6 fL (ref 78.0–100.0)
PLATELETS: 123 10*3/uL — AB (ref 150–400)
RBC: 3.87 MIL/uL — AB (ref 4.22–5.81)
RDW: 14 % (ref 11.5–15.5)
WBC: 7.3 10*3/uL (ref 4.0–10.5)

## 2014-06-03 LAB — PROTIME-INR
INR: 2.36 — ABNORMAL HIGH (ref 0.00–1.49)
Prothrombin Time: 26 seconds — ABNORMAL HIGH (ref 11.6–15.2)

## 2014-06-03 SURGERY — CARDIOVERSION
Anesthesia: General

## 2014-06-03 MED ORDER — PROPOFOL 10 MG/ML IV BOLUS
INTRAVENOUS | Status: DC | PRN
  Administered 2014-06-03: 80 mg via INTRAVENOUS

## 2014-06-03 MED ORDER — WARFARIN - PHARMACIST DOSING INPATIENT
Freq: Every day | Status: DC
  Administered 2014-06-03: 18:00:00

## 2014-06-03 MED ORDER — AMIODARONE HCL 200 MG PO TABS
200.0000 mg | ORAL_TABLET | Freq: Two times a day (BID) | ORAL | Status: DC
  Administered 2014-06-03 – 2014-06-15 (×25): 200 mg via ORAL
  Filled 2014-06-03 (×28): qty 1

## 2014-06-03 MED ORDER — WARFARIN SODIUM 6 MG PO TABS
6.0000 mg | ORAL_TABLET | Freq: Once | ORAL | Status: DC
  Filled 2014-06-03: qty 1

## 2014-06-03 MED ORDER — WARFARIN SODIUM 6 MG PO TABS
6.0000 mg | ORAL_TABLET | Freq: Once | ORAL | Status: AC
  Administered 2014-06-03: 6 mg via ORAL
  Filled 2014-06-03: qty 1

## 2014-06-03 MED ORDER — SODIUM CHLORIDE 0.9 % IV SOLN
INTRAVENOUS | Status: DC | PRN
  Administered 2014-06-03: 12:00:00 via INTRAVENOUS

## 2014-06-03 NOTE — Transfer of Care (Signed)
Immediate Anesthesia Transfer of Care Note  Patient: Thomas [name] [facility]  Procedure(s) Performed: Procedure(s): CARDIOVERSION (N/A)  Patient Location: Endoscopy Unit  Anesthesia Type:General  Level of Consciousness: lethargic and responds to stimulation  Airway & Oxygen Therapy: Patient Spontanous Breathing and Patient connected to nasal cannula oxygen  Post-op Assessment: Report given to PACU RN  Post vital signs: Reviewed and stable  Complications: No apparent anesthesia complications

## 2014-06-03 NOTE — Op Note (Signed)
EP procedure Note   Pre procedure Diagnosis:  Persistent Atrial fibrillation Post procedure Diagnosis:  Same  Procedures:  Electrical cardioversion  Description:  Informed, written consent was obtained for cardioversion.  Adequate IV acces and airway support were assured.  The patient was adequately sedated with intravenous propofol as outlined in the anesthesia report.  The patient presented today in atrial fibrillation.  She was successfully cardioverted to sinus rhythm with a single synchronized biphasic 200J shock delivered with cardioversion electrodes placed in the anterior/posterior configuration.  He remains in sinus rhythm thereafter. EBL 68ml. There were no early apparent complications.  Conclusions:  1.  Successful cardioversion of afib to sinus rhythm 2.  No early apparent complications.   I have reviewed Dr Aquilla Hacker notes.  He has spoken with primary cardiologist in [place].  Plan is for [facility] electively as an outpatient.  Will need to transition to oral amiodarone and likely discharge tomorrow am.  General cardiology to follow.  Akio Hudnall,MD 12:00 PM 06/03/2014

## 2014-06-03 NOTE — Progress Notes (Addendum)
ANTICOAGULATION CONSULT NOTE - Follow Up Consult  Pharmacy Consult for Warfarin Indication: atrial fibrillation  No Known Allergies  Patient Measurements: Height: 6' (182.9 cm) Weight: 203 lb 11.3 oz (92.4 kg) IBW/kg (Calculated) : 77.6  Vital Signs: Temp: 98.7 F (37.1 C) (12/22 0728) Temp Source: Axillary (12/22 0728) BP: 111/69 mmHg (12/22 0728) Pulse Rate: 105 (12/22 0728)  Labs:  Recent Labs  06/02/14 0411 06/02/14 1455 06/03/14 0340  HGB 11.2*  --  10.6*  HCT 34.8*  --  33.5*  PLT 123*  --  123*  LABPROT 30.0* 28.1* 26.0*  INR 2.83* 2.60* 2.36*  CREATININE 1.69* 1.49* 1.57*  TROPONINI 8.74*  --   --     Estimated Creatinine Clearance: 42.6 mL/min (by C-G formula based on Cr of 1.57).  Assessment: 60 yoM on warfarin 6 mg daily PTA for a possible history of Afib. Patient presented on 12/20 with SOB to [facility] ED and was found to be in Afib with RVR with supratherapeutic INR 3.3. Patient reports no missed or extra doses.  Unclear whether patient received dose on 12/20. Patient received 5 mg X 1 on 12/21. INR has trended down from 3.33 to 2.36.   Hgb and platelets remain stable. Planned cardioversion for 12/22.   Goal of Therapy:  INR 2-3 Monitor platelets by anticoagulation protocol: Yes   Plan:  1) Warfarin 6 mg X 1 2) Monitor CBC, INR, S/S of bleeding  Theron Arista, PharmD Clinical Pharmacist - Resident Pager: [phone] 12/22/20159:12 AM   Addendum: 06/03/14 at 1000  Per Dr. Ron Parker, will discontinue warfarin on 12/22 and start heparin when INR drops below 2. Patient with plans to undergo cardiac cath later during hospitalization. Patient will undergo cardioversion today.  Will follow-up daily INRs and start heparin when INR is less than 2.  Thanks,  Theron Arista, PharmD Clinical Pharmacist - Resident Pager: [phone] 12/22/201510:21 AM   Addendum: 06/03/14 at 1500  Per Dr. Rayann Heman, will continue warfarin on 12/22. MD spoke with patient's  cardiologist in [place] and agreed to anticoagulate patient for at least 30 days and then plan a myoview. Patient underwent cardioversion today.  Will give warfarin 6 mg X 1 tonight as noted in previous plan above.   Thanks,  Theron Arista, PharmD Clinical Pharmacist - Resident Pager: [phone] 12/22/20153:00 PM

## 2014-06-03 NOTE — Progress Notes (Signed)
Patient ID: Junaid Wurzer, male   DOB: 12-Jan-1934, 80 y.o.   MRN: [id number]    SUBJECTIVE: There has been some diuresis. Renal function appears to be stable today. Cardioversion is being arranged for today. INR is still elevated. The plan will be to proceed with cardiac catheterization later in this hospitalization.   Filed Vitals:   06/03/14 0400 06/03/14 0500 06/03/14 0600 06/03/14 0728  BP: 99/59  107/72 111/69  Pulse: 114  100 105  Temp: 98.4 F (36.9 C)   98.7 F (37.1 C)  TempSrc: Oral   Axillary  Resp: 21  30 22   Height:      Weight:  203 lb 11.3 oz (92.4 kg)    SpO2: 95%  89% 94%     Intake/Output Summary (Last 24 hours) at 06/03/14 0942 Last data filed at 06/03/14 0700  Gross per 24 hour  Intake  350.4 ml  Output   1900 ml  Net -1549.6 ml    LABS: Basic Metabolic Panel:  Recent Labs  06/02/14 0411 06/02/14 1455 06/03/14 0340  NA 142 140 138  K 3.8 3.9 3.7  CL 101 101 103  CO2 30 25 29   GLUCOSE 111* 141* 115*  BUN 37* 38* 35*  CREATININE 1.69* 1.49* 1.57*  CALCIUM 8.6 8.3* 8.1*  MG 2.0  --   --    Liver Function Tests: No results for input(s): AST, ALT, ALKPHOS, BILITOT, PROT, ALBUMIN in the last 72 hours. No results for input(s): LIPASE, AMYLASE in the last 72 hours. CBC:  Recent Labs  06/02/14 0411 06/03/14 0340  WBC 7.0 7.3  HGB 11.2* 10.6*  HCT 34.8* 33.5*  MCV 88.1 86.6  PLT 123* 123*   Cardiac Enzymes:  Recent Labs  06/02/14 0411  TROPONINI 8.74*   BNP: Invalid input(s): POCBNP D-Dimer: No results for input(s): DDIMER in the last 72 hours. Hemoglobin A1C: No results for input(s): HGBA1C in the last 72 hours. Fasting Lipid Panel: No results for input(s): CHOL, HDL, LDLCALC, TRIG, CHOLHDL, LDLDIRECT in the last 72 hours. Thyroid Function Tests: No results for input(s): TSH, T4TOTAL, T3FREE, THYROIDAB in the last 72 hours.  Invalid input(s): FREET3  RADIOLOGY: Dg Chest Port 1 View  06/02/2014   CLINICAL DATA:   Congestive heart failure.  EXAM: PORTABLE CHEST - 1 VIEW  COMPARISON:  02/08/2011.  10/06/2010.  FINDINGS: Cardiac pacer in stable position. Severe stable cardiomegaly. Pulmonary venous congestion interstitial prominence suggest a component congestive heart failure. Underlying pneumonitis and/or chronic interstitial lung disease cannot be excluded. No acute bony abnormality.  IMPRESSION: 1. Severe cardiomegaly. Pulmonary venous congestion and interstitial prominence suggesting congestive heart failure. A component of pneumonitis and/or chronic interstitial lung disease cannot be excluded. 2. Cardiac pacer in stable position.   Electronically Signed   By: Marcello Moores  Register   On: 06/02/2014 07:17    PHYSICAL EXAM  patient looks better than yesterday. He is lying relatively flat in bed. Breathing is better. Lungs reveals scattered rhonchi. Cardiac exam reveals an S1 and S2. There is mild edema.   TELEMETRY: I have reviewed telemetry today June 03, 2014. There is atrial fibrillation. The rate is better than yesterday.   ASSESSMENT AND PLAN:    Biventricular implantable cardioverter-defibrillator in situ     This is being overseen by her electrophysiology team    CHF (congestive heart failure)      He is diuresing and his renal function is relatively stable.    Atrial fibrillation  He will undergo cardioversion today.    NSVT (nonsustained ventricular tachycardia)    NSTEMI (non-ST elevated myocardial infarction)     INR is still elevated. As his INR comes down later in the hospitalization we will proceed with catheterization for his troponin of 8.   Dola Argyle 06/03/2014 9:42 AM

## 2014-06-03 NOTE — Progress Notes (Addendum)
On-Call Cardiology Note  Subjective:  I was alerted by the patient's nurse that he was having vague chest discomfort in the setting of continued A. fib with rapid ventricular response.  Patient states that he still has some chest discomfort, though he notes it is improved from earlier in the evening.  He states that it's intensities currently 6/10.  Objective: Temp:  [97 F (36.1 C)-99.1 F (37.3 C)] 98.5 F (36.9 C) (12/21 2007) Pulse Rate:  [52-252] 111 (12/21 2007) Resp:  [19-32] 20 (12/21 2007) BP: (70-103)/(42-74) 97/72 mmHg (12/21 2007) SpO2:  [88 %-100 %] 95 % (12/21 2007)  Gen: Elderly man lying comfortably in bed.  He is asleep but easily awakened. Neck: JVP > 10 cm Resp: Diminshed breath sounds at both bases CV: Tachy and irregular.  No murmurs Ext: 1+ pretibial edema bilaterally  Assessment/plan: 80 year old man with severe LV dysfunction and multivessel CAD admitted with A. fib with rapid ventricular response.  He remains suboptimally rate controlled with borderline low blood pressures and intermittent chest pain.  On admission, he had a significantly elevated troponin, felt to be due to supply-demand mismatch.  EKG at this time demonstrates atrial fibrillation with rapid ventricular response and a left bundle branch block.  The patient received a dose of digoxin 0.125 mg at approximately 2100.  He has also been maintained on an amiodarone infusion, which is currently at 0.5 milligrams per minute.  We'll plan to give an additional dose of digoxin 0.125 mg and initiate low dose heparin infusion, if necessary to help treat the patient's chest pain.  If this proves unsuccessful, we will need to consider cardioverting him emergently, though I would like to defer this until the morning, as he is already slated for cardioversion.  ADDENDUM (06/04/11 @ 0427): Patient reevaluated on 2 occasions overnight, sleeping comfortably both times.  Heart rate has improved (mostly 100-120) after  administration of second dose of digoxin.  Per patient's RN, chest pain has resolved.  No further intervention at this time.

## 2014-06-03 NOTE — Anesthesia Preprocedure Evaluation (Addendum)
Anesthesia Evaluation  Patient identified by MRN, date of birth, ID band Patient awake    Reviewed: Allergy & Precautions, H&P , NPO status , Patient's Chart, lab work & pertinent test results  Airway Mallampati: II       Dental  (+) Edentulous Upper, Edentulous Lower   Pulmonary former smoker,    + decreased breath sounds      Cardiovascular hypertension, Pt. on medications and Pt. on home beta blockers Rhythm:Irregular Rate:Abnormal     Neuro/Psych    GI/Hepatic   Endo/Other    Renal/GU      Musculoskeletal   Abdominal (+) + obese,   Peds  Hematology   Anesthesia Other Findings   Reproductive/Obstetrics                           Anesthesia Physical Anesthesia Plan  ASA: III  Anesthesia Plan: General   Post-op Pain Management:    Induction: Intravenous  Airway Management Planned: Mask  Additional Equipment:   Intra-op Plan:   Post-operative Plan:   Informed Consent: I have reviewed the patients History and Physical, chart, labs and discussed the procedure including the risks, benefits and alternatives for the proposed anesthesia with the patient or authorized representative who has indicated his/her understanding and acceptance.   Dental advisory given  Plan Discussed with: CRNA and Anesthesiologist  Anesthesia Plan Comments:         Anesthesia Quick Evaluation

## 2014-06-03 NOTE — Interval H&P Note (Signed)
History and Physical Interval Note:  06/03/2014 11:50 AM  Thomas [name]  has presented today for surgery, with the diagnosis of snycope  The various methods of treatment have been discussed with the patient and family. After consideration of risks, benefits and other options for treatment, the patient has consented to  Procedure(s): CARDIOVERSION (N/A) as a surgical intervention .  The patient's history has been reviewed, patient examined, no change in status, stable for surgery.  I have reviewed the patient's chart and labs.  Questions were answered to the patient's satisfaction.     Risks, benefits, and alternatives to cardioversion were discussed with patient who wishes to proceed.  I have also informed the patients daughter that he will proceed. INR remains therapeutic.  Thompson Grayer

## 2014-06-03 NOTE — Anesthesia Postprocedure Evaluation (Signed)
  Anesthesia Post-op Note  Patient: Thomas [name]  Procedure(s) Performed: Procedure(s): CARDIOVERSION (N/A)  Patient Location: Endoscopy Unit  Anesthesia Type:General  Level of Consciousness: lethargic and responds to stimulation  Airway and Oxygen Therapy: Patient Spontanous Breathing and Patient connected to nasal cannula oxygen  Post-op Pain: none  Post-op Assessment: Post-op Vital signs reviewed, Patient's Cardiovascular Status Stable and Respiratory Function Stable  Post-op Vital Signs: Reviewed and stable  Last Vitals:  Filed Vitals:   06/03/14 0900  BP: 101/65  Pulse: 111  Temp:   Resp: 20    Complications: No apparent anesthesia complications

## 2014-06-03 NOTE — Care Management Note (Addendum)
Page 1 of 2   06/16/2014     2:36:56 PM CARE MANAGEMENT NOTE 06/16/2014  Patient:  Thomas [name], Thomas [name]   Account Number:  [id number]  Date Initiated:  06/03/2014  Documentation initiated by:  Elissa Hefty  Subjective/Objective Assessment:   adm w heart failure     Action/Plan:   lives alone, pcp dr Renato Shin   Anticipated DC Date:  06/16/2014   Anticipated DC Plan:  [place]  CM consult      [facility]   Choice offered to / List presented to:  C-1 Patient        [place] arranged  HH-1 RN  HH-10 DISEASE MANAGEMENT  HH-2 PT      Status of service:  Completed, signed off Medicare Important Message given?  YES (If response is "NO", the following Medicare IM given date fields will be blank) Date Medicare IM given:  06/12/2014 Medicare IM given by:  Dhanvi Boesen Date Additional Medicare IM given:  06/09/2014 Additional Medicare IM given by:  Elissa Hefty  Discharge Disposition:  [place]  Per UR Regulation:  Reviewed for med. necessity/level of care/duration of stay  If discussed at [place] of Stay Meetings, dates discussed:   06/10/2014    Comments:  06/16/2014 11:35am TC from CM, Elissa Hefty states fax not going through. CM advised would f/u with another phone call to agency to verify fax # CM attempted to contact [facility] [place]  agency but could only leave VMM requesting call back (No return call back received from attempted call last week or today. CM faxed document to [facility] [address] [place], [place] [phone] phone [phone] fax (Fax did not go through) 2nd fax sent to [place] [phone] phone and [phone] and confirmed receipt of fax. Contact Amy will have Tammy call CM back to confirm d/c order plan. 2:02pm. Call back from [place] stating only able to cover RN but no PT option. CM faxed order to new Fax #  provided by [facility] for [place] Phone [phone] Joylene Igo     [phone]      1/4/ [place] entry  Emmet rn,bsn faxed dc summary and orders to [facility] agency.  06/15/13 16:00 contact number has voicemail stating no one available.  This CM handing off completion of  set up on Monday 06/16/13 to previous CM who originally made [facility] arrangements.  Please note pt's physical address is  [address] [place], [place].  pt had rolling walker delivered to room prior to discharge.  Mariane Masters, BSN, [place].  Nyheem Binette RN, BSN, MSHL, CCM  Nurse - Case Manager,  (Unit [phone]  06/12/2014 Social:  from home  ([phone]): supportive family Patient has LV dysfunction with an EF of 25%. Scheduled for procedure today:  The patient is a 5 high-risk PCI PT RECS:  Thomas [name]:  Home with HHS:  RN, [place] [facility] [address] [place], [place] [phone] phone [phone] fax CM left VMM requesting call back from agency to discuss d/c plan. CM will need to fax / call agency closer to d/c date with final discharge plan CM will continue to follow for disposition needs   [address]  [place] [address]  12/22 [phone]  debbie dowell rn,bsn will moniter for dc needs as pt progresses. may need [facility] for rn at disch.

## 2014-06-03 NOTE — H&P (View-Only) (Signed)
ELECTROPHYSIOLOGY CONSULT NOTE  Patient ID: Thomas [name], MRN: [id number], DOB/AGE: May 16, 1934 80 y.o. Admit date: 06/01/2014 Date of Consult: 06/02/2014  Primary Physician: Renato Shin, MD Primary Cardiologist: SK  Chief Complaint:     HPI Thomas [name] is a 80 y.o. male  Admitted with SOB and found to be in AFib  He had sudden onset SOB and LH over the last 2-3 days.  No changes otherwise in his salt laden diet.  He has chronic edema  He has had no chest pain  Has hx of CAD and NICM LBBB for which he underwent CRT-D implantation 2012 and has been lost to followup  At that time Left ventricular ejection fraction was 20% multiple segmental wall motion abnormalities. Left and right heart catheterization was performed which showed severe global and segmental LV dysfunction. However there was severe right coronary artery disease unchanged from prior cardiac catheterization of nonobstructive stenosis of the left main, LAD and left circumflex coronary artery.    He was started on amio last pm;  Tn 8.7 and INR 2.8        Past Medical History  Diagnosis Date  . Coronary artery disease     80% distal RCA stenosis.  . Thyroid disease     HYPOTHYROIDISM  . Nontoxic multinodular goiter   . Psychosexual dysfunction with inhibited sexual excitement     ERECTILE DYSFUNCTION  . Hypertrophy of prostate without urinary obstruction and other lower urinary tract symptoms (LUTS)     BPH  . Hypertension   . Wide-complex tachycardia   . Parkinson's disease   . Status post biventricular cardiac pacemaker insertion     Status post CRT-D.      Surgical History:  Past Surgical History  Procedure Laterality Date  . Cardiac catheterization       Home Meds: Prior to Admission medications   Medication Sig Start Date End Date Taking? Authorizing Provider  amantadine (SYMMETREL) 100 MG capsule Take 100 mg by mouth 2 (two) times daily.    Yes Historical Provider, MD    DULoxetine (CYMBALTA) 30 MG capsule Take 30 mg by mouth daily.     Yes Historical Provider, MD  metoprolol tartrate (LOPRESSOR) 25 MG tablet Take 12.5 mg by mouth 2 (two) times daily.   Yes Historical Provider, MD  Multiple Vitamins-Minerals (ICAPS MV PO) Take 2 by mouth twice daily    Yes Historical Provider, MD  Omega-3 Fatty Acids (FISH OIL) 1000 MG CAPS Take one by mouth twice daily     Yes Historical Provider, MD  rotigotine (NEUPRO) 6 MG/24HR Place 1 patch onto the skin daily.   Yes Historical Provider, MD  simvastatin (ZOCOR) 20 MG tablet Take 1 tablet (20 mg total) by mouth at bedtime. 11/09/12  Yes Donney Dice, PA-C  torsemide (DEMADEX) 100 MG tablet Take 50-100 mg by mouth daily as needed (edema).    Yes Historical Provider, MD  vitamin C (ASCORBIC ACID) 500 MG tablet Take 500 mg by mouth.   Yes Historical Provider, MD  warfarin (COUMADIN) 3 MG tablet Take 6 mg by mouth daily.   Yes Historical Provider, MD  nitroGLYCERIN (NITROSTAT) 0.4 MG SL tablet Place 0.4 mg under the tongue every 5 (five) minutes as needed.      Historical Provider, MD    Inpatient Medications:  . amantadine  100 mg Oral Daily  . aspirin EC  81 mg Oral Daily  . DULoxetine  30 mg Oral Daily  . [START  ON 06/03/2014] Influenza vac split quadrivalent PF  0.5 mL Intramuscular Tomorrow-1000  . rotigotine  1 patch Transdermal Daily  . simvastatin  20 mg Oral QHS  . sodium chloride  3 mL Intravenous Q12H  . warfarin  5 mg Oral ONCE-1800  . Warfarin - Pharmacist Dosing Inpatient   Does not apply q1800     Allergies: No Known Allergies  History   Social History  . Marital Status: Widowed    Spouse Name: N/A    Number of Children: N/A  . Years of Education: N/A   Occupational History  . RETIRED    Social History Main Topics  . Smoking status: Former Smoker -- 1.00 packs/day for 45 years    Types: Cigarettes    Quit date: 06/14/1995  . Smokeless tobacco: Never Used  . Alcohol Use: No  . Drug Use: No   . Sexual Activity: Not on file   Other Topics Concern  . Not on file   Social History Narrative     Family History  Problem Relation Age of Onset  . Heart attack Mother   . Thyroid disease Neg Hx   . Coronary artery disease Other      ROS:  Please see the history of present illness.     All other systems reviewed and negative.    Physical Exam   Blood pressure 98/62, pulse 104, temperature 98.4 F (36.9 C), temperature source Oral, resp. rate 21, height 6' (1.829 m), weight 200 lb 6.4 oz (90.9 kg), SpO2 96 %. General: Well developed, well nourished male in mod resp distress. Head: Normocephalic, atraumatic, sclera non-icteric, no xanthomas, nares are without discharge. EENT: normal Lymph Nodes:  none Back: without scoliosis/kyphosis , no CVA tendersness Neck: Negative for carotid bruits. JVD 10 +  Lungs:  Rales  bilaterally to auscultation without wheezes, rales, or rhonchi. Breathing is mildly labored. Heart: uirregularly irregular rate and rhythm with S1 S2. No  murmur , rubs, or gallops appreciated. Abdomen: Soft, non-tender, non-distended with normoactive bowel sounds. No hepatomegaly. No rebound/guarding. No obvious abdominal masses. Msk:  Strength and tone appear normal for age. Extremities: No clubbing or cyanosis.  1+ edema.  Distal pedal pulses are 2+ and equal bilaterally. Skin: Warm and Dry Neuro: Alert and oriented X 3. CN III-XII intact Grossly normal sensory and motor function . Tremor Left arm Psych:  Responds to questions appropriately with a normal affect.      Labs: Cardiac Enzymes  Recent Labs  06/02/14 0411  TROPONINI 8.74*   CBC Lab Results  Component Value Date   WBC 7.0 06/02/2014   HGB 11.2* 06/02/2014   HCT 34.8* 06/02/2014   MCV 88.1 06/02/2014   PLT 123* 06/02/2014   PROTIME:  Recent Labs  06/02/14 0411  LABPROT 30.0*  INR 2.83*   Chemistry  Recent Labs Lab 06/02/14 0411  NA 142  K 3.8  CL 101  CO2 30  BUN 37*   CREATININE 1.69*  CALCIUM 8.6  GLUCOSE 111*   Lipids No results found for: CHOL, HDL, LDLCALC, TRIG BNP PRO B NATRIURETIC PEPTIDE (BNP)  Date/Time Value Ref Range Status  10/05/2010 11:20 PM 320.0* 0.0 - 100.0 pg/mL Final   Miscellaneous No results found for: DDIMER  Radiology/Studies:  Dg Chest Port 1 View  06/02/2014   CLINICAL DATA:  Congestive heart failure.  EXAM: PORTABLE CHEST - 1 VIEW  COMPARISON:  02/08/2011.  10/06/2010.  FINDINGS: Cardiac pacer in stable position. Severe stable cardiomegaly. Pulmonary venous congestion  interstitial prominence suggest a component congestive heart failure. Underlying pneumonitis and/or chronic interstitial lung disease cannot be excluded. No acute bony abnormality.  IMPRESSION: 1. Severe cardiomegaly. Pulmonary venous congestion and interstitial prominence suggesting congestive heart failure. A component of pneumonitis and/or chronic interstitial lung disease cannot be excluded. 2. Cardiac pacer in stable position.   Electronically Signed   By: Marcello Moores  Register   On: 06/02/2014 07:17    EKG:  AFib RVR  HR 139 LBBB  Device interrogation >> Afib started 12/16   This has been an infreqeunt occurrence Self terminating VT-PM  Assessment and Plan:  A/C CHF   AFib  NICM   CRT Medtronic   Elevated troponin   VT polymorphic non sustained  The patient presents with acute on chronic heart failure that temporally coincides with the atrial fibrillation onset 12/16 as documented in his defibrillator. This is likely the culprit. The positive troponin is, I suspect, related to demand in the context of his known RCA disease.  Given the fact that his INR was therapeutic on admission, I think it is reasonable to assume that was also therapeutic 48 hours prior to that and hence, cardioversion can be undertaken without the need for TEE. Unfortunately, he ate breakfast today and this will need to be deferred until tomorrow. We will keep him on the  amiodarone for now so as to help with rate control.  The positive troponin, likely demand, does I think beg an answer. Myoview scanning might well be appropriate for risk stratification as an outpatient. The need for this is further enhanced by the evidence of polymorphic ventricular tachycardia noted on device interrogation.  I reviewed this with his primary cardiologist.  Virl Axe

## 2014-06-03 NOTE — Progress Notes (Signed)
Update post cardioversion called to [place] on [place].

## 2014-06-04 ENCOUNTER — Inpatient Hospital Stay (HOSPITAL_COMMUNITY): Payer: Medicare Other

## 2014-06-04 DIAGNOSIS — I2510 Atherosclerotic heart disease of native coronary artery without angina pectoris: Secondary | ICD-10-CM | POA: Diagnosis present

## 2014-06-04 DIAGNOSIS — N183 Chronic kidney disease, stage 3 unspecified: Secondary | ICD-10-CM | POA: Diagnosis present

## 2014-06-04 LAB — TROPONIN I
Troponin I: 1.99 ng/mL (ref <0.031)
Troponin I: 2.06 ng/mL (ref <0.031)

## 2014-06-04 LAB — BASIC METABOLIC PANEL
Anion gap: 15 (ref 5–15)
BUN: 41 mg/dL — AB (ref 6–23)
CALCIUM: 8.2 mg/dL — AB (ref 8.4–10.5)
CO2: 22 mmol/L (ref 19–32)
Chloride: 98 mEq/L (ref 96–112)
Creatinine, Ser: 1.59 mg/dL — ABNORMAL HIGH (ref 0.50–1.35)
GFR calc Af Amer: 46 mL/min — ABNORMAL LOW (ref >90)
GFR, EST NON AFRICAN AMERICAN: 40 mL/min — AB (ref >90)
Glucose, Bld: 162 mg/dL — ABNORMAL HIGH (ref 70–99)
Potassium: 3.5 mmol/L (ref 3.5–5.1)
Sodium: 135 mmol/L (ref 135–145)

## 2014-06-04 LAB — CBC
HEMATOCRIT: 34.1 % — AB (ref 39.0–52.0)
HEMATOCRIT: 37.2 % — AB (ref 39.0–52.0)
Hemoglobin: 10.8 g/dL — ABNORMAL LOW (ref 13.0–17.0)
Hemoglobin: 11.9 g/dL — ABNORMAL LOW (ref 13.0–17.0)
MCH: 27.3 pg (ref 26.0–34.0)
MCH: 28.2 pg (ref 26.0–34.0)
MCHC: 31.7 g/dL (ref 30.0–36.0)
MCHC: 32 g/dL (ref 30.0–36.0)
MCV: 86.3 fL (ref 78.0–100.0)
MCV: 88.2 fL (ref 78.0–100.0)
PLATELETS: 147 10*3/uL — AB (ref 150–400)
Platelets: 159 10*3/uL (ref 150–400)
RBC: 4.22 MIL/uL (ref 4.22–5.81)
RBC: 3.95 MIL/uL — AB (ref 4.22–5.81)
RDW: 13.6 % (ref 11.5–15.5)
RDW: 13.8 % (ref 11.5–15.5)
WBC: 9.3 10*3/uL (ref 4.0–10.5)
WBC: 6.6 10*3/uL (ref 4.0–10.5)

## 2014-06-04 LAB — PROTIME-INR
INR: 2.51 — ABNORMAL HIGH (ref 0.00–1.49)
Prothrombin Time: 27.3 seconds — ABNORMAL HIGH (ref 11.6–15.2)

## 2014-06-04 MED ORDER — CHLORHEXIDINE GLUCONATE 0.12 % MT SOLN
15.0000 mL | Freq: Two times a day (BID) | OROMUCOSAL | Status: DC
  Administered 2014-06-04 – 2014-06-07 (×5): 15 mL via OROMUCOSAL
  Filled 2014-06-04 (×10): qty 15

## 2014-06-04 MED ORDER — NITROGLYCERIN IN D5W 200-5 MCG/ML-% IV SOLN
2.0000 ug/min | INTRAVENOUS | Status: DC
  Administered 2014-06-04: 5 ug/min via INTRAVENOUS
  Filled 2014-06-04 (×2): qty 250

## 2014-06-04 MED ORDER — FUROSEMIDE 10 MG/ML IJ SOLN
40.0000 mg | Freq: Every day | INTRAMUSCULAR | Status: DC
  Administered 2014-06-04 – 2014-06-09 (×6): 40 mg via INTRAVENOUS
  Filled 2014-06-04 (×7): qty 4

## 2014-06-04 MED ORDER — WARFARIN SODIUM 6 MG PO TABS
6.0000 mg | ORAL_TABLET | Freq: Once | ORAL | Status: AC
  Administered 2014-06-04: 6 mg via ORAL
  Filled 2014-06-04: qty 1

## 2014-06-04 MED ORDER — MORPHINE SULFATE 4 MG/ML IJ SOLN
INTRAMUSCULAR | Status: AC
  Administered 2014-06-04: 3 mg via INTRAVENOUS
  Filled 2014-06-04: qty 1

## 2014-06-04 MED ORDER — FUROSEMIDE 10 MG/ML IJ SOLN
80.0000 mg | Freq: Once | INTRAMUSCULAR | Status: AC
  Administered 2014-06-04: 80 mg via INTRAVENOUS

## 2014-06-04 MED ORDER — MORPHINE SULFATE 4 MG/ML IJ SOLN
3.0000 mg | INTRAMUSCULAR | Status: DC | PRN
  Administered 2014-06-04 – 2014-06-10 (×8): 3 mg via INTRAVENOUS
  Filled 2014-06-04 (×7): qty 1

## 2014-06-04 MED ORDER — CETYLPYRIDINIUM CHLORIDE 0.05 % MT LIQD
7.0000 mL | Freq: Two times a day (BID) | OROMUCOSAL | Status: DC
  Administered 2014-06-05 – 2014-06-15 (×15): 7 mL via OROMUCOSAL

## 2014-06-04 NOTE — Significant Event (Signed)
Paged re desaturation requiring increase of oxygen from 2.5-->5L. Occurred when patient was woken up for vital signs. Developed progressive tachypnea, diaphoresis, tremors.  On exam, HR 110, BP 165/73, RR 35, 95% on NRB Gen: anxious, diaphoretic, left arm tremor Cor: Regular tachy Lungs: diffuse crackles Abd: soft, non-tender LE: trace LE edema  CXR: diffuse b/l pulm edema  Impression Flash pulm edema  Plan 80mg  IV lasix 3mg  IV morphine IV nitro gtt for BP control BiPAP

## 2014-06-04 NOTE — Progress Notes (Signed)
ANTICOAGULATION CONSULT NOTE - Follow Up Consult  Pharmacy Consult for Warfarin Indication: atrial fibrillation  No Known Allergies  Patient Measurements: Height: 6' (182.9 cm) Weight: 208 lb 5.4 oz (94.5 kg) IBW/kg (Calculated) : 77.6  Vital Signs: Temp: 97.6 F (36.4 C) (12/23 0700) Temp Source: Oral (12/23 0700) BP: 107/61 mmHg (12/23 0700) Pulse Rate: 68 (12/23 0813)  Labs:  Recent Labs  06/02/14 0411 06/02/14 1455 06/03/14 0340 06/04/14 0248  HGB 11.2*  --  10.6* 11.9*  HCT 34.8*  --  33.5* 37.2*  PLT 123*  --  123* 159  LABPROT 30.0* 28.1* 26.0* 27.3*  INR 2.83* 2.60* 2.36* 2.51*  CREATININE 1.69* 1.49* 1.57* 1.59*  TROPONINI 8.74*  --   --   --     Estimated Creatinine Clearance: 45.7 mL/min (by C-G formula based on Cr of 1.59).   Medications:  Scheduled:  . amantadine  100 mg Oral Daily  . amiodarone  200 mg Oral BID  . aspirin EC  81 mg Oral Daily  . DULoxetine  30 mg Oral Daily  . rotigotine  1 patch Transdermal Daily   And  . rotigotine  1 patch Transdermal Daily  . simvastatin  20 mg Oral QHS  . sodium chloride  3 mL Intravenous Q12H  . sodium chloride  3 mL Intravenous Q12H  . Warfarin - Pharmacist Dosing Inpatient   Does not apply q1800    Assessment: 10 yoM on warfarin 6 mg daily PTA for a possible history of Afib. Patient presented on 12/20 with SOB to [facility] ED and was found to be in Afib with RVR with supratherapeutic INR 3.3. Patient reports no missed or extra doses. Unclear whether patient received dose on 12/20. INR has remained therapeutic since admission after 1, 5 mg dose, and 1, 6 mg dose.  INR today is 2.51. Hgb and platelets remain stable.   Of note, patient was cardioverted on 12/22.  Patient was initially thought to have plans to undergo cardiac cath during this hospitalization and orders were placed to discontinue warfarin and bridge with heparin.  However, after speaking to both Dr. Rayann Heman on 12/22 and Dr. Ron Parker on 12/23, the  patient is to continue on warfarin therapy.  Patient will undergo a myoview with his cardiologist in [place] after 30 days.  Goal of Therapy:  INR 2-3 Monitor platelets by anticoagulation protocol: Yes   Plan:  Give Warfarin 6 mg X 1 tonight. Follow up daily INR, CBC, signs of bleeding   Theron Arista, PharmD Clinical Pharmacist - Resident Pager: [phone] 12/23/20159:15 AM

## 2014-06-04 NOTE — Progress Notes (Signed)
Physical Therapy Evaluation Patient Details Name: Thomas [name] MRN: [id number] DOB: 05/01/1934 Today's Date: 06/04/2014   History of Present Illness  80 y.o. male with a history of CAD and mostly non-ischemic cardiomypathy, s/p CRT-D, parkinsons disease, who presents with 1 day of SOB.  He presented to [facility] ED, and was found to be in AFib with RVR and with an elevated troponin of 5.    Clinical Impression  Patient demonstrates deficits in functional mobility as indicated below. Will need continued skilled PT to address deficits and maximize function. Will see as indicated and progress as tolerated. OF NOTE: HR fluctuations and arrythmias noted, elevated HR elevated to 200s reported on monitor, then [place] HR reported while sitting EOB, spoke with nursing, will hold further mobility at this time secondary to patient HR irregularity and events of past 24 hrs     Follow Up Recommendations Home health PT;Supervision/Assistance - 24 hour (Will need further asessment pedning progress and procedures)    Equipment Recommendations  None recommended by PT    Recommendations for Other Services       Precautions / Restrictions Precautions Precautions: Fall Restrictions Weight Bearing Restrictions: No      Mobility  Bed Mobility Overal bed mobility: Needs Assistance Bed Mobility: Rolling;Sidelying to Sit Rolling: Min assist Sidelying to sit: Mod assist       General bed mobility comments: VCs for sequencing and technique, assist for elevation to upright  Transfers Overall transfer level: Needs assistance Equipment used: Rolling walker (2 wheeled) Transfers: Sit to/from Stand              Ambulation/Gait                Stairs            Wheelchair Mobility    Modified Rankin (Stroke Patients Only)       Balance                                             Pertinent Vitals/Pain Pain Assessment: No/denies pain    Home  Living Family/patient expects to be discharged to:: Unsure Living Arrangements: Alone   Type of Home: House Home Access: Stairs to enter              Prior Function Level of Independence: Independent               Hand Dominance   Dominant Hand: Right    Extremity/Trunk Assessment               Lower Extremity Assessment: Generalized weakness         Communication   Communication: HOH  Cognition Arousal/Alertness: Awake/alert Behavior During Therapy: Restless                        General Comments General comments (skin integrity, edema, etc.): BP stable, however, HR fluctuations and arrythmias noted, elevated HR elevated to 200s reported on monitor, then [place] HR reported while sitting EOB, spoke with nursing, will hold further mobility at this time secondary to patient HR irregularity and events of past 24 hrs (flash pulmonary edema last night)    Exercises        Assessment/Plan    PT Assessment Patient needs continued PT services  PT Diagnosis Difficulty walking   PT Problem List Decreased strength;Decreased activity tolerance;Decreased  balance;Decreased mobility;Decreased safety awareness  PT Treatment Interventions DME instruction;Gait training;Stair training;Functional mobility training;Therapeutic activities;Therapeutic exercise;Patient/family education   PT Goals (Current goals can be found in the Care Plan section) Acute Rehab PT Goals Patient Stated Goal: to get with walk and go home PT Goal Formulation: With patient/family Time For Goal Achievement: 06/18/14 Potential to Achieve Goals: Good    Frequency Min 3X/week   Barriers to discharge        Co-evaluation               End of Session Equipment Utilized During Treatment: Gait belt Activity Tolerance: Treatment limited secondary to medical complications (Comment) Patient left: in bed;with call bell/phone within reach;with family/visitor present Nurse  Communication: Mobility status         Time: 1201-1217 PT Time Calculation (min) (ACUTE ONLY): 16 min   Charges:   PT Evaluation $Initial PT Evaluation Tier I: 1 Procedure PT Treatments $Therapeutic Activity: 8-22 mins   PT G CodesDuncan Dull 06/04/2014, 12:58 PM Alben Deeds, [place] DPT  [phone]

## 2014-06-04 NOTE — Progress Notes (Signed)
Pt resting well on nasal cannula denies SOB and does not qwant to go on BiPAPm at this time, RT informed pt that if he felt SOB later could place back on BiPAP if needed

## 2014-06-04 NOTE — Progress Notes (Signed)
Subjective:   Dr. Caryl Comes had spoken with the patient's primary cardiologist in [place]. It was their feeling, that the patient could probably have further ischemic workup with a nuclear scan as an outpatient after his discharge. With this in mind he underwent cardioversion yesterday that was successful. However during the evening last night he developed worsening shortness of breath. He received BiPAP for a while and was given IV Lasix. There has been diuresis and he is looking somewhat better today.  Objective:  Vital Signs in the last 24 hours: Temp:  [98.4 F (36.9 C)-100.1 F (37.8 C)] 98.6 F (37 C) (12/23 0439) Pulse Rate:  [65-117] 65 (12/23 0700) Resp:  [14-30] 14 (12/23 0700) BP: (92-163)/(50-90) 107/61 mmHg (12/23 0700) SpO2:  [92 %-100 %] 98 % (12/23 0700) FiO2 (%):  [60 %] 60 % (12/23 0428) Weight:  [208 lb 5.4 oz (94.5 kg)] 208 lb 5.4 oz (94.5 kg) (12/23 0439)  Intake/Output from previous day:  Intake/Output Summary (Last 24 hours) at 06/04/14 5427 Last data filed at 06/04/14 0600  Gross per 24 hour  Intake  329.5 ml  Output   1025 ml  Net -695.5 ml    Physical Exam: General appearance: alert, cooperative, mildly obese and no respiratory distress at rest Lungs: decreased bilat Heart: regular rate and rhythm Extremities: trace edema There is trace peripheral edema. The patient's son is in the room.  Rate: 70  Rhythm: Paced, appears to be NSR. He has had runs of NSWCT-up to 5 bts  Lab Results:  Recent Labs  06/03/14 0340 06/04/14 0248  WBC 7.3 9.3  HGB 10.6* 11.9*  PLT 123* 159    Recent Labs  06/03/14 0340 06/04/14 0248  NA 138 135  K 3.7 3.5  CL 103 98  CO2 29 22  GLUCOSE 115* 162*  BUN 35* 41*  CREATININE 1.57* 1.59*    Recent Labs  06/02/14 0411  TROPONINI 8.74*    Recent Labs  06/04/14 0248  INR 2.51*    Imaging: Dg Chest Port 1 View  06/04/2014   CLINICAL DATA:  Sudden onset shortness of breath  EXAM: PORTABLE CHEST  - 1 VIEW  COMPARISON:  06/02/2014  FINDINGS: Worsening perihilar airspace opacities suggest progressive edema. Small pleural effusions partly visualized. Cardiomegaly reidentified. Left AICD in place.  IMPRESSION: Progressive probable pulmonary edema.   Electronically Signed   By: Conchita Paris M.D.   On: 06/04/2014 02:52    Cardiac Studies: Echo 06/02/14 Study Conclusions  - Left ventricle: The cavity size was normal. Wall thickness was increased in a pattern of mild LVH. There was mild focal basal hypertrophy of the septum. Systolic function was severely reduced. The estimated ejection fraction was in the range of 25% to 30%. Diffuse hypokinesis. - Mitral valve: Calcified annulus. There was mild regurgitation. - Left atrium: The atrium was moderately dilated. - Pulmonary arteries: Systolic pressure was mildly increased. PA peak pressure: 39 mm Hg (S).  Impressions:  - Severe global reduction in LV function; moderate LAE; mild MR; mildly elevated pulmonary pressures.  Assessment/Plan:  80 y/o with a hx of single vessel CAD (RCA), NICM, and LBBB for which he underwent MDT CRT-D implantation in 2012. He has been lost to follow up. He presented 06/01/14 to [facility] with AF with RVR, CHF, and elevated Troponin. He underwent DCCV to NSR 06/03/14. The decision about how aggressive to be with his in-hospital ischemic workup continues to vary based on his course. 2 days ago we felt  that he could probably have a follow-up outpatient nuclear scan. However last night his episode of shortness of breath may have been consistent with flash edema from ischemia. Further troponins will be checked. We are stabilizing his volume status. We will decide on a daily basis as to whether or not catheterization should be done this hospitalization. The orders have varied concerning Coumadin and heparin. This is because we have repeatedly changed directions concerning whether cath would be done or not. As  of this morning, considering the events of last night, I suspect we may need to proceed with catheterization. Therefore I will hold Coumadin going forward from 12:06 PM today until further decisions are made. Heparin will be used if his INR drops below 2.0.     Acute systolic congestive heart failure        Initially he presented with acute systolic CHF. This was in association with rapid atrial fibrillation. He was improving and he was cardioverted to sinus. He had a worsening acute episode last night. It is possible that this could be due to ischemia. We are diuresing him carefully and checking further troponins.    Atrial fibrillation with RVR- DCCV 06/03/14     He was cardioverted on December 22. He is holding sinus rhythm and oral amiodarone is being continued.    NSTEMI- Troponin 8    We will check further troponins to see if there has been evidence of further ischemia last night.    Non-ischemic cardiomyopathy-EF 25-30%    Historically his left ventricular dysfunction was out of proportion to his coronary disease. This is why he has been labeled a nonischemic cardiomyopathy.    MDT BiV ICD 2012     This was originally put in by our team here in [place]. He is followed by Dr. Truman Hayward in [place]     NSVT (nonsustained ventricular tachycardia)    Chronic renal insufficiency, stage III (moderate)    CAD-single vessel (RCA) in 2012    LBBB (left bundle branch block)   Parkinson's disease    CAD    In the past he has known coronary disease. However his left ventricular dysfunction was out of proportion to his coronary disease. This is why he is been labeled a nonischemic cardiomyopathy. With this admission he had a troponin up to 8. We are continuing to determine whether we should proceed with catheterization this admission or not.  Kerin Ransom PA-C Beeper [phone] 06/04/2014, 8:08 AM  Patient seen and examined. I agree with the assessment and plan as detailed above. See also my  additional thoughts below.   I spoke with the patient's son today in the room. In addition, the assessment and plan above is rewritten by me.  Dola Argyle, MD, [facility] 06/04/2014 12:14 PM

## 2014-06-04 NOTE — Progress Notes (Signed)
**Note De-Identified Thomas [name] Obfuscation** Patient removed from BIPAP by MD and placed on 4L [place].  RRT to cont. To monitor.

## 2014-06-05 DIAGNOSIS — Z7901 Long term (current) use of anticoagulants: Secondary | ICD-10-CM

## 2014-06-05 LAB — BASIC METABOLIC PANEL
ANION GAP: 7 (ref 5–15)
BUN: 41 mg/dL — ABNORMAL HIGH (ref 6–23)
CHLORIDE: 101 meq/L (ref 96–112)
CO2: 30 mmol/L (ref 19–32)
CREATININE: 1.33 mg/dL (ref 0.50–1.35)
Calcium: 8.6 mg/dL (ref 8.4–10.5)
GFR calc Af Amer: 57 mL/min — ABNORMAL LOW (ref >90)
GFR calc non Af Amer: 50 mL/min — ABNORMAL LOW (ref >90)
Glucose, Bld: 136 mg/dL — ABNORMAL HIGH (ref 70–99)
Potassium: 3.7 mmol/L (ref 3.5–5.1)
Sodium: 138 mmol/L (ref 135–145)

## 2014-06-05 LAB — MAGNESIUM: Magnesium: 2 mg/dL (ref 1.5–2.5)

## 2014-06-05 LAB — PROTIME-INR
INR: 3.66 — ABNORMAL HIGH (ref 0.00–1.49)
Prothrombin Time: 36.6 seconds — ABNORMAL HIGH (ref 11.6–15.2)

## 2014-06-05 LAB — TROPONIN I: TROPONIN I: 2.05 ng/mL — AB (ref <0.031)

## 2014-06-05 MED ORDER — POTASSIUM CHLORIDE CRYS ER 20 MEQ PO TBCR
20.0000 meq | EXTENDED_RELEASE_TABLET | Freq: Two times a day (BID) | ORAL | Status: DC
  Administered 2014-06-06 – 2014-06-10 (×10): 20 meq via ORAL
  Filled 2014-06-05 (×13): qty 1

## 2014-06-05 MED ORDER — ISOSORBIDE MONONITRATE ER 30 MG PO TB24
30.0000 mg | ORAL_TABLET | Freq: Every day | ORAL | Status: DC
  Administered 2014-06-05 – 2014-06-11 (×7): 30 mg via ORAL
  Filled 2014-06-05 (×8): qty 1

## 2014-06-05 MED ORDER — POTASSIUM CHLORIDE CRYS ER 20 MEQ PO TBCR
40.0000 meq | EXTENDED_RELEASE_TABLET | Freq: Once | ORAL | Status: AC
  Administered 2014-06-05: 40 meq via ORAL
  Filled 2014-06-05: qty 2

## 2014-06-05 MED ORDER — MAGNESIUM HYDROXIDE 400 MG/5ML PO SUSP
30.0000 mL | Freq: Once | ORAL | Status: AC
  Administered 2014-06-05: 30 mL via ORAL
  Filled 2014-06-05: qty 30

## 2014-06-05 NOTE — Progress Notes (Signed)
Physical Therapy Treatment Patient Details Name: Thomas [name] MRN: [id number] DOB: [birth year] Today's Date: 06/05/2014    History of Present Illness 80 y.o. male with a history of CAD and mostly non-ischemic cardiomypathy, s/p CRT-D, parkinsons disease, who presents with 1 day of SOB.  He presented to [facility] ED, and was found to be in AFib with RVR and with an elevated troponin of 5.      PT Comments    Returned to patient room after trial of OOB.  VS stable after >1 hour OOB, patient assisted with ambulation. Demonstrates instability and generalized weakness with ambulation. Overall very deconditioned but tolerated well with multiple rest breaks.  Returned to room, VS assessed again and remained stable on 3 liters supplemental O2.  Patient assisted with positioning for edema control and discussed ambulation and mobility expectations. Encouraged patient to ask staff to ambulate. Will continue to see and progress as tolerated. Given level of deconditioning, patient may benefit from CIR consult pending progress and further possibility of procedural interventions during admission.   Follow Up Recommendations  CIR;Supervision/Assistance - 24 hour     Equipment Recommendations  None recommended by PT    Recommendations for Other Services       Precautions / Restrictions Precautions Precautions: Fall Restrictions Weight Bearing Restrictions: No    Mobility  Bed Mobility                  Transfers Overall transfer level: Needs assistance Equipment used: Rolling walker (2 wheeled) Transfers: Sit to/from [facility] Sit to Stand: Mod assist Stand pivot transfers: Mod assist       General transfer comment: VCs for hand placement and assist +2 to elevate to standing from chair surface.  Ambulation/Gait Ambulation/Gait assistance: Mod assist Ambulation Distance (Feet): 110 Feet Assistive device: Rolling walker (2 wheeled) Gait  Pattern/deviations: Step-through pattern;Decreased stride length;Festinating;Drifts right/left;Trunk flexed;Leaning posteriorly Gait velocity: Decreased Gait velocity interpretation: <1.8 ft/sec, indicative of risk for recurrent falls General Gait Details: patient very unsteady with gait, difficulty initiated secondary to PD, patient with posterior lean requires manual cues for anterior facilitation, moderate assist for steering and positioning with RW while maintaining stability.   Stairs            Wheelchair Mobility    Modified Rankin (Stroke Patients Only)       Balance                                    Cognition Arousal/Alertness: Awake/alert Behavior During Therapy: WFL for tasks assessed/performed Overall Cognitive Status: Within Functional Limits for tasks assessed                      Exercises      General Comments General comments (skin integrity, edema, etc.): VSS with pacing HR 70s, SpO2 on 3 liters during ambulation remained stable >90%.        Pertinent Vitals/Pain Pain Assessment: No/denies pain    Home Living                      Prior Function            PT Goals (current goals can now be found in the care plan section) Acute Rehab PT Goals Patient Stated Goal: to get with walk and go home PT Goal Formulation: With patient/family Time For Goal Achievement: 06/18/14 Potential to Achieve Goals:  Good Progress towards PT goals: Progressing toward goals    Frequency  Min 3X/week    PT Plan Current plan remains appropriate    Co-evaluation             End of Session Equipment Utilized During Treatment: Gait belt Activity Tolerance:Patient tolerated treatment well.  Patient left: in chair;with call bell/phone within reach     Time: 1356-1422 PT Time Calculation (min) (ACUTE ONLY): 26 min  Charges:  $Gait Training: 8-22 mins $Therapeutic Activity: 8-22 mins                    G CodesDuncan Dull 2014/06/14, 2:50 PM Alben Deeds, [place] DPT  [phone]

## 2014-06-05 NOTE — Progress Notes (Signed)
Pt resting well on nasal cannula and does not want to go on BiPAP at this time, RT informed pt if he felt he needed to go on later RT would come help

## 2014-06-05 NOTE — Progress Notes (Addendum)
Physical Therapy Treatment Patient Details Name: Thomas [name] MRN: [id number] DOB: [birth year] Today's Date: July 05, 2014    History of Present Illness 80 y.o. male with a history of CAD and mostly non-ischemic cardiomypathy, s/p CRT-D, parkinsons disease, who presents with 1 day of SOB.  He presented to [facility] ED, and was found to be in AFib with RVR and with an elevated troponin of 5.      PT Comments    Patient tolerated OOB to chair this session, advised will allow patient to sit through lunch, if vitals remain stable, will return and progress activity for ambulation. Patient in agreement.  Follow Up Recommendations  CIR;Supervision/Assistance - 24 hour     Equipment Recommendations  None recommended by PT    Recommendations for Other Services       Precautions / Restrictions Precautions Precautions: Fall Restrictions Weight Bearing Restrictions: No    Mobility  Bed Mobility Overal bed mobility: Needs Assistance Bed Mobility: Rolling;Sidelying to Sit Rolling: Min assist Sidelying to sit: Mod assist       General bed mobility comments: VCs for sequencing and technique, assist for elevation to upright  Transfers Overall transfer level: Needs assistance Equipment used: Rolling walker (2 wheeled) Transfers: Sit to/from [facility] Sit to Stand: Mod assist Stand pivot transfers: Mod assist       General transfer comment: Assist for stability, pivot to chair  Ambulation/Gait                 Stairs            Wheelchair Mobility    Modified Rankin (Stroke Patients Only)       Balance                                    Cognition Arousal/Alertness: Awake/alert Behavior During Therapy: WFL for tasks assessed/performed Overall Cognitive Status: Within Functional Limits for tasks assessed                      Exercises      General Comments General comments (skin integrity, edema, etc.): VS  assessed several times during activity and remain stable with HR paced 70s.      Pertinent Vitals/Pain Pain Assessment: No/denies pain    Home Living                      Prior Function            PT Goals (current goals can now be found in the care plan section) Acute Rehab PT Goals Patient Stated Goal: to get with walk and go home PT Goal Formulation: With patient/family Time For Goal Achievement: 06/18/14 Potential to Achieve Goals: Good Progress towards PT goals: Progressing toward goals    Frequency  Min 3X/week    PT Plan Current plan remains appropriate    Co-evaluation             End of Session Equipment Utilized During Treatment: Gait belt Activity Tolerance: Tolerated treatment well Patient left: in bed;with call bell/phone within reach;with family/visitor present     Time: 9794-8016 PT Time Calculation (min) (ACUTE ONLY): 21 min  Charges:  $Therapeutic Activity: 8-22 mins                    G CodesDuncan Dull 05-Jul-2014, 2:36 PM Alben Deeds,  PT DPT  [phone]

## 2014-06-05 NOTE — Progress Notes (Signed)
Patient was v-paced at the beginning of the shift. Around 2300, he started a-pacing and v-pacing. Also, pacer failing to capture at times which therefore lead to frequent runs of PVC's/a few runs of vtach (5-9 beats). Patient asymptomatic and resting comfortably at this time. MD notified. BMP and mag ordered. K+ is 3.7. Potassium 65meq ordered and given.  Will continue to monitor and notify MD if needed.  M.Forest Gleason, RN

## 2014-06-05 NOTE — Progress Notes (Signed)
Subjective:  Awake and alert this am. He wants to get out of bed. No chest pain.  Objective:  Vital Signs in the last 24 hours: Temp:  [97.8 F (36.6 C)-98.4 F (36.9 C)] 97.9 F (36.6 C) (12/24 0804) Pulse Rate:  [58-69] 66 (12/24 0804) Resp:  [12-21] 15 (12/24 0804) BP: (102-125)/(58-84) 102/59 mmHg (12/24 0804) SpO2:  [92 %-98 %] 97 % (12/24 0804) Weight:  [207 lb 0.2 oz (93.9 kg)] 207 lb 0.2 oz (93.9 kg) (12/24 0010)  Intake/Output from previous day:  Intake/Output Summary (Last 24 hours) at 06/05/14 0811 Last data filed at 06/05/14 0805  Gross per 24 hour  Intake 1563.9 ml  Output   2375 ml  Net -811.1 ml    Physical Exam: General appearance: alert, cooperative and no distress Lungs: decreased at bases but no rales or wheezing Heart: regular rate and rhythm Extremities: no edema   Rate: 70  Rhythm: normal sinus rhythm, pacing on demand, occasional PVC and fusion beats.  He did have some runs of NSWCT last PM.   Lab Results:  Recent Labs  06/04/14 0248 06/04/14 2334  WBC 9.3 6.6  HGB 11.9* 10.8*  PLT 159 147*    Recent Labs  06/04/14 0248 06/04/14 2334  NA 135 138  K 3.5 3.7  CL 98 101  CO2 22 30  GLUCOSE 162* 136*  BUN 41* 41*  CREATININE 1.59* 1.33    Recent Labs  06/04/14 1936 06/04/14 2334  TROPONINI 2.06* 2.05*    Recent Labs  06/04/14 2334  INR 3.66*    Imaging: Imaging results have been reviewed   Assessment/Plan:  80 y/o with a hx of single vessel CAD (RCA), NICM, and LBBB for which he underwent MDT CRT-D implantation in 2012. He has been followed in [place]. He presented 06/01/14 to [facility] with AF with RVR, CHF, and elevated Troponin (peaked at 8). He underwent DCCV to NSR 06/03/14. The decision about how aggressive to be with his in-hospital ischemic workup continues to vary based on his course. 2 days ago we felt that he could probably have a follow-up outpatient nuclear scan. However 12/22 he had an episode of  shortness of breath may have been consistent with flash edema from ischemia. Further troponins have been stable- 2.. We are stabilizing his volume status. We will decide on a daily basis as to whether or not catheterization should be done this hospitalization. The orders have varied concerning Coumadin and heparin. This is because we have repeatedly changed directions concerning whether cath would be done or not. As of this morning, considering the events of last night, we may need to proceed with catheterization. Coumadin held by Dr Ron Parker yesterday until further decisions are made. Heparin will be used if his INR drops below 2.0.    Acute systolic congestive heart failure    Atrial fibrillation with RVR- DCCV 06/03/14      The patient is holding sinus rhythm since his cardioversion.    Non-ischemic cardiomyopathy-EF 25-30%   MDT BiV ICD 2012   NSVT (nonsustained ventricular tachycardia)   Chronic renal insufficiency, stage III (moderate    CAD-single vessel (RCA) in 2012    I remain concerned that the episode the other night was possibly ischemic mediated flash pulmonary edema. With this in mind, I favor completely stabilizing the patient and proceeding with catheterization before he goes home.    LBBB (left bundle branch block)   Parkinson's disease    Warfarin anticoagulation.  The patient came in on Coumadin. Because of changing plans, we have varied on the approach to whether Coumadin would be in to need her stopped during the hospitalization. I have now decided to hold Coumadin and use heparin eventually when his INR is less than 2.0. This is being done in preparation for cardiac catheterization this admission.   PLAN: Wean off IV NTG, PT consult to mobilize. He apparently recieved dose of Coumadin yesterday, INR today is 3.66. He is on Lasix 40 mg IV daily, add K+.  Kerin Ransom PA-C Beeper [phone] 06/05/2014, 8:11 AM  Patient seen and examined. I agree with the assessment and  plan as detailed above. See also my additional thoughts below.   I have written a significant part of the assessment and plan above. He is more stable today. We do hope to try to get him out of bed today.  Dola Argyle, MD, [facility] 06/05/2014 9:05 AM

## 2014-06-05 NOTE — Progress Notes (Addendum)
Rehab Admissions Coordinator Note:  Patient was screened by Cleatrice Burke for appropriateness for an Inpatient Acute Rehab Consult per PT recommendation. At this time, we are recommending Inpatient Rehab consult. Please place order for consult.  Cleatrice Burke 06/05/2014, 4:09 PM  I can be reached at [phone].

## 2014-06-06 ENCOUNTER — Inpatient Hospital Stay (HOSPITAL_COMMUNITY): Payer: Medicare Other

## 2014-06-06 DIAGNOSIS — Z79899 Other long term (current) drug therapy: Secondary | ICD-10-CM

## 2014-06-06 DIAGNOSIS — I5021 Acute systolic (congestive) heart failure: Secondary | ICD-10-CM

## 2014-06-06 LAB — CBC
HCT: 33.4 % — ABNORMAL LOW (ref 39.0–52.0)
HEMOGLOBIN: 10.4 g/dL — AB (ref 13.0–17.0)
MCH: 26.7 pg (ref 26.0–34.0)
MCHC: 31.1 g/dL (ref 30.0–36.0)
MCV: 85.6 fL (ref 78.0–100.0)
Platelets: 165 10*3/uL (ref 150–400)
RBC: 3.9 MIL/uL — AB (ref 4.22–5.81)
RDW: 13.4 % (ref 11.5–15.5)
WBC: 5.5 10*3/uL (ref 4.0–10.5)

## 2014-06-06 LAB — PROTIME-INR
INR: 3.95 — AB (ref 0.00–1.49)
Prothrombin Time: 38.9 seconds — ABNORMAL HIGH (ref 11.6–15.2)

## 2014-06-06 NOTE — Progress Notes (Signed)
Pt seen, currently on 1.5lnc, HR63, rr14, spo2 96%.  No respiratory distress or increased wob noted.  Bipap not indicated at this time.  RT will monitor and assess as needed.

## 2014-06-06 NOTE — Progress Notes (Signed)
Patient ID: Thomas [name], male   DOB: [birth year], 80 y.o.   MRN: [id number]    SUBJECTIVE:  Patient is diuresing. Renal function is looking better. Appreciate the help from physical therapy. Patient is getting a little stronger. He is holding sinus rhythm with a paced rhythm since his cardioversion.   Filed Vitals:   06/05/14 2031 06/06/14 0012 06/06/14 0308 06/06/14 0440  BP: 102/54 110/64  109/52  Pulse: 65 63  61  Temp: 98.6 F (37 C) 98.5 F (36.9 C)  98.7 F (37.1 C)  TempSrc: Oral Oral  Oral  Resp: 24 18  27   Height:      Weight:   208 lb 5.4 oz (94.5 kg)   SpO2: 99% 96%  94%     Intake/Output Summary (Last 24 hours) at 06/06/14 6962 Last data filed at 06/06/14 0600  Gross per 24 hour  Intake 814.53 ml  Output   2450 ml  Net -1635.47 ml    LABS: Basic Metabolic Panel:  Recent Labs  06/04/14 0248 06/04/14 2315 06/04/14 2334  NA 135  --  138  K 3.5  --  3.7  CL 98  --  101  CO2 22  --  30  GLUCOSE 162*  --  136*  BUN 41*  --  41*  CREATININE 1.59*  --  1.33  CALCIUM 8.2*  --  8.6  MG  --  2.0  --    Liver Function Tests: No results for input(s): AST, ALT, ALKPHOS, BILITOT, PROT, ALBUMIN in the last 72 hours. No results for input(s): LIPASE, AMYLASE in the last 72 hours. CBC:  Recent Labs  06/04/14 2334 06/06/14 0329  WBC 6.6 5.5  HGB 10.8* 10.4*  HCT 34.1* 33.4*  MCV 86.3 85.6  PLT 147* 165   Cardiac Enzymes:  Recent Labs  06/04/14 1239 06/04/14 1936 06/04/14 2334  TROPONINI 1.99* 2.06* 2.05*   BNP: Invalid input(s): POCBNP D-Dimer: No results for input(s): DDIMER in the last 72 hours. Hemoglobin A1C: No results for input(s): HGBA1C in the last 72 hours. Fasting Lipid Panel: No results for input(s): CHOL, HDL, LDLCALC, TRIG, CHOLHDL, LDLDIRECT in the last 72 hours. Thyroid Function Tests: No results for input(s): TSH, T4TOTAL, T3FREE, THYROIDAB in the last 72 hours.  Invalid input(s): FREET3  RADIOLOGY: Dg Chest Port 1  View  06/04/2014   CLINICAL DATA:  Sudden onset shortness of breath  EXAM: PORTABLE CHEST - 1 VIEW  COMPARISON:  06/02/2014  FINDINGS: Worsening perihilar airspace opacities suggest progressive edema. Small pleural effusions partly visualized. Cardiomegaly reidentified. Left AICD in place.  IMPRESSION: Progressive probable pulmonary edema.   Electronically Signed   By: Conchita Paris M.D.   On: 06/04/2014 02:52   Dg Chest Port 1 View  06/02/2014   CLINICAL DATA:  Congestive heart failure.  EXAM: PORTABLE CHEST - 1 VIEW  COMPARISON:  02/08/2011.  10/06/2010.  FINDINGS: Cardiac pacer in stable position. Severe stable cardiomegaly. Pulmonary venous congestion interstitial prominence suggest a component congestive heart failure. Underlying pneumonitis and/or chronic interstitial lung disease cannot be excluded. No acute bony abnormality.  IMPRESSION: 1. Severe cardiomegaly. Pulmonary venous congestion and interstitial prominence suggesting congestive heart failure. A component of pneumonitis and/or chronic interstitial lung disease cannot be excluded. 2. Cardiac pacer in stable position.   Electronically Signed   By: Marcello Moores  Register   On: 06/02/2014 07:17    PHYSICAL EXAM  patient is oriented to person time and place. Affect is normal. Head is atraumatic.  Sclera and conjunctiva are normal. There is no jugular venous distention. Lungs reveal scattered rhonchi. Cardiac exam reveals S1 and S2. There is no significant peripheral edema.   TELEMETRY:  I have reviewed telemetry today June 06, 2014. He is pacing and appears to be in sinus rhythm.   ASSESSMENT AND PLAN:    Acute systolic congestive heart failure    Patient presented with acute CHF this admission. He had rapid atrial fib at that time. Ultimately he did better with diuresis and cardioversion. However he did have an episode of worsening of his CHF. I feel that they have been ischemic in origin. Catheterization is to be done this admission.  The timing will depend on his course. He is on a daily diuretic. I have ordered a 1 view chest x-ray for today.    LBBB (left bundle branch block)    Non-ischemic cardiomyopathy-EF 25-30%       Originally his catheterization years ago did show mild coronary disease. LV dysfunction was out of proportion to his coronary disease and this was called a nonischemic cardiomyopathy.    MDT BiV ICD 2012    Atrial fibrillation with RVR- DCCV 06/03/14    This was a new diagnosis this admission. He was cardioverted and is holding sinus rhythm. Amiodarone is to be continued.        NSVT (nonsustained ventricular tachycardia)    Chronic renal insufficiency, stage III (moderate)     Renal function actually looks better today with diuresis.     CAD-single vessel (RCA) in 2012      Patient presented with a troponin of 8.  Initially, the plan was for him to have outpatient follow-up with his physician in [place]. However he had an episode the other night that I believe may have been ischemic flash pulmonary edema. Therefore I change the plan. His Coumadin is being held. As he continues to stabilize and his INR comes down, he will undergo cardiac catheterization this admission.    Warfarin anticoagulation    Coumadin is on hold. However his INR is even slightly higher today. I'm hopeful that the INR will begin to come down. Catheterization will be done when the INR has normalized. He will need heparin coverage as he was cardioverted this admission.    On amiodarone therapy      Patient is receiving oral amiodarone at this time and it will be continued.  Dola Argyle 06/06/2014 7:12 AM

## 2014-06-07 DIAGNOSIS — I2510 Atherosclerotic heart disease of native coronary artery without angina pectoris: Secondary | ICD-10-CM

## 2014-06-07 LAB — CBC
HCT: 33.6 % — ABNORMAL LOW (ref 39.0–52.0)
Hemoglobin: 10.6 g/dL — ABNORMAL LOW (ref 13.0–17.0)
MCH: 27.1 pg (ref 26.0–34.0)
MCHC: 31.5 g/dL (ref 30.0–36.0)
MCV: 85.9 fL (ref 78.0–100.0)
Platelets: 184 10*3/uL (ref 150–400)
RBC: 3.91 MIL/uL — ABNORMAL LOW (ref 4.22–5.81)
RDW: 13.6 % (ref 11.5–15.5)
WBC: 5.7 10*3/uL (ref 4.0–10.5)

## 2014-06-07 LAB — BASIC METABOLIC PANEL
Anion gap: 8 (ref 5–15)
BUN: 28 mg/dL — ABNORMAL HIGH (ref 6–23)
CHLORIDE: 101 meq/L (ref 96–112)
CO2: 31 mmol/L (ref 19–32)
Calcium: 8.4 mg/dL (ref 8.4–10.5)
Creatinine, Ser: 1.32 mg/dL (ref 0.50–1.35)
GFR calc non Af Amer: 50 mL/min — ABNORMAL LOW (ref >90)
GFR, EST AFRICAN AMERICAN: 58 mL/min — AB (ref >90)
Glucose, Bld: 176 mg/dL — ABNORMAL HIGH (ref 70–99)
Potassium: 3.9 mmol/L (ref 3.5–5.1)
Sodium: 140 mmol/L (ref 135–145)

## 2014-06-07 LAB — PROTIME-INR
INR: 3.15 — ABNORMAL HIGH (ref 0.00–1.49)
PROTHROMBIN TIME: 32.6 s — AB (ref 11.6–15.2)

## 2014-06-07 MED ORDER — LISINOPRIL 2.5 MG PO TABS
2.5000 mg | ORAL_TABLET | Freq: Every day | ORAL | Status: DC
  Administered 2014-06-08 – 2014-06-10 (×3): 2.5 mg via ORAL
  Filled 2014-06-07 (×4): qty 1

## 2014-06-07 MED ORDER — METOPROLOL SUCCINATE 12.5 MG HALF TABLET
12.5000 mg | ORAL_TABLET | Freq: Every day | ORAL | Status: DC
  Administered 2014-06-07 – 2014-06-15 (×9): 12.5 mg via ORAL
  Filled 2014-06-07 (×11): qty 1

## 2014-06-07 MED ORDER — ATORVASTATIN CALCIUM 40 MG PO TABS
40.0000 mg | ORAL_TABLET | Freq: Every day | ORAL | Status: DC
  Administered 2014-06-07 – 2014-06-15 (×8): 40 mg via ORAL
  Filled 2014-06-07 (×9): qty 1

## 2014-06-07 NOTE — Progress Notes (Addendum)
Pt resting comfortably, currently on 1.5lnc, HR62, rr14, spo2 95%.  No respiratory distress or increased wob noted.  Bipap not indicated at this time.  RT will continue to monitor and assess as needed.

## 2014-06-07 NOTE — Progress Notes (Signed)
amb approx 150 ft in hallway using portable 02; tol well.

## 2014-06-07 NOTE — Progress Notes (Signed)
Patient ID: Thomas [name], male   DOB: [birth year], 80 y.o.   MRN: [id number]    SUBJECTIVE:  Patient denies CP; dyspnea improving; frustrated with continuing to be in hospital.   Filed Vitals:   06/07/14 0100 06/07/14 0300 06/07/14 0342 06/07/14 0809  BP: 109/53 112/58 112/58 111/55  Pulse: 59 60 65 64  Temp:   97.6 F (36.4 C) 98.3 F (36.8 C)  TempSrc:   Oral Oral  Resp: 17 19 25 16   Height:      Weight:      SpO2: 95% 96% 94% 99%     Intake/Output Summary (Last 24 hours) at 06/07/14 1050 Last data filed at 06/07/14 0815  Gross per 24 hour  Intake    725 ml  Output   2550 ml  Net  -1825 ml    LABS: Basic Metabolic Panel:  Recent Labs  06/04/14 2315 06/04/14 2334 06/07/14 0342  NA  --  138 140  K  --  3.7 3.9  CL  --  101 101  CO2  --  30 31  GLUCOSE  --  136* 176*  BUN  --  41* 28*  CREATININE  --  1.33 1.32  CALCIUM  --  8.6 8.4  MG 2.0  --   --    CBC:  Recent Labs  06/06/14 0329 06/07/14 0342  WBC 5.5 5.7  HGB 10.4* 10.6*  HCT 33.4* 33.6*  MCV 85.6 85.9  PLT 165 184   Cardiac Enzymes:  Recent Labs  06/04/14 1239 06/04/14 1936 06/04/14 2334  TROPONINI 1.99* 2.06* 2.05*    RADIOLOGY: Dg Chest Port 1 View  06/06/2014   CLINICAL DATA:  Hypertension. Coronary artery disease. Congestive heart failure.  EXAM: PORTABLE CHEST - 1 VIEW  COMPARISON:  06/04/2014  FINDINGS: Improved but not resolved bilateral perihilar airspace opacities. The patient is rotated to the Right on today's radiograph, reducing diagnostic sensitivity and specificity. Biapical pleural parenchymal scarring. Bilateral interstitial accentuation. Continued silhouetting of the left hemidiaphragm.  Considerable enlargement of the cardiopericardial silhouette. AICD in place. Thoracic spondylosis.  IMPRESSION: 1. Mild improvement in pulmonary edema.   Electronically Signed   By: Sherryl Barters M.D.   On: 06/06/2014 08:52   Dg Chest Port 1 View  06/04/2014   CLINICAL DATA:   Sudden onset shortness of breath  EXAM: PORTABLE CHEST - 1 VIEW  COMPARISON:  06/02/2014  FINDINGS: Worsening perihilar airspace opacities suggest progressive edema. Small pleural effusions partly visualized. Cardiomegaly reidentified. Left AICD in place.  IMPRESSION: Progressive probable pulmonary edema.   Electronically Signed   By: Conchita Paris M.D.   On: 06/04/2014 02:52   Dg Chest Port 1 View  06/02/2014   CLINICAL DATA:  Congestive heart failure.  EXAM: PORTABLE CHEST - 1 VIEW  COMPARISON:  02/08/2011.  10/06/2010.  FINDINGS: Cardiac pacer in stable position. Severe stable cardiomegaly. Pulmonary venous congestion interstitial prominence suggest a component congestive heart failure. Underlying pneumonitis and/or chronic interstitial lung disease cannot be excluded. No acute bony abnormality.  IMPRESSION: 1. Severe cardiomegaly. Pulmonary venous congestion and interstitial prominence suggesting congestive heart failure. A component of pneumonitis and/or chronic interstitial lung disease cannot be excluded. 2. Cardiac pacer in stable position.   Electronically Signed   By: Marcello Moores  Register   On: 06/02/2014 07:17    PHYSICAL EXAM   WD, chronically ill appearing in NAD HEENT: normal Neck: supple Chest: mild basilar crackles CV: RRR Abd: mildly distended; not tender Ext: trace edema Neuro:  chonsistent with Parkinsons   TELEMETRY: Sinus  ASSESSMENT AND PLAN:    Acute systolic congestive heart failure    Patient presented with acute congestive heart failure in the setting of atrial fibrillation. His troponin was elevated. He underwent cardioversion and there has been improvement in his symptoms. However per Dr. Ron Parker he had an episode of worsening congestive heart failure following cardioversion and Dr. Ron Parker feels this may have been ischemia mediated (troponin increased to 8.74). He therefore feels that cardiac catheterization should be performed this admission. Coumadin is on hold. He will  need to be bridged with heparin given recent cardioversion. Continue present dose of Lasix and follow renal function.    LBBB (left bundle branch block)    Cardiomyopathy-EF 25-30%       Add low dose ACE-I and beta blocker    MDT BiV ICD 2012    Atrial fibrillation with RVR- DCCV 06/03/14    This was a new diagnosis this admission. He was cardioverted and is holding sinus rhythm. Continue amiodarone and decreased to 200 mg daily at discharge. Coumadin is on hold for cardiac catheterization. He will need a heparin bridge given recent cardioversion.        NSVT (nonsustained ventricular tachycardia)    Chronic renal insufficiency, stage III (moderate)     Renal function improving. Continue present dose of Lasix and follow. I will plan to hold Lasix 24 hours prior to his catheterization.    CAD-single vessel (RCA) in 2012/NSTEMI      Continue aspirin 81 mg daily. Change Zocor to Lipitor. Plan cardiac catheterization once INR less than 1.6.   Kirk Ruths 06/07/2014 10:50 AM

## 2014-06-07 NOTE — Progress Notes (Signed)
Patient is refusing to wear BIPAP at this time. He is currently on [facility] and sats are 98%. Patient is in no distress. Patient informes to contact RT if changes his mind.

## 2014-06-08 LAB — BASIC METABOLIC PANEL
Anion gap: 8 (ref 5–15)
BUN: 23 mg/dL (ref 6–23)
CHLORIDE: 101 meq/L (ref 96–112)
CO2: 30 mmol/L (ref 19–32)
CREATININE: 1.02 mg/dL (ref 0.50–1.35)
Calcium: 8.4 mg/dL (ref 8.4–10.5)
GFR calc Af Amer: 79 mL/min — ABNORMAL LOW (ref >90)
GFR calc non Af Amer: 68 mL/min — ABNORMAL LOW (ref >90)
GLUCOSE: 103 mg/dL — AB (ref 70–99)
Potassium: 4.2 mmol/L (ref 3.5–5.1)
Sodium: 139 mmol/L (ref 135–145)

## 2014-06-08 LAB — CBC
HEMATOCRIT: 33.7 % — AB (ref 39.0–52.0)
Hemoglobin: 10.6 g/dL — ABNORMAL LOW (ref 13.0–17.0)
MCH: 26.8 pg (ref 26.0–34.0)
MCHC: 31.5 g/dL (ref 30.0–36.0)
MCV: 85.3 fL (ref 78.0–100.0)
Platelets: 191 10*3/uL (ref 150–400)
RBC: 3.95 MIL/uL — AB (ref 4.22–5.81)
RDW: 13.3 % (ref 11.5–15.5)
WBC: 6 10*3/uL (ref 4.0–10.5)

## 2014-06-08 LAB — PROTIME-INR
INR: 2.34 — ABNORMAL HIGH (ref 0.00–1.49)
Prothrombin Time: 25.8 seconds — ABNORMAL HIGH (ref 11.6–15.2)

## 2014-06-08 NOTE — Progress Notes (Signed)
Patient ID: Thomas [name], male   DOB: 19-Apr-1934, 80 y.o.   MRN: [id number]    SUBJECTIVE:  Patient denies CP or dyspnea   Filed Vitals:   06/08/14 0330 06/08/14 0413 06/08/14 0500 06/08/14 0730  BP: 105/54  113/57 98/43  Pulse: 60  59 60  Temp: 98 F (36.7 C)   98.4 F (36.9 C)  TempSrc: Oral   Oral  Resp: 15  18 14   Height:      Weight:  206 lb 9.1 oz (93.7 kg)    SpO2: 94%  98% 92%     Intake/Output Summary (Last 24 hours) at 06/08/14 1136 Last data filed at 06/08/14 1023  Gross per 24 hour  Intake   1180 ml  Output   3001 ml  Net  -1821 ml    LABS: Basic Metabolic Panel:  Recent Labs  06/07/14 0342 06/08/14 0305  NA 140 139  K 3.9 4.2  CL 101 101  CO2 31 30  GLUCOSE 176* 103*  BUN 28* 23  CREATININE 1.32 1.02  CALCIUM 8.4 8.4   CBC:  Recent Labs  06/07/14 0342 06/08/14 0305  WBC 5.7 6.0  HGB 10.6* 10.6*  HCT 33.6* 33.7*  MCV 85.9 85.3  PLT 184 191    RADIOLOGY: Dg Chest Port 1 View  06/06/2014   CLINICAL DATA:  Hypertension. Coronary artery disease. Congestive heart failure.  EXAM: PORTABLE CHEST - 1 VIEW  COMPARISON:  06/04/2014  FINDINGS: Improved but not resolved bilateral perihilar airspace opacities. The patient is rotated to the Right on today's radiograph, reducing diagnostic sensitivity and specificity. Biapical pleural parenchymal scarring. Bilateral interstitial accentuation. Continued silhouetting of the left hemidiaphragm.  Considerable enlargement of the cardiopericardial silhouette. AICD in place. Thoracic spondylosis.  IMPRESSION: 1. Mild improvement in pulmonary edema.   Electronically Signed   By: Sherryl Barters M.D.   On: 06/06/2014 08:52   Dg Chest Port 1 View  06/04/2014   CLINICAL DATA:  Sudden onset shortness of breath  EXAM: PORTABLE CHEST - 1 VIEW  COMPARISON:  06/02/2014  FINDINGS: Worsening perihilar airspace opacities suggest progressive edema. Small pleural effusions partly visualized. Cardiomegaly  reidentified. Left AICD in place.  IMPRESSION: Progressive probable pulmonary edema.   Electronically Signed   By: Conchita Paris M.D.   On: 06/04/2014 02:52   Dg Chest Port 1 View  06/02/2014   CLINICAL DATA:  Congestive heart failure.  EXAM: PORTABLE CHEST - 1 VIEW  COMPARISON:  02/08/2011.  10/06/2010.  FINDINGS: Cardiac pacer in stable position. Severe stable cardiomegaly. Pulmonary venous congestion interstitial prominence suggest a component congestive heart failure. Underlying pneumonitis and/or chronic interstitial lung disease cannot be excluded. No acute bony abnormality.  IMPRESSION: 1. Severe cardiomegaly. Pulmonary venous congestion and interstitial prominence suggesting congestive heart failure. A component of pneumonitis and/or chronic interstitial lung disease cannot be excluded. 2. Cardiac pacer in stable position.   Electronically Signed   By: Marcello Moores  Register   On: 06/02/2014 07:17    PHYSICAL EXAM   WD, chronically ill appearing in NAD HEENT: normal Neck: supple Chest: mild basilar crackles CV: RRR Abd: mildly distended; not tender Ext: trace edema Neuro: chonsistent with Parkinsons   TELEMETRY: Sinus  ASSESSMENT AND PLAN:    Acute systolic congestive heart failure    Patient presented with acute congestive heart failure in the setting of atrial fibrillation. His troponin was elevated. He underwent cardioversion and there has been improvement in his symptoms. However per Dr. Ron Parker he had an  episode of worsening congestive heart failure following cardioversion and Dr. Ron Parker feels this may have been ischemia mediated (troponin initially increased to 8.74). He therefore feels that cardiac catheterization should be performed this admission. Coumadin is on hold. He will need to be bridged with heparin given recent cardioversion (most likely begin in AM if INR < 2). Continue present dose of Lasix and follow renal function.    LBBB (left bundle branch block)     Cardiomyopathy-EF 25-30%       Continue low dose ACE-I and beta blocker    MDT BiV ICD 2012    Atrial fibrillation with RVR- DCCV 06/03/14    This was a new diagnosis this admission. He was cardioverted and is holding sinus rhythm. Continue amiodarone and decrease to 200 mg daily at discharge. Coumadin is on hold for cardiac catheterization. He will need a heparin bridge given recent cardioversion.        NSVT (nonsustained ventricular tachycardia)    Chronic renal insufficiency, stage III (moderate)     Renal function improved. Continue present dose of Lasix and follow. I will plan to hold Lasix 24 hours prior to his catheterization (cath most likely Tuesday).    CAD-single vessel (RCA) in 2012/NSTEMI      Continue aspirin 81 mg daily. Continue Lipitor. Plan cardiac catheterization once INR less than 1.6 (probably Tuesday).   Kirk Ruths 06/08/2014 11:36 AM

## 2014-06-08 NOTE — Progress Notes (Signed)
Patient is currently on RA at this time and sats are 96%. No BIPAP is needed at this time.

## 2014-06-09 LAB — BASIC METABOLIC PANEL
ANION GAP: 5 (ref 5–15)
BUN: 20 mg/dL (ref 6–23)
CHLORIDE: 100 meq/L (ref 96–112)
CO2: 31 mmol/L (ref 19–32)
Calcium: 8.3 mg/dL — ABNORMAL LOW (ref 8.4–10.5)
Creatinine, Ser: 1.12 mg/dL (ref 0.50–1.35)
GFR calc Af Amer: 71 mL/min — ABNORMAL LOW (ref >90)
GFR calc non Af Amer: 61 mL/min — ABNORMAL LOW (ref >90)
Glucose, Bld: 149 mg/dL — ABNORMAL HIGH (ref 70–99)
Potassium: 4.1 mmol/L (ref 3.5–5.1)
Sodium: 136 mmol/L (ref 135–145)

## 2014-06-09 LAB — CBC
HEMATOCRIT: 33.7 % — AB (ref 39.0–52.0)
HEMOGLOBIN: 10.5 g/dL — AB (ref 13.0–17.0)
MCH: 26.8 pg (ref 26.0–34.0)
MCHC: 31.2 g/dL (ref 30.0–36.0)
MCV: 86 fL (ref 78.0–100.0)
Platelets: 209 10*3/uL (ref 150–400)
RBC: 3.92 MIL/uL — AB (ref 4.22–5.81)
RDW: 13.4 % (ref 11.5–15.5)
WBC: 6.2 10*3/uL (ref 4.0–10.5)

## 2014-06-09 LAB — PROTIME-INR
INR: 1.8 — ABNORMAL HIGH (ref 0.00–1.49)
Prothrombin Time: 21 seconds — ABNORMAL HIGH (ref 11.6–15.2)

## 2014-06-09 LAB — HEPARIN LEVEL (UNFRACTIONATED): Heparin Unfractionated: <0.1 IU/mL — ABNORMAL LOW (ref 0.30–0.70)

## 2014-06-09 MED ORDER — SODIUM CHLORIDE 0.9 % IJ SOLN
3.0000 mL | Freq: Two times a day (BID) | INTRAMUSCULAR | Status: DC
  Administered 2014-06-10: 3 mL via INTRAVENOUS

## 2014-06-09 MED ORDER — SODIUM CHLORIDE 0.9 % IJ SOLN: 3.0000 mL | INTRAMUSCULAR | Status: DC | PRN

## 2014-06-09 MED ORDER — ASPIRIN EC 81 MG PO TBEC
81.0000 mg | DELAYED_RELEASE_TABLET | Freq: Every day | ORAL | Status: DC
  Administered 2014-06-11 – 2014-06-15 (×4): 81 mg via ORAL
  Filled 2014-06-09 (×5): qty 1

## 2014-06-09 MED ORDER — ASPIRIN 81 MG PO CHEW
81.0000 mg | CHEWABLE_TABLET | ORAL | Status: AC
  Administered 2014-06-10: 81 mg via ORAL
  Filled 2014-06-09: qty 1

## 2014-06-09 MED ORDER — SODIUM CHLORIDE 0.9 % IV SOLN: 250.0000 mL | INTRAVENOUS | Status: DC | PRN

## 2014-06-09 MED ORDER — HEPARIN (PORCINE) IN NACL 100-0.45 UNIT/ML-% IJ SOLN
1800.0000 [IU]/h | INTRAMUSCULAR | Status: DC
  Administered 2014-06-09: 1300 [IU]/h via INTRAVENOUS
  Administered 2014-06-10: 1800 [IU]/h via INTRAVENOUS
  Filled 2014-06-09 (×4): qty 250

## 2014-06-09 NOTE — Progress Notes (Signed)
ANTICOAGULATION CONSULT NOTE - Follow up [place] for Heparin Indication: ACS/afib  No Known Allergies  Patient Measurements: Height: 6' (182.9 cm) Weight: 205 lb 11 oz (93.3 kg) IBW/kg (Calculated) : 77.6 Heparin Dosing Weight: 93 kg  Vital Signs: Temp: 97.9 F (36.6 C) (12/28 1749) Temp Source: Oral (12/28 1749) BP: 117/45 mmHg (12/28 1749) Pulse Rate: 59 (12/28 1749)  Labs:  Recent Labs  06/07/14 0342 06/08/14 0305 06/09/14 0516 06/09/14 1647  HGB 10.6* 10.6* 10.5*  --   HCT 33.6* 33.7* 33.7*  --   PLT 184 191 209  --   LABPROT 32.6* 25.8* 21.0*  --   INR 3.15* 2.34* 1.80*  --   HEPARINUNFRC  --   --   --  <0.10*  CREATININE 1.32 1.02 1.12  --     Estimated Creatinine Clearance: 64.5 mL/min (by C-G formula based on Cr of 1.12).   Medical History: Past Medical History  Diagnosis Date  . Coronary artery disease     80% distal RCA stenosis.  . Thyroid disease     HYPOTHYROIDISM  . Nontoxic multinodular goiter   . Psychosexual dysfunction with inhibited sexual excitement     ERECTILE DYSFUNCTION  . Hypertrophy of prostate without urinary obstruction and other lower urinary tract symptoms (LUTS)     BPH  . Hypertension   . Wide-complex tachycardia   . Parkinson's disease   . Status post biventricular cardiac pacemaker insertion     Status post CRT-D.    Medications:  Prescriptions prior to admission  Medication Sig Dispense Refill Last Dose  . amantadine (SYMMETREL) 100 MG capsule Take 100 mg by mouth 2 (two) times daily.    06/01/2014 at Unknown time  . DULoxetine (CYMBALTA) 30 MG capsule Take 30 mg by mouth daily.     06/01/2014 at Unknown time  . metoprolol tartrate (LOPRESSOR) 25 MG tablet Take 12.5 mg by mouth 2 (two) times daily.   unk at 600  . Multiple Vitamins-Minerals (ICAPS MV PO) Take 2 by mouth twice daily    06/01/2014 at Unknown time  . Omega-3 Fatty Acids (FISH OIL) 1000 MG CAPS Take one by mouth twice daily      06/01/2014 at Unknown time  . rotigotine (NEUPRO) 6 MG/24HR Place 1 patch onto the skin daily.   06/01/2014 at Unknown time  . simvastatin (ZOCOR) 20 MG tablet Take 1 tablet (20 mg total) by mouth at bedtime. 30 tablet 0 06/01/2014 at Unknown time  . torsemide (DEMADEX) 100 MG tablet Take 50-100 mg by mouth daily as needed (edema).    06/01/2014 at Unknown time  . vitamin C (ASCORBIC ACID) 500 MG tablet Take 500 mg by mouth.   06/01/2014 at Unknown time  . warfarin (COUMADIN) 3 MG tablet Take 6 mg by mouth daily.   06/01/2014 at Unknown time  . nitroGLYCERIN (NITROSTAT) 0.4 MG SL tablet Place 0.4 mg under the tongue every 5 (five) minutes as needed.     not used  . [DISCONTINUED] carvedilol (COREG) 6.25 MG tablet Take 0.5 tablets (3.125 mg total) by mouth 2 (two) times daily. (Patient not taking: Reported on 06/02/2014)     . [DISCONTINUED] furosemide (LASIX) 40 MG tablet Take 1 tablet (40 mg total) by mouth daily. (Patient not taking: Reported on 06/02/2014)     . [DISCONTINUED] lisinopril (PRINIVIL,ZESTRIL) 5 MG tablet Take 0.5 tablets (2.5 mg total) by mouth daily. (Patient not taking: Reported on 06/02/2014)       Assessment:  80 y.o. male presents with SOB, afib, NSTEMI. Pt on coumadin PTA for afib - s/p successful DCCV 12/22. Coumadin held since 12/24 for cath (most likely Tuesday). heparin bridge as INR 1.8 today. Heparin level reported < goal, no bleeding noted, no problems with infusion. CBC stable.   Goal of Therapy:  Heparin level 0.3-0.7 units/ml Monitor platelets by anticoagulation protocol: Yes   Plan:  1. Increase Heparin gtt to 1500 units/hr. No bolus. 2. Will check 6 hour heparin level 3. Daily heparin level and CBC  Thank you for allowing pharmacy to be a part of this patients care team.  Rowe Robert Pharm.D., BCPS, AQ-Cardiology Clinical Pharmacist 06/09/2014 6:28 PM Pager: [phone] Phone: [phone]

## 2014-06-09 NOTE — Progress Notes (Addendum)
Physical Therapy Treatment Patient Details Name: Thomas [name] MRN: [id number] DOB: [birth year] Today's Date: 06/09/2014    History of Present Illness 80 y.o. male with a history of CAD and mostly non-ischemic cardiomypathy, s/p CRT-D, parkinsons disease, who presents with 1 day of SOB.  He presented to [facility] ED, and was found to be in AFib with RVR and with an elevated troponin of 5.      PT Comments    Patient progressing with mobility but still requires assist for ambulation at this time. Spoke with patient regarding concerns for mobility, patient states that he does not wish for inpatient therapies despite recommendation. Patient states that he will be going to his daughters home upon discharge.  Patient will need HHPT for mobility progression despite recommendation for CIR.  Will update follow up needs as indicated. Will continue to progress mobility as tolerated. Recommend continued ambulation with staff as able.  Follow Up Recommendations  Home health PT;Supervision/Assistance - 24 hour (if going home to daughters)     Equipment Recommendations  None recommended by PT    Recommendations for Other Services       Precautions / Restrictions Precautions Precautions: Fall Restrictions Weight Bearing Restrictions: No    Mobility  Bed Mobility Overal bed mobility: Needs Assistance Bed Mobility: Rolling;Sidelying to Sit Rolling: Min assist Sidelying to sit: Mod assist       General bed mobility comments: VCs for sequencing and technique, assist for elevation to upright  Transfers Overall transfer level: Needs assistance Equipment used: Rolling walker (2 wheeled) Transfers: Sit to/from Stand Sit to Stand: Min assist Stand pivot transfers: Min assist (to [facility] from chair and back )       General transfer comment: VCs for hand placement, poor control of descent x3 when returning to chair  Ambulation/Gait Ambulation/Gait assistance: Min assist Ambulation  Distance (Feet): 280 Feet Assistive device: Rolling walker (2 wheeled) Gait Pattern/deviations: Step-through pattern;Decreased stride length;Festinating;Drifts right/left;Trunk flexed;Leaning posteriorly Gait velocity: Decreased Gait velocity interpretation: Below normal speed for age/gender General Gait Details: Patient with improved activity tolerance and heavy reliance on RW during ambulation, continuously veering to the left. Patient with instability noted and occassional assist for control of RW.    Stairs            Wheelchair Mobility    Modified Rankin (Stroke Patients Only)       Balance                                    Cognition Arousal/Alertness: Awake/alert Behavior During Therapy: WFL for tasks assessed/performed Overall Cognitive Status: Within Functional Limits for tasks assessed                      Exercises General Exercises - Lower Extremity Ankle Circles/Pumps: AROM;Both;10 reps Long Arc Quad: AROM;Both;10 reps Straight Leg Raises: AROM;Both;10 reps    General Comments General comments (skin integrity, edema, etc.): VSS throughout session, assisted with pericare s/p BM      Pertinent Vitals/Pain      Home Living                      Prior Function            PT Goals (current goals can now be found in the care plan section) Acute Rehab PT Goals Patient Stated Goal: to get with walk and go home PT  Goal Formulation: With patient/family Time For Goal Achievement: 06/18/14 Potential to Achieve Goals: Good Progress towards PT goals: Progressing toward goals    Frequency  Min 3X/week    PT Plan Current plan remains appropriate    Co-evaluation             End of Session Equipment Utilized During Treatment: Gait belt Activity Tolerance:Patient tolerated treatment well  Patient left: in chair;with call bell/phone within reach     Time: 1414-1439 PT Time Calculation (min) (ACUTE ONLY): 25  min  Charges:  $Gait Training: 8-22 mins $Therapeutic Activity: 8-22 mins                    G CodesDuncan [name] July 08, 2014, 4:25 PM Thomas [name], [place] DPT  [phone]

## 2014-06-09 NOTE — Progress Notes (Signed)
Patient ID: Thomas [name], male   DOB: 1934-05-02, 80 y.o.   MRN: [id number]    SUBJECTIVE:  Patient denies CP or dyspnea, awaiting cath. Trop 8. Recent cardioversion.   Filed Vitals:   06/08/14 2018 06/08/14 2336 06/09/14 0054 06/09/14 0344  BP: 111/55 109/57  117/63  Pulse: 60 60  59  Temp: 98 F (36.7 C) 97.4 F (36.3 C)  98 F (36.7 C)  TempSrc: Oral Oral  Oral  Resp: 22 20  16   Height:   6' (1.829 m)   Weight:   205 lb 11 oz (93.3 kg)   SpO2: 94% 96%  98%     Intake/Output Summary (Last 24 hours) at 06/09/14 0801 Last data filed at 06/09/14 0359  Gross per 24 hour  Intake  72.92 ml  Output   2575 ml  Net -2502.08 ml    LABS: Basic Metabolic Panel:  Recent Labs  06/08/14 0305 06/09/14 0516  NA 139 136  K 4.2 4.1  CL 101 100  CO2 30 31  GLUCOSE 103* 149*  BUN 23 20  CREATININE 1.02 1.12  CALCIUM 8.4 8.3*   CBC:  Recent Labs  06/08/14 0305 06/09/14 0516  WBC 6.0 6.2  HGB 10.6* 10.5*  HCT 33.7* 33.7*  MCV 85.3 86.0  PLT 191 209    RADIOLOGY: Dg Chest Port 1 View  06/06/2014   CLINICAL DATA:  Hypertension. Coronary artery disease. Congestive heart failure.  EXAM: PORTABLE CHEST - 1 VIEW  COMPARISON:  06/04/2014  FINDINGS: Improved but not resolved bilateral perihilar airspace opacities. The patient is rotated to the Right on today's radiograph, reducing diagnostic sensitivity and specificity. Biapical pleural parenchymal scarring. Bilateral interstitial accentuation. Continued silhouetting of the left hemidiaphragm.  Considerable enlargement of the cardiopericardial silhouette. AICD in place. Thoracic spondylosis.  IMPRESSION: 1. Mild improvement in pulmonary edema.   Electronically Signed   By: Sherryl Barters M.D.   On: 06/06/2014 08:52   Dg Chest Port 1 View  06/04/2014   CLINICAL DATA:  Sudden onset shortness of breath  EXAM: PORTABLE CHEST - 1 VIEW  COMPARISON:  06/02/2014  FINDINGS: Worsening perihilar airspace opacities suggest  progressive edema. Small pleural effusions partly visualized. Cardiomegaly reidentified. Left AICD in place.  IMPRESSION: Progressive probable pulmonary edema.   Electronically Signed   By: Conchita Paris M.D.   On: 06/04/2014 02:52   Dg Chest Port 1 View  06/02/2014   CLINICAL DATA:  Congestive heart failure.  EXAM: PORTABLE CHEST - 1 VIEW  COMPARISON:  02/08/2011.  10/06/2010.  FINDINGS: Cardiac pacer in stable position. Severe stable cardiomegaly. Pulmonary venous congestion interstitial prominence suggest a component congestive heart failure. Underlying pneumonitis and/or chronic interstitial lung disease cannot be excluded. No acute bony abnormality.  IMPRESSION: 1. Severe cardiomegaly. Pulmonary venous congestion and interstitial prominence suggesting congestive heart failure. A component of pneumonitis and/or chronic interstitial lung disease cannot be excluded. 2. Cardiac pacer in stable position.   Electronically Signed   By: Marcello Moores  Register   On: 06/02/2014 07:17    PHYSICAL EXAM   WD, chronically ill appearing in NAD HEENT: normal Neck: supple Chest: mild basilar crackles CV: RRR Abd: mildly distended; not tender Ext: trace edema Neuro:  Parkinsons   TELEMETRY: Sinus  ASSESSMENT AND PLAN:    Acute systolic congestive heart failure    80 year old with acute congestive heart failure in the setting of atrial fibrillation. His troponin was elevated. He underwent cardioversion and there has been improvement in  his symptoms. However per Dr. Ron [name] he had an episode of worsening congestive heart failure following cardioversion and Dr. Ron [name] feels this may have been ischemia mediated (troponin initially increased to 8.74). He therefore feels that cardiac catheterization should be performed this admission. Coumadin is on hold. Starting bridged with heparin given recent cardioversion. Continue present dose of Lasix and follow renal function.    LBBB (left bundle branch block)     Cardiomyopathy-EF 25-30%       Continue low dose ACE-I and beta blocker    MDT BiV ICD 2012    Atrial fibrillation with RVR- DCCV 06/03/14    This was a new diagnosis this admission. He was cardioverted and is holding sinus rhythm. Continue amiodarone and decrease to 200 mg daily at discharge. Coumadin is on hold for cardiac catheterization. He will need a heparin bridge given recent cardioversion.        NSVT (nonsustained ventricular tachycardia)    Chronic renal insufficiency, stage III (moderate)     Renal function improved. Continue present dose of Lasix and follow. I will plan to hold Lasix 24 hours prior to his catheterization (cath most likely Tuesday).    CAD-single vessel (RCA) in 2012/NSTEMI      Continue aspirin 81 mg daily. Continue Lipitor. Plan cardiac catheterization once INR less than 1.6 (probably Tuesday). This will also give time for creat to continue to improve.    Ronin Crager, [place] 06/09/2014 8:01 AM

## 2014-06-09 NOTE — Progress Notes (Signed)
ANTICOAGULATION CONSULT NOTE - Initial Consult  Pharmacy Consult for Heparin Indication: ACS/afib  No Known Allergies  Patient Measurements: Height: 6' (182.9 cm) Weight: 205 lb 11 oz (93.3 kg) IBW/kg (Calculated) : 77.6 Heparin Dosing Weight: 93 kg  Vital Signs: Temp: 98 F (36.7 C) (12/28 0344) Temp Source: Oral (12/28 0344) BP: 117/63 mmHg (12/28 0344) Pulse Rate: 59 (12/28 0344)  Labs:  Recent Labs  06/07/14 0342 06/08/14 0305 06/09/14 0516  HGB 10.6* 10.6* 10.5*  HCT 33.6* 33.7* 33.7*  PLT 184 191 209  LABPROT 32.6* 25.8* 21.0*  INR 3.15* 2.34* 1.80*  CREATININE 1.32 1.02 1.12    Estimated Creatinine Clearance: 64.5 mL/min (by C-G formula based on Cr of 1.12).   Medical History: Past Medical History  Diagnosis Date  . Coronary artery disease     80% distal RCA stenosis.  . Thyroid disease     HYPOTHYROIDISM  . Nontoxic multinodular goiter   . Psychosexual dysfunction with inhibited sexual excitement     ERECTILE DYSFUNCTION  . Hypertrophy of prostate without urinary obstruction and other lower urinary tract symptoms (LUTS)     BPH  . Hypertension   . Wide-complex tachycardia   . Parkinson's disease   . Status post biventricular cardiac pacemaker insertion     Status post CRT-D.    Medications:  Prescriptions prior to admission  Medication Sig Dispense Refill Last Dose  . amantadine (SYMMETREL) 100 MG capsule Take 100 mg by mouth 2 (two) times daily.    06/01/2014 at Unknown time  . DULoxetine (CYMBALTA) 30 MG capsule Take 30 mg by mouth daily.     06/01/2014 at Unknown time  . metoprolol tartrate (LOPRESSOR) 25 MG tablet Take 12.5 mg by mouth 2 (two) times daily.   unk at 600  . Multiple Vitamins-Minerals (ICAPS MV PO) Take 2 by mouth twice daily    06/01/2014 at Unknown time  . Omega-3 Fatty Acids (FISH OIL) 1000 MG CAPS Take one by mouth twice daily     06/01/2014 at Unknown time  . rotigotine (NEUPRO) 6 MG/24HR Place 1 patch onto the skin  daily.   06/01/2014 at Unknown time  . simvastatin (ZOCOR) 20 MG tablet Take 1 tablet (20 mg total) by mouth at bedtime. 30 tablet 0 06/01/2014 at Unknown time  . torsemide (DEMADEX) 100 MG tablet Take 50-100 mg by mouth daily as needed (edema).    06/01/2014 at Unknown time  . vitamin C (ASCORBIC ACID) 500 MG tablet Take 500 mg by mouth.   06/01/2014 at Unknown time  . warfarin (COUMADIN) 3 MG tablet Take 6 mg by mouth daily.   06/01/2014 at Unknown time  . nitroGLYCERIN (NITROSTAT) 0.4 MG SL tablet Place 0.4 mg under the tongue every 5 (five) minutes as needed.     not used  . [DISCONTINUED] carvedilol (COREG) 6.25 MG tablet Take 0.5 tablets (3.125 mg total) by mouth 2 (two) times daily. (Patient not taking: Reported on 06/02/2014)     . [DISCONTINUED] furosemide (LASIX) 40 MG tablet Take 1 tablet (40 mg total) by mouth daily. (Patient not taking: Reported on 06/02/2014)     . [DISCONTINUED] lisinopril (PRINIVIL,ZESTRIL) 5 MG tablet Take 0.5 tablets (2.5 mg total) by mouth daily. (Patient not taking: Reported on 06/02/2014)       Assessment: 80 y.o. male presents with SOB, afib, NSTEMI. Pt on coumadin PTA for afib - s/p successful DCCV 12/22. Coumadin held since 12/24 for cath (most likely Tuesday). Pt to begin heparin bridge  as INR 1.8 today. CBC stable.   Goal of Therapy:  Heparin level 0.3-0.7 units/ml Monitor platelets by anticoagulation protocol: Yes   Plan:  1. Heparin gtt at 1300 units/hr. No bolus. 2. Will check 8 hour heparin level 3. Daily heparin level and CBC  Sherlon Handing, PharmD, BCPS Clinical pharmacist, pager [phone] 06/09/2014,8:08 AM

## 2014-06-10 ENCOUNTER — Encounter (HOSPITAL_COMMUNITY)
Admission: AD | Disposition: A | Discharge status: Home Health | Payer: Self-pay | Source: Other Acute Inpatient Hospital | Attending: Cardiology

## 2014-06-10 ENCOUNTER — Encounter (HOSPITAL_COMMUNITY): Payer: Self-pay | Admitting: Interventional Cardiology

## 2014-06-10 DIAGNOSIS — I255 Ischemic cardiomyopathy: Secondary | ICD-10-CM

## 2014-06-10 DIAGNOSIS — I2510 Atherosclerotic heart disease of native coronary artery without angina pectoris: Secondary | ICD-10-CM

## 2014-06-10 HISTORY — PX: ABDOMINAL ANGIOGRAM: SHX5499

## 2014-06-10 HISTORY — PX: LEFT HEART CATHETERIZATION WITH CORONARY ANGIOGRAM: SHX5451

## 2014-06-10 LAB — CBC
HEMATOCRIT: 35.1 % — AB (ref 39.0–52.0)
Hemoglobin: 11 g/dL — ABNORMAL LOW (ref 13.0–17.0)
MCH: 26.9 pg (ref 26.0–34.0)
MCHC: 31.3 g/dL (ref 30.0–36.0)
MCV: 85.8 fL (ref 78.0–100.0)
Platelets: 211 10*3/uL (ref 150–400)
RBC: 4.09 MIL/uL — ABNORMAL LOW (ref 4.22–5.81)
RDW: 13.4 % (ref 11.5–15.5)
WBC: 5.6 10*3/uL (ref 4.0–10.5)

## 2014-06-10 LAB — BASIC METABOLIC PANEL
Anion gap: 7 (ref 5–15)
BUN: 22 mg/dL (ref 6–23)
CO2: 29 mmol/L (ref 19–32)
Calcium: 8.4 mg/dL (ref 8.4–10.5)
Chloride: 103 mEq/L (ref 96–112)
Creatinine, Ser: 1.04 mg/dL (ref 0.50–1.35)
GFR calc Af Amer: 77 mL/min — ABNORMAL LOW (ref >90)
GFR calc non Af Amer: 67 mL/min — ABNORMAL LOW (ref >90)
Glucose, Bld: 92 mg/dL (ref 70–99)
Potassium: 4.6 mmol/L (ref 3.5–5.1)
Sodium: 139 mmol/L (ref 135–145)

## 2014-06-10 LAB — PROTIME-INR
INR: 1.53 — ABNORMAL HIGH (ref 0.00–1.49)
Prothrombin Time: 18.5 seconds — ABNORMAL HIGH (ref 11.6–15.2)

## 2014-06-10 LAB — HEPARIN LEVEL (UNFRACTIONATED): Heparin Unfractionated: <0.1 IU/mL — ABNORMAL LOW (ref 0.30–0.70)

## 2014-06-10 SURGERY — LEFT HEART CATHETERIZATION WITH CORONARY ANGIOGRAM
Anesthesia: LOCAL

## 2014-06-10 MED ORDER — LIDOCAINE HCL (PF) 1 % IJ SOLN
INTRAMUSCULAR | Status: AC
  Filled 2014-06-10: qty 30

## 2014-06-10 MED ORDER — HEPARIN (PORCINE) IN NACL 2-0.9 UNIT/ML-% IJ SOLN
INTRAMUSCULAR | Status: AC
  Filled 2014-06-10: qty 1000

## 2014-06-10 MED ORDER — HEPARIN SODIUM (PORCINE) 1000 UNIT/ML IJ SOLN
INTRAMUSCULAR | Status: AC
  Filled 2014-06-10: qty 1

## 2014-06-10 MED ORDER — FENTANYL CITRATE 0.05 MG/ML IJ SOLN
INTRAMUSCULAR | Status: AC
  Filled 2014-06-10: qty 2

## 2014-06-10 MED ORDER — VERAPAMIL HCL 2.5 MG/ML IV SOLN
INTRAVENOUS | Status: AC
  Filled 2014-06-10: qty 2

## 2014-06-10 MED ORDER — SODIUM CHLORIDE 0.9 % IV SOLN
INTRAVENOUS | Status: AC
  Administered 2014-06-10: 17:00:00 via INTRAVENOUS

## 2014-06-10 MED ORDER — OXYCODONE-ACETAMINOPHEN 5-325 MG PO TABS
1.0000 | ORAL_TABLET | ORAL | Status: DC | PRN
  Administered 2014-06-10: 23:00:00 1 via ORAL
  Administered 2014-06-11 – 2014-06-12 (×3): 2 via ORAL
  Filled 2014-06-10 (×2): qty 2
  Filled 2014-06-10 (×3): qty 1

## 2014-06-10 MED ORDER — HEPARIN (PORCINE) IN NACL 100-0.45 UNIT/ML-% IJ SOLN
1800.0000 [IU]/h | INTRAMUSCULAR | Status: DC
  Administered 2014-06-10 – 2014-06-11 (×2): 1800 [IU]/h via INTRAVENOUS
  Filled 2014-06-10 (×4): qty 250

## 2014-06-10 MED ORDER — MIDAZOLAM HCL 2 MG/2ML IJ SOLN
INTRAMUSCULAR | Status: AC
  Filled 2014-06-10: qty 2

## 2014-06-10 NOTE — Progress Notes (Addendum)
ANTICOAGULATION CONSULT NOTE Pharmacy Consult for Heparin Indication: ACS/afib  No Known Allergies  Patient Measurements: Height: 6' (182.9 cm) Weight: 207 lb 3.7 oz (94 kg) IBW/kg (Calculated) : 77.6 Heparin Dosing Weight: 93 kg  Vital Signs: Temp: 98 F (36.7 C) (12/29 1236) Temp Source: Oral (12/29 1613) BP: 110/67 mmHg (12/29 1613) Pulse Rate: 60 (12/29 1613)  Labs:  Recent Labs  06/08/14 0305 06/09/14 0516 06/09/14 1647 06/10/14 06/10/14 0720  HGB 10.6* 10.5*  --   --  11.0*  HCT 33.7* 33.7*  --   --  35.1*  PLT 191 209  --   --  211  LABPROT 25.8* 21.0*  --   --  18.5*  INR 2.34* 1.80*  --   --  1.53*  HEPARINUNFRC  --   --  <0.10* <0.10*  --   CREATININE 1.02 1.12  --   --  1.04    Estimated Creatinine Clearance: 69.7 mL/min (by C-G formula based on Cr of 1.04).  Assessment: 80 y.o. male presents NSTEMI, h/o Afib, Coumadin on hold. Pt got cath today and showed severe 3VCAD. May need CABG. Heparin has been reordered to be started 8hr post sheath.    Goal of Therapy:  Heparin level 0.3-0.7 units/ml Monitor platelets by anticoagulation protocol: Yes   Plan:   Restart Heparin 1800 units/hr at 2300 Follow-up am labs.   Onnie Boer, PharmD Pager: [phone] 06/10/2014 4:44 PM

## 2014-06-10 NOTE — H&P (View-Only) (Signed)
Patient ID: Thomas [name], male   DOB: 01/29/1934, 80 y.o.   MRN: [id number]    SUBJECTIVE:  Patient denies CP or dyspnea, awaiting cath. Trop 8. Recent cardioversion. Labs about to be drawn.    Filed Vitals:   06/09/14 1749 06/09/14 2033 06/09/14 2335 06/10/14 0415  BP: 117/45 99/48 100/49 115/63  Pulse: 59 60 60 60  Temp: 97.9 F (36.6 C) 98.5 F (36.9 C) 98.7 F (37.1 C) 97.8 F (36.6 C)  TempSrc: Oral Oral Axillary Axillary  Resp: 15 15 21 13   Height:      Weight:    207 lb 3.7 oz (94 kg)  SpO2: 99% 100%  97%     Intake/Output Summary (Last 24 hours) at 06/10/14 0814 Last data filed at 06/10/14 0700  Gross per 24 hour  Intake 1713.27 ml  Output   2400 ml  Net -686.73 ml    LABS: Basic Metabolic Panel:  Recent Labs  06/08/14 0305 06/09/14 0516  NA 139 136  K 4.2 4.1  CL 101 100  CO2 30 31  GLUCOSE 103* 149*  BUN 23 20  CREATININE 1.02 1.12  CALCIUM 8.4 8.3*   CBC:  Recent Labs  06/08/14 0305 06/09/14 0516  WBC 6.0 6.2  HGB 10.6* 10.5*  HCT 33.7* 33.7*  MCV 85.3 86.0  PLT 191 209    RADIOLOGY: Dg Chest Port 1 View  06/06/2014   CLINICAL DATA:  Hypertension. Coronary artery disease. Congestive heart failure.  EXAM: PORTABLE CHEST - 1 VIEW  COMPARISON:  06/04/2014  FINDINGS: Improved but not resolved bilateral perihilar airspace opacities. The patient is rotated to the Right on today's radiograph, reducing diagnostic sensitivity and specificity. Biapical pleural parenchymal scarring. Bilateral interstitial accentuation. Continued silhouetting of the left hemidiaphragm.  Considerable enlargement of the cardiopericardial silhouette. AICD in place. Thoracic spondylosis.  IMPRESSION: 1. Mild improvement in pulmonary edema.   Electronically Signed   By: Sherryl Barters M.D.   On: 06/06/2014 08:52   Dg Chest Port 1 View  06/04/2014   CLINICAL DATA:  Sudden onset shortness of breath  EXAM: PORTABLE CHEST - 1 VIEW  COMPARISON:  06/02/2014  FINDINGS:  Worsening perihilar airspace opacities suggest progressive edema. Small pleural effusions partly visualized. Cardiomegaly reidentified. Left AICD in place.  IMPRESSION: Progressive probable pulmonary edema.   Electronically Signed   By: Conchita Paris M.D.   On: 06/04/2014 02:52   Dg Chest Port 1 View  06/02/2014   CLINICAL DATA:  Congestive heart failure.  EXAM: PORTABLE CHEST - 1 VIEW  COMPARISON:  02/08/2011.  10/06/2010.  FINDINGS: Cardiac pacer in stable position. Severe stable cardiomegaly. Pulmonary venous congestion interstitial prominence suggest a component congestive heart failure. Underlying pneumonitis and/or chronic interstitial lung disease cannot be excluded. No acute bony abnormality.  IMPRESSION: 1. Severe cardiomegaly. Pulmonary venous congestion and interstitial prominence suggesting congestive heart failure. A component of pneumonitis and/or chronic interstitial lung disease cannot be excluded. 2. Cardiac pacer in stable position.   Electronically Signed   By: Marcello Moores  Register   On: 06/02/2014 07:17    PHYSICAL EXAM   WD, chronically ill appearing in NAD HEENT: normal Neck: supple Chest: no basilar crackles, ICD CV: RRR Abd: mildly distended; not tender Ext: trace edema Neuro:  Parkinsons   TELEMETRY: Sinus  ASSESSMENT AND PLAN:    Acute systolic congestive heart failure    80 year old with acute congestive heart failure in the setting of atrial fibrillation. His troponin was elevated. He underwent  cardioversion and there has been improvement in his symptoms. However per Dr. Ron Parker he had an episode of worsening congestive heart failure following cardioversion and Dr. Ron Parker feels this may have been ischemia mediated (troponin initially increased to 8.74). He therefore feels that cardiac catheterization should be performed this admission. Coumadin is on hold. Starting bridged with heparin given recent cardioversion. Continue present dose of Lasix and follow renal  function.    LBBB (left bundle branch block)    Cardiomyopathy-EF 25-30%       Continue low dose ACE-I and beta blocker    MDT BiV ICD 2012    Atrial fibrillation with RVR- DCCV 06/03/14    This was a new diagnosis this admission. He was cardioverted and is holding sinus rhythm. Continue amiodarone and decrease to 200 mg daily at discharge. Coumadin is on hold for cardiac catheterization. Heparin bridge given recent cardioversion.        NSVT (nonsustained ventricular tachycardia)    Chronic renal insufficiency, stage III (moderate)     Renal function improved. Holding Lasix  prior to his catheterization (cath most likely Tuesday).    CAD-single vessel (RCA) in 2012/NSTEMI      Continue aspirin 81 mg daily. Continue Lipitor. Cardiac catheterization today  INR should be less than 1.6.  Silvia Markuson, [place] 06/10/2014 8:14 AM

## 2014-06-10 NOTE — Interval H&P Note (Signed)
History and Physical Interval Note:  06/10/2014 2:10 PM Cath Lab Visit (complete for each Cath Lab visit)  Clinical Evaluation Leading to the Procedure:   ACS: Yes.    Non-ACS:    Anginal Classification: CCS III  Anti-ischemic medical therapy: No Therapy  Non-Invasive Test Results: No non-invasive testing performed  Prior CABG: No previous CABG        Thomas [name]  has presented today for surgery, with the diagnosis of cp  The various methods of treatment have been discussed with the patient and family. After consideration of risks, benefits and other options for treatment, the patient has consented to  Procedure(s): LEFT HEART CATHETERIZATION WITH CORONARY ANGIOGRAM (N/A) as a surgical intervention .  The patient's history has been reviewed, patient examined, no change in status, stable for surgery.  I have reviewed the patient's chart and labs.  Questions were answered to the patient's satisfaction.     Sinclair Grooms

## 2014-06-10 NOTE — Progress Notes (Signed)
ANTICOAGULATION CONSULT NOTE Pharmacy Consult for Heparin Indication: ACS/afib  No Known Allergies  Patient Measurements: Height: 6' (182.9 cm) Weight: 205 lb 11 oz (93.3 kg) IBW/kg (Calculated) : 77.6 Heparin Dosing Weight: 93 kg  Vital Signs: Temp: 98.7 F (37.1 C) (12/28 2335) Temp Source: Axillary (12/28 2335) BP: 100/49 mmHg (12/28 2335) Pulse Rate: 60 (12/28 2335)  Labs:  Recent Labs  06/07/14 0342 06/08/14 0305 06/09/14 0516 06/09/14 1647 06/10/14  HGB 10.6* 10.6* 10.5*  --   --   HCT 33.6* 33.7* 33.7*  --   --   PLT 184 191 209  --   --   LABPROT 32.6* 25.8* 21.0*  --   --   INR 3.15* 2.34* 1.80*  --   --   HEPARINUNFRC  --   --   --  <0.10* <0.10*  CREATININE 1.32 1.02 1.12  --   --     Estimated Creatinine Clearance: 64.5 mL/min (by C-G formula based on Cr of 1.12).  Assessment: 80 y.o. male presents NSTEMI, h/o Afib, Coumadin on hold, for heparin   Goal of Therapy:  Heparin level 0.3-0.7 units/ml Monitor platelets by anticoagulation protocol: Yes   Plan:  Increase Heparin 1800 units/hr Follow-up am labs.   Phillis Knack, PharmD, BCPS

## 2014-06-10 NOTE — Progress Notes (Signed)
Paged by nursing staff as patient's SBP has been in 80s. Per family, his BP obtained from L arm has always been low. During interview, Mr. Sawyer appears to be comfortable and completely asymptomatic with this BP range. He is receiving IVF. He denies any discomfort. L radial cath site stable with TR band. No femoral approach per patient. ?if hypotension is related to sedation vs arterial issue. However since he is asymptomatic with no pain or dizziness, will continue to observe.  He requested tylenol and morphine sleep, however with currently SBP, would not use morphine which can drop his SBP further. Tylenol ok. Nursing staff to contact overnight PA/Fellow if any issue arise.   Physical exam: General: Alert and oriented, NAD Cardiac: RRR, paced rhythm on telemetry. Pacemaker noted over R pectoral side Pulm: O2 sat stable, anterior exam no obvious rale. Extremities: no significant edema noted.  Hilbert Corrigan PA Pager: [phone]

## 2014-06-10 NOTE — Consult Note (Signed)
[address]       [place] [address]             [phone]      Cardiothoracic Surgery Consultation  Reason for Consult: Severe multi-vessel coronary disease with severe LV dysfunction Referring Physician: Dr. Daneen Schick  Thomas [name] is an 80 y.o. male.  HPI:   The patient has a long history of coronary artery disease and LV dysfunction dating back to 2009 when he presented with syncope and shortness of breath and had an abnormal exercise stress test showing an inferior defect with an EF of 41%. Cath by Dr. Lia Foyer on 09/28/2007 showed a long area of recanalized dissected distal RCA with 80% stenosis prior to the small PDA and PL branches. The LAD had minimal irregularity and the LCX had 50% narrowing in the mid portion. He was treated medically for the RCA disease since the nuclear images showed no reversibility in the inferior defect. Then in 2012 he presented with congestive heart failure and his EF was 20% by echo. Cath on 10/07/2010 showed an EF of 20% as well as non-obstructive disease in the LAD and LCX. The RCA continued to have 80-90% distal stenosis with what looked like a chronic dissection distally with diffuse disease and calcification throughout the vessel. He was continued on medical therapy and had a BiV-ICD placed on 02/08/2011. He last saw Dr. Dannielle Burn in May 2013, then followed in [place] and now presented to [facility] ED on 06/01/2014 with recent onset of progressive lower extremity edema and shortness of breath. He was found to be in Afib with RVR and had an elevated troponin of 5. An echo on 12/21 showed an EF of 25-30% with diffuse hypokinesis. He was cardioverted to sinus rhythm since he has been on chronic coumadin with therapeutic INR. Interrogation of his device showed that he had gone into Afib about the time that his symptoms started. Cardia cath today new 50-70% proximal LAD narrowing and 90% mid vessel stenosis. The LCX has a new 99%  mid vessel stenosis with a large branching marginal. The RCA looks about the same as it has with diffuse disease and distal stenosis. LVEDP was 15.   He lives at home alone and has remained active until a month ago. He has Parkinson's disease which has slowed him down some.   Past Medical History  Diagnosis Date  . Coronary artery disease     80% distal RCA stenosis.  . Thyroid disease     HYPOTHYROIDISM  . Nontoxic multinodular goiter   . Psychosexual dysfunction with inhibited sexual excitement     ERECTILE DYSFUNCTION  . Hypertrophy of prostate without urinary obstruction and other lower urinary tract symptoms (LUTS)     BPH  . Hypertension   . Wide-complex tachycardia   . Parkinson's disease   . Status post biventricular cardiac pacemaker insertion     Status post CRT-D.    Past Surgical History  Procedure Laterality Date  . Cardiac catheterization    . Cardioversion N/A 06/03/2014    Procedure: CARDIOVERSION;  Surgeon: Thompson Grayer, MD;  Location: [facility] CATH LAB;  Service: Cardiovascular;  Laterality: N/A;    Family History  Problem Relation Age of Onset  . Heart attack Mother   . Thyroid disease Neg Hx   . Coronary artery disease Other     Social History:  reports that he quit smoking about 19 years ago. His smoking use included Cigarettes. He has  a 45 pack-year smoking history. He has never used smokeless tobacco. He reports that he does not drink alcohol or use illicit drugs.  Allergies: No Known Allergies  Medications:  I have reviewed the patient's current medications. Prior to Admission:  Prescriptions prior to admission  Medication Sig Dispense Refill Last Dose  . amantadine (SYMMETREL) 100 MG capsule Take 100 mg by mouth 2 (two) times daily.    06/01/2014 at Unknown time  . DULoxetine (CYMBALTA) 30 MG capsule Take 30 mg by mouth daily.     06/01/2014 at Unknown time  . metoprolol tartrate (LOPRESSOR) 25 MG tablet Take 12.5 mg by mouth 2 (two) times daily.    unk at 600  . Multiple Vitamins-Minerals (ICAPS MV PO) Take 2 by mouth twice daily    06/01/2014 at Unknown time  . Omega-3 Fatty Acids (FISH OIL) 1000 MG CAPS Take one by mouth twice daily     06/01/2014 at Unknown time  . rotigotine (NEUPRO) 6 MG/24HR Place 1 patch onto the skin daily.   06/01/2014 at Unknown time  . simvastatin (ZOCOR) 20 MG tablet Take 1 tablet (20 mg total) by mouth at bedtime. 30 tablet 0 06/01/2014 at Unknown time  . torsemide (DEMADEX) 100 MG tablet Take 50-100 mg by mouth daily as needed (edema).    06/01/2014 at Unknown time  . vitamin C (ASCORBIC ACID) 500 MG tablet Take 500 mg by mouth.   06/01/2014 at Unknown time  . warfarin (COUMADIN) 3 MG tablet Take 6 mg by mouth daily.   06/01/2014 at Unknown time  . nitroGLYCERIN (NITROSTAT) 0.4 MG SL tablet Place 0.4 mg under the tongue every 5 (five) minutes as needed.     not used  . [DISCONTINUED] carvedilol (COREG) 6.25 MG tablet Take 0.5 tablets (3.125 mg total) by mouth 2 (two) times daily. (Patient not taking: Reported on 06/02/2014)     . [DISCONTINUED] furosemide (LASIX) 40 MG tablet Take 1 tablet (40 mg total) by mouth daily. (Patient not taking: Reported on 06/02/2014)     . [DISCONTINUED] lisinopril (PRINIVIL,ZESTRIL) 5 MG tablet Take 0.5 tablets (2.5 mg total) by mouth daily. (Patient not taking: Reported on 06/02/2014)      Scheduled: . amantadine  100 mg Oral Daily  . amiodarone  200 mg Oral BID  . antiseptic oral rinse  7 mL Mouth Rinse q12n4p  . [START ON 06/11/2014] aspirin EC  81 mg Oral Daily  . atorvastatin  40 mg Oral q1800  . DULoxetine  30 mg Oral Daily  . isosorbide mononitrate  30 mg Oral Daily  . lisinopril  2.5 mg Oral Daily  . metoprolol succinate  12.5 mg Oral Daily  . potassium chloride  20 mEq Oral BID  . rotigotine  1 patch Transdermal Daily   And  . rotigotine  1 patch Transdermal Daily   Continuous: . sodium chloride Stopped (06/10/14 1350)  . sodium chloride    . heparin      TSV:[id number], morphine injection, ondansetron (ZOFRAN) IV, oxyCODONE-acetaminophen Anti-infectives    None      Results for orders placed or performed during the hospital encounter of 06/01/14 (from the past 48 hour(s))  Protime-INR     Status: Abnormal   Collection Time: 06/09/14  5:16 AM  Result Value Ref Range   Prothrombin Time 21.0 (H) 11.6 - 15.2 seconds   INR 1.80 (H) 0.00 - 1.49  CBC     Status: Abnormal   Collection Time: 06/09/14  5:16 AM  Result Value Ref Range   WBC 6.2 4.0 - 10.5 K/uL   RBC 3.92 (L) 4.22 - 5.81 MIL/uL   Hemoglobin 10.5 (L) 13.0 - 17.0 g/dL   HCT 33.7 (L) 39.0 - 52.0 %   MCV 86.0 78.0 - 100.0 fL   MCH 26.8 26.0 - 34.0 pg   MCHC 31.2 30.0 - 36.0 g/dL   RDW 13.4 11.5 - 15.5 %   Platelets 209 150 - 400 K/uL  Basic metabolic panel     Status: Abnormal   Collection Time: 06/09/14  5:16 AM  Result Value Ref Range   Sodium 136 135 - 145 mmol/L    Comment: Please note change in reference range.   Potassium 4.1 3.5 - 5.1 mmol/L    Comment: Please note change in reference range.   Chloride 100 96 - 112 mEq/L   CO2 31 19 - 32 mmol/L   Glucose, Bld 149 (H) 70 - 99 mg/dL   BUN 20 6 - 23 mg/dL   Creatinine, Ser 1.12 0.50 - 1.35 mg/dL   Calcium 8.3 (L) 8.4 - 10.5 mg/dL   GFR calc non Af Amer 61 (L) >90 mL/min   GFR calc Af Amer 71 (L) >90 mL/min    Comment: (NOTE) The eGFR has been calculated using the CKD EPI equation. This calculation has not been validated in all clinical situations. eGFR's persistently <90 mL/min signify possible Chronic Kidney Disease.    Anion gap 5 5 - 15  Heparin level (unfractionated)     Status: Abnormal   Collection Time: 06/09/14  4:47 PM  Result Value Ref Range   Heparin Unfractionated <0.10 (L) 0.30 - 0.70 IU/mL    Comment:        IF HEPARIN RESULTS ARE BELOW EXPECTED VALUES, AND PATIENT DOSAGE HAS BEEN CONFIRMED, SUGGEST FOLLOW UP TESTING OF ANTITHROMBIN III LEVELS.   Heparin level (unfractionated)      Status: Abnormal   Collection Time: 06/10/14 12:00 AM  Result Value Ref Range   Heparin Unfractionated <0.10 (L) 0.30 - 0.70 IU/mL    Comment:        IF HEPARIN RESULTS ARE BELOW EXPECTED VALUES, AND PATIENT DOSAGE HAS BEEN CONFIRMED, SUGGEST FOLLOW UP TESTING OF ANTITHROMBIN III LEVELS.   Basic metabolic panel     Status: Abnormal   Collection Time: 06/10/14  7:20 AM  Result Value Ref Range   Sodium 139 135 - 145 mmol/L    Comment: Please note change in reference range.   Potassium 4.6 3.5 - 5.1 mmol/L    Comment: Please note change in reference range.   Chloride 103 96 - 112 mEq/L   CO2 29 19 - 32 mmol/L   Glucose, Bld 92 70 - 99 mg/dL   BUN 22 6 - 23 mg/dL   Creatinine, Ser 1.04 0.50 - 1.35 mg/dL   Calcium 8.4 8.4 - 10.5 mg/dL   GFR calc non Af Amer 67 (L) >90 mL/min   GFR calc Af Amer 77 (L) >90 mL/min    Comment: (NOTE) The eGFR has been calculated using the CKD EPI equation. This calculation has not been validated in all clinical situations. eGFR's persistently <90 mL/min signify possible Chronic Kidney Disease.    Anion gap 7 5 - 15  CBC     Status: Abnormal   Collection Time: 06/10/14  7:20 AM  Result Value Ref Range   WBC 5.6 4.0 - 10.5 K/uL   RBC 4.09 (L) 4.22 - 5.81 MIL/uL   Hemoglobin  11.0 (L) 13.0 - 17.0 g/dL   HCT 35.1 (L) 39.0 - 52.0 %   MCV 85.8 78.0 - 100.0 fL   MCH 26.9 26.0 - 34.0 pg   MCHC 31.3 30.0 - 36.0 g/dL   RDW 13.4 11.5 - 15.5 %   Platelets 211 150 - 400 K/uL  Protime-INR     Status: Abnormal   Collection Time: 06/10/14  7:20 AM  Result Value Ref Range   Prothrombin Time 18.5 (H) 11.6 - 15.2 seconds   INR 1.53 (H) 0.00 - 1.49    No results found.  Review of Systems  Constitutional: Positive for malaise/fatigue. Negative for fever, chills, weight loss and diaphoresis.  HENT: Negative.   Eyes: Negative.   Respiratory: Positive for shortness of breath. Negative for cough and sputum production.   Cardiovascular: Positive for  palpitations, orthopnea and leg swelling. Negative for chest pain and PND.  Gastrointestinal: Negative.   Genitourinary: Negative.   Musculoskeletal: Negative.   Skin: Negative.   Neurological: Negative.  Negative for weakness.  Endo/Heme/Allergies: Negative.   Psychiatric/Behavioral: Negative.    Blood pressure 86/53, pulse 59, temperature 98.3 F (36.8 C), temperature source Oral, resp. rate 14, height 6' (1.829 m), weight 94 kg (207 lb 3.7 oz), SpO2 97 %. Physical Exam  Constitutional: He is oriented to person, place, and time. He appears well-developed and well-nourished. No distress.  HENT:  Head: Normocephalic and atraumatic.  Mouth/Throat: Oropharynx is clear and moist.  Eyes: EOM are normal. Pupils are equal, round, and reactive to light.  Neck: Normal range of motion. Neck supple. No thyromegaly present.  Cardiovascular: Normal rate, regular rhythm, normal heart sounds and intact distal pulses.   No murmur heard. Respiratory: Effort normal and breath sounds normal. No respiratory distress. He has no rales.  Few varicosities on left chest wall anteriorly.   ICD left anterior chest.  GI: Soft. Bowel sounds are normal. He exhibits no distension. There is no tenderness. There is no rebound.  Musculoskeletal: Normal range of motion.  Mild ankle edema  Lymphadenopathy:    He has no cervical adenopathy.  Neurological: He is alert and oriented to person, place, and time. He has normal strength. No sensory deficit.  Slow speech  Skin: Skin is warm and dry.  Psychiatric: He has a normal mood and affect.                 *[facility]*            [address] [place], [place] [address]              [phone]  ------------------------------------------------------------------- Transthoracic Echocardiography  Patient:  Robertt, Buda MR #:     [id number] Study Date: 06/02/2014 Gender:   M Age:    63 Height:   182.9 cm Weight:   90.7 kg BSA:    2.16 m^2 Pt. Status: Room:    [facility]  SONOGRAPHER Delman Kitten, RCS ORDERING   Mariel Sleet, M.D. ATTENDING  Ena Dawley, M.D. PERFORMING  [facility], Inpatient  cc:  ------------------------------------------------------------------- LV EF: 25% -  30%  ------------------------------------------------------------------- Indications:   CHF - 428.0.  ------------------------------------------------------------------- History:  PMH:  Atrial fibrillation. Coronary artery disease. Cardiomyopathy. Risk factors: Former tobacco use.  ------------------------------------------------------------------- Study Conclusions  - Left ventricle: The cavity size was normal. Wall  thickness was increased in a pattern of mild LVH. There was mild focal basal hypertrophy of the septum. Systolic function was severely reduced. The estimated ejection fraction was in the range of 25% to 30%. Diffuse hypokinesis. - Mitral valve: Calcified annulus. There was mild regurgitation. - Left atrium: The atrium was moderately dilated. - Pulmonary arteries: Systolic pressure was mildly increased. PA peak pressure: 39 mm Hg (S).  Impressions:  - Severe global reduction in LV function; moderate LAE; mild MR; mildly elevated pulmonary pressures.  Transthoracic echocardiography. M-mode, complete 2D, spectral Doppler, and color Doppler. Birthdate: Patient birthdate: [birth year]. Age: Patient is 80 yr old. Sex: Gender: male. BMI: 27.1 kg/m^2. Blood pressure:   84/62 Patient status: Inpatient. Study date: Study date: 06/02/2014. Study time: 11:15 AM. Location:  ICU/CCU  -------------------------------------------------------------------  ------------------------------------------------------------------- Left ventricle: The cavity size was normal. Wall thickness was increased in a pattern of mild LVH. There was mild focal basal hypertrophy of the septum. Systolic function was severely reduced. The estimated ejection fraction was in the range of 25% to 30%. Diffuse hypokinesis. The study was not technically sufficient to allow evaluation of LV diastolic dysfunction due to atrial fibrillation.  ------------------------------------------------------------------- Aortic valve:  Trileaflet; mildly calcified leaflets. Mobility was not restricted. Doppler: Transvalvular velocity was within the normal range. There was no stenosis. There was no regurgitation.  ------------------------------------------------------------------- Aorta: Aortic root: The aortic root was normal in size.  ------------------------------------------------------------------- Mitral valve:  Calcified annulus. Mobility was not restricted. Doppler: Transvalvular velocity was within the normal range. There was no evidence for stenosis. There was mild regurgitation.  ------------------------------------------------------------------- Left atrium: The atrium was moderately dilated.  ------------------------------------------------------------------- Right ventricle: The cavity size was normal. Pacer wire or catheter noted in right ventricle. Systolic function was normal.  ------------------------------------------------------------------- Pulmonic valve:  Doppler: Transvalvular velocity was within the normal range. There was no evidence for stenosis.  ------------------------------------------------------------------- Tricuspid valve:  Structurally normal valve.  Doppler: Transvalvular velocity was within the normal range. There was trivial  regurgitation.  ------------------------------------------------------------------- Pulmonary artery:  Systolic pressure was mildly increased.  ------------------------------------------------------------------- Right atrium: The atrium was normal in size. Pacer wire or catheter noted in right atrium.  ------------------------------------------------------------------- Pericardium: There was no pericardial effusion.  ------------------------------------------------------------------- Systemic veins: Inferior vena cava: The vessel was mildly dilated.  ------------------------------------------------------------------- Measurements  Left ventricle             Value    Reference LV ID, ED, PLAX chordal        47.2 mm   43 - 52 LV ID, ES, PLAX chordal    (H)   45.3 mm   23 - 38 LV fx shortening, PLAX chordal (L)   4   %   >=29 LV PW thickness, ED          12.1 mm   --------- IVS/LV PW ratio, ED      (H)   1.32     <=1.3  Ventricular septum           Value    Reference IVS thickness, ED           16  mm   ---------  Aorta                 Value    Reference Aortic root ID, ED           36  mm   ---------  Left atrium              Value  Reference LA ID, A-P, ES             39  mm   --------- LA ID/bsa, A-P             1.81 cm/m^2 <=2.2 LA volume, S              111  ml   --------- LA volume/bsa, S            51.4 ml/m^2 --------- LA volume, ES, 1-p A4C         85  ml   --------- LA volume/bsa, ES, 1-p A4C       39.3 ml/m^2 --------- LA volume, ES, 1-p A2C         122  ml   --------- LA volume/bsa, ES, 1-p A2C       56.5 ml/m^2 ---------  Pulmonary arteries           Value    Reference PA pressure,  S, DP       (H)   39  mm Hg <=30  Tricuspid valve            Value    Reference Tricuspid regurg peak velocity     246  cm/s  --------- Tricuspid peak RV-RA gradient     24  mm Hg ---------  Systemic veins             Value    Reference Estimated CVP             15  mm Hg ---------  Right ventricle            Value    Reference RV pressure, S, DP       (H)   39  mm Hg <=30  Legend: (L) and (H) mark values outside specified reference range.  ------------------------------------------------------------------- Prepared and Electronically Authenticated by  Kirk Ruths 2015-12-21T14:27:10     Left Heart Catheterization with Coronary Angiography and Abdominal Aortography Report  Joel Cowin  80 y.o.  male [birth year]  Procedure Date: 06/10/2014 Referring Physician: Marlou Porch Primary Cardiologist: Veatrice Bourbon, MD  INDICATIONS: Recurrent episodes of acute heart failure suggesting progression of coronary disease in the setting of baseline severe LV systolic dysfunction. Studies being done to exclude an ischemic component.  PROCEDURE: 1. Left heart catheterization; 2. Coronary angiography; 3. Left ventriculography; 4. Abdominal aortography with iliac runoff  CONSENT:  The risks, benefits, and details of the procedure were explained in detail to the patient. Risks including death, stroke, heart attack, kidney injury, allergy, limb ischemia, bleeding and radiation injury were discussed. The patient verbalized understanding and wanted to proceed. Informed written consent was obtained.  PROCEDURE TECHNIQUE: After Xylocaine anesthesia a a 5 French Slender sheath was placed in the riradial artery with an angiocath and the modified Seldinger technique. Coronary angiography was done using a 5 F JR4 and JL 3.5 cm diagnostic catheter. We also used an EBU 3.5 cm left  coronary guide catheter to get better images of the left coronary. Left ventriculography was not done but hemodynamics were recorded with the JR4 diagnostic catheter. A recent LVEF by echo was 25%. After reviewing the images, we decided to perform an abdominal aortogram to evaluate lower extremity vessel size and tortuosity in case LV support is needed during a PCI procedure.   CONTRAST: Total of 650 cc  COMPLICATIONS: 9   HEMODYNAMICS: Aortic pressure 95/50 mmHg; LV pressure 101/8 mmHg; LVEDP 15 mmHg  ANGIOGRAPHIC DATA: The left main coronary  artery contains segmental 30% narrowing.   The left anterior descending is severely diseased . There is eccentric proximal 50-70% narrowing. There is 90% mid vessel narrowing. The distal LAD wraps around the left ventricular apex and supplies the distal third of the inferior interventricular groove.  The left circumflex artery is moderate in size and heavily calcified. The proximal to mid vessel contains a 99% stenosis which is significantly progressed compared to 3 years ago. There is one large branching obtuse marginal that arises distal to the obstruction.  The right coronary artery is dominant but gives origin to a small posterior descending artery and 2 small left ventricular branches.  LEFT VENTRICULOGRAM: Left ventricular angiogram was not performed. LVEF EDP was 15 mmHg. EF by recent echo was 25%.  IMPRESSION: 1. Severe three-vessel coronary disease 2. Ischemic cardiomyopathy with recent EF 25% 3. Recurring episodes of acute heart failure    RECOMMENDATION: Consider multivessel coronary artery bypass grafting (high risk due to decreased LV function) versus high risk multivessel PCI with LV support using Impella . PCI and long-term use of triple drug anticoagulation/antiplatelet therapy is problematic.   Assessment/Plan:  I have personally reviewed his cardiac cath from today, 2012, and 2009 as well as all  echocardiograms. He has severe multi-vessel coronary disease with severe LV dysfunction presenting with congestive heart failure symptoms and troponin of 5 with Afib with RVR. His cath shows high grade stenoses in the LAD and LCX that were not there in 2012. His LAD and LCX had mild non-obstructive disease in 2012 but his LVEF had already deteriorated from 45% in 2009 to 20% in 2012. It was felt that this was non-ischemic cardiomyopathy. I suspect that his recent deterioration with CHF was related to baseline poor EF with new onset of Afib combined with these tight stenoses. He has had no angina. I think CABG would be very risky for him and would not improve his EF. He is 27 with Parkinson's disease and has already been in the hospital for 10 days. He would have a very tough time recovering from surgery and we would not be improving his EF. I think PCI would be a better option for him to preserve his remaining viable myocardium with a much better chance of recovery. I reviewed all of the data and my impression with the patient and his daughter and son-in-law. They seem to understand completely and will discuss the PCI option further with Dr. Tamala Julian.   Shelsey Rieth K 06/10/2014, 5:13 PM

## 2014-06-10 NOTE — CV Procedure (Signed)
     Left Heart Catheterization with Coronary Angiography and Abdominal Aortography Report  [name]  80 y.o.  male 12/22/1933  Procedure Date: 06/10/2014 Referring Physician: Marlou Porch Primary Cardiologist: Veatrice Bourbon, MD  INDICATIONS: Recurrent episodes of acute heart failure suggesting progression of coronary disease in the setting of baseline severe LV systolic dysfunction. Studies being done to exclude an ischemic component.  PROCEDURE: 1. Left heart catheterization; 2. Coronary angiography; 3. Left ventriculography; 4. Abdominal aortography with iliac runoff  CONSENT:  The risks, benefits, and details of the procedure were explained in detail to the patient. Risks including death, stroke, heart attack, kidney injury, allergy, limb ischemia, bleeding and radiation injury were discussed.  The patient verbalized understanding and wanted to proceed.  Informed written consent was obtained.  PROCEDURE TECHNIQUE:  After Xylocaine anesthesia a a 5 French Slender sheath was placed in the riradial artery with an angiocath and the modified Seldinger technique.  Coronary angiography was done using a 5 F JR4 and JL 3.5 cm diagnostic catheter.  We also used an EBU 3.5 cm left coronary guide catheter to get better images of the left coronary. Left ventriculography was not done but hemodynamics were recorded with the JR4 diagnostic catheter.  A recent LVEF by echo was 25%. After reviewing the images, we decided to perform an abdominal aortogram to evaluate lower extremity vessel size and tortuosity in case LV support is needed during a PCI procedure.    CONTRAST:  Total of 510 cc  COMPLICATIONS: 9   HEMODYNAMICS:  Aortic pressure 95/50 mmHg; LV pressure 101/8 mmHg; LVEDP 15 mmHg  ANGIOGRAPHIC DATA:   The left main coronary artery contains segmental 30% narrowing.   The left anterior descending is severely diseased . There is eccentric proximal 50-70% narrowing. There is 90% mid vessel  narrowing. The distal LAD wraps around the left ventricular apex and supplies the distal third of the inferior interventricular groove.  The left circumflex artery is moderate in size and heavily calcified. The proximal to mid vessel contains a 99% stenosis which is significantly progressed compared to 3 years ago. There is one large branching obtuse marginal that arises distal to the obstruction.  The right coronary artery is dominant but gives origin to a small  posterior descending artery and 2 small left ventricular branches.  LEFT VENTRICULOGRAM:  Left ventricular angiogram was not performed. LVEF EDP was 15 mmHg. EF by recent echo was 25%.  IMPRESSION: 1. Severe three-vessel coronary disease 2. Ischemic cardiomyopathy  with recent EF 25% 3. Recurring episodes of acute heart failure    RECOMMENDATION:   Consider multivessel coronary artery bypass grafting (high risk due to decreased LV function) versus high risk multivessel PCI with LV support using Impella . PCI and long-term use of triple drug anticoagulation/antiplatelet therapy is problematic.

## 2014-06-10 NOTE — Progress Notes (Signed)
Patient ID: Thomas [name], male   DOB: [birth year], 80 y.o.   MRN: [id number]    SUBJECTIVE:  Patient denies CP or dyspnea, awaiting cath. Trop 8. Recent cardioversion. Labs about to be drawn.    Filed Vitals:   06/09/14 1749 06/09/14 2033 06/09/14 2335 06/10/14 0415  BP: 117/45 99/48 100/49 115/63  Pulse: 59 60 60 60  Temp: 97.9 F (36.6 C) 98.5 F (36.9 C) 98.7 F (37.1 C) 97.8 F (36.6 C)  TempSrc: Oral Oral Axillary Axillary  Resp: 15 15 21 13   Height:      Weight:    207 lb 3.7 oz (94 kg)  SpO2: 99% 100%  97%     Intake/Output Summary (Last 24 hours) at 06/10/14 0814 Last data filed at 06/10/14 0700  Gross per 24 hour  Intake 1713.27 ml  Output   2400 ml  Net -686.73 ml    LABS: Basic Metabolic Panel:  Recent Labs  06/08/14 0305 06/09/14 0516  NA 139 136  K 4.2 4.1  CL 101 100  CO2 30 31  GLUCOSE 103* 149*  BUN 23 20  CREATININE 1.02 1.12  CALCIUM 8.4 8.3*   CBC:  Recent Labs  06/08/14 0305 06/09/14 0516  WBC 6.0 6.2  HGB 10.6* 10.5*  HCT 33.7* 33.7*  MCV 85.3 86.0  PLT 191 209    RADIOLOGY: Dg Chest Port 1 View  06/06/2014   CLINICAL DATA:  Hypertension. Coronary artery disease. Congestive heart failure.  EXAM: PORTABLE CHEST - 1 VIEW  COMPARISON:  06/04/2014  FINDINGS: Improved but not resolved bilateral perihilar airspace opacities. The patient is rotated to the Right on today's radiograph, reducing diagnostic sensitivity and specificity. Biapical pleural parenchymal scarring. Bilateral interstitial accentuation. Continued silhouetting of the left hemidiaphragm.  Considerable enlargement of the cardiopericardial silhouette. AICD in place. Thoracic spondylosis.  IMPRESSION: 1. Mild improvement in pulmonary edema.   Electronically Signed   By: Sherryl Barters M.D.   On: 06/06/2014 08:52   Dg Chest Port 1 View  06/04/2014   CLINICAL DATA:  Sudden onset shortness of breath  EXAM: PORTABLE CHEST - 1 VIEW  COMPARISON:  06/02/2014  FINDINGS:  Worsening perihilar airspace opacities suggest progressive edema. Small pleural effusions partly visualized. Cardiomegaly reidentified. Left AICD in place.  IMPRESSION: Progressive probable pulmonary edema.   Electronically Signed   By: Conchita Paris M.D.   On: 06/04/2014 02:52   Dg Chest Port 1 View  06/02/2014   CLINICAL DATA:  Congestive heart failure.  EXAM: PORTABLE CHEST - 1 VIEW  COMPARISON:  02/08/2011.  10/06/2010.  FINDINGS: Cardiac pacer in stable position. Severe stable cardiomegaly. Pulmonary venous congestion interstitial prominence suggest a component congestive heart failure. Underlying pneumonitis and/or chronic interstitial lung disease cannot be excluded. No acute bony abnormality.  IMPRESSION: 1. Severe cardiomegaly. Pulmonary venous congestion and interstitial prominence suggesting congestive heart failure. A component of pneumonitis and/or chronic interstitial lung disease cannot be excluded. 2. Cardiac pacer in stable position.   Electronically Signed   By: Marcello Moores  Register   On: 06/02/2014 07:17    PHYSICAL EXAM   WD, chronically ill appearing in NAD HEENT: normal Neck: supple Chest: no basilar crackles, ICD CV: RRR Abd: mildly distended; not tender Ext: trace edema Neuro:  Parkinsons   TELEMETRY: Sinus  ASSESSMENT AND PLAN:    Acute systolic congestive heart failure    80 year old with acute congestive heart failure in the setting of atrial fibrillation. His troponin was elevated. He underwent  cardioversion and there has been improvement in his symptoms. However per Dr. Ron Parker he had an episode of worsening congestive heart failure following cardioversion and Dr. Ron Parker feels this may have been ischemia mediated (troponin initially increased to 8.74). He therefore feels that cardiac catheterization should be performed this admission. Coumadin is on hold. Starting bridged with heparin given recent cardioversion. Continue present dose of Lasix and follow renal  function.    LBBB (left bundle branch block)    Cardiomyopathy-EF 25-30%       Continue low dose ACE-I and beta blocker    MDT BiV ICD 2012    Atrial fibrillation with RVR- DCCV 06/03/14    This was a new diagnosis this admission. He was cardioverted and is holding sinus rhythm. Continue amiodarone and decrease to 200 mg daily at discharge. Coumadin is on hold for cardiac catheterization. Heparin bridge given recent cardioversion.        NSVT (nonsustained ventricular tachycardia)    Chronic renal insufficiency, stage III (moderate)     Renal function improved. Holding Lasix  prior to his catheterization (cath most likely Tuesday).    CAD-single vessel (RCA) in 2012/NSTEMI      Continue aspirin 81 mg daily. Continue Lipitor. Cardiac catheterization today  INR should be less than 1.6.  Thomas [name], [place] 06/10/2014 8:14 AM

## 2014-06-11 LAB — CBC
HCT: 33.7 % — ABNORMAL LOW (ref 39.0–52.0)
HEMOGLOBIN: 10.6 g/dL — AB (ref 13.0–17.0)
MCH: 26.7 pg (ref 26.0–34.0)
MCHC: 31.5 g/dL (ref 30.0–36.0)
MCV: 84.9 fL (ref 78.0–100.0)
Platelets: 230 10*3/uL (ref 150–400)
RBC: 3.97 MIL/uL — AB (ref 4.22–5.81)
RDW: 13.4 % (ref 11.5–15.5)
WBC: 5.1 10*3/uL (ref 4.0–10.5)

## 2014-06-11 LAB — PROTIME-INR
INR: 1.36 (ref 0.00–1.49)
PROTHROMBIN TIME: 16.9 s — AB (ref 11.6–15.2)

## 2014-06-11 LAB — HEPARIN LEVEL (UNFRACTIONATED)
Heparin Unfractionated: 0.32 IU/mL (ref 0.30–0.70)
Heparin Unfractionated: 0.35 IU/mL (ref 0.30–0.70)

## 2014-06-11 MED ORDER — SODIUM CHLORIDE 0.9 % IV SOLN: 250.0000 mL | INTRAVENOUS | Status: DC | PRN

## 2014-06-11 MED ORDER — ASPIRIN 81 MG PO CHEW: 81.0000 mg | CHEWABLE_TABLET | ORAL | Status: DC

## 2014-06-11 MED ORDER — CLOPIDOGREL BISULFATE 300 MG PO TABS
600.0000 mg | ORAL_TABLET | Freq: Once | ORAL | Status: AC
  Administered 2014-06-11: 22:00:00 600 mg via ORAL
  Filled 2014-06-11: qty 2

## 2014-06-11 MED ORDER — SODIUM CHLORIDE 0.9 % IJ SOLN: 3.0000 mL | INTRAMUSCULAR | Status: DC | PRN

## 2014-06-11 MED ORDER — SODIUM CHLORIDE 0.9 % IV SOLN
INTRAVENOUS | Status: DC
  Administered 2014-06-12: 05:00:00 via INTRAVENOUS

## 2014-06-11 MED ORDER — ASPIRIN 81 MG PO CHEW
81.0000 mg | CHEWABLE_TABLET | ORAL | Status: AC
  Administered 2014-06-12: 06:00:00 81 mg via ORAL
  Filled 2014-06-11: qty 1

## 2014-06-11 MED ORDER — CLOPIDOGREL BISULFATE 75 MG PO TABS
600.0000 mg | ORAL_TABLET | Freq: Once | ORAL | Status: DC
  Filled 2014-06-11: qty 8

## 2014-06-11 MED ORDER — SODIUM CHLORIDE 0.9 % IV SOLN
INTRAVENOUS | Status: DC
Start: 2014-06-12 — End: 2014-06-12

## 2014-06-11 MED ORDER — SODIUM CHLORIDE 0.9 % IJ SOLN
3.0000 mL | Freq: Two times a day (BID) | INTRAMUSCULAR | Status: DC
  Administered 2014-06-12: 09:00:00 3 mL via INTRAVENOUS

## 2014-06-11 MED ORDER — LISINOPRIL 2.5 MG PO TABS
2.5000 mg | ORAL_TABLET | Freq: Every day | ORAL | Status: DC
Start: 2014-06-13 — End: 2014-06-15
  Administered 2014-06-13 – 2014-06-15 (×3): 2.5 mg via ORAL
  Filled 2014-06-11 (×3): qty 1

## 2014-06-11 NOTE — Progress Notes (Signed)
Physical Therapy Treatment Patient Details Name: Thomas [name] MRN: [id number] DOB: 1934/06/04 Today's Date: 06/11/2014    History of Present Illness 80 y.o. male with a history of CAD and mostly non-ischemic cardiomypathy, s/p CRT-D, parkinsons disease, who presents with 1 day of SOB.  He presented to [facility] ED, and was found to be in AFib with RVR and with an elevated troponin of 5.      PT Comments    Pt progressing to supervision with mobility and use of RW. Pt is a fall risk due to parkinsonian gt and tends to lean posteriorly. Pt reports he will D/C home with daughters. Will cont to follow per POC.   Follow Up Recommendations  Home health PT;Supervision/Assistance - 24 hour     Equipment Recommendations  Rolling walker with 5" wheels    Recommendations for Other Services       Precautions / Restrictions Precautions Precautions: Fall Restrictions Weight Bearing Restrictions: No    Mobility  Bed Mobility               General bed mobility comments: up in chair  Transfers Overall transfer level: Needs assistance Equipment used: Rolling walker (2 wheeled) Transfers: Sit to/from Stand           General transfer comment: cues for RW safety  Ambulation/Gait Ambulation/Gait assistance: Min guard;Supervision Ambulation Distance (Feet): 200 Feet Assistive device: Rolling walker (2 wheeled) Gait Pattern/deviations: Step-through pattern;Shuffle;Festinating;Decreased stride length Gait velocity: increased cadence    General Gait Details: pt with parkinsonian gt; improving to supervision with mobility and use of RW    Stairs            Wheelchair Mobility    Modified Rankin (Stroke Patients Only)       Balance Overall balance assessment: Needs assistance;History of Falls Sitting-balance support: Feet supported;No upper extremity supported Sitting balance-Leahy Scale: Good     Standing balance support: During functional  activity;Bilateral upper extremity supported Standing balance-Leahy Scale: Poor Standing balance comment: RW to balance                     Cognition Arousal/Alertness: Awake/alert Behavior During Therapy: Flat affect Overall Cognitive Status: Within Functional Limits for tasks assessed                      Exercises      General Comments General comments (skin integrity, edema, etc.): VSS thoughout session      Pertinent Vitals/Pain Pain Assessment: No/denies pain    Home Living                      Prior Function            PT Goals (current goals can now be found in the care plan section) Acute Rehab PT Goals Patient Stated Goal: to get moving  PT Goal Formulation: With patient/family Time For Goal Achievement: 06/18/14 Potential to Achieve Goals: Good Progress towards PT goals: Progressing toward goals    Frequency  Min 3X/week    PT Plan Current plan remains appropriate    Co-evaluation             End of Session Equipment Utilized During Treatment: Gait belt Activity Tolerance: Patient tolerated treatment well Patient left: with call bell/phone within reach;Other (comment) (on toilet; tech aware )     Time: [phone] PT Time Calculation (min) (ACUTE ONLY): 14 min  Charges:  $Gait Training: 8-22 mins  G CodesGustavus [name] , [place]  06/11/2014, 10:33 AM

## 2014-06-11 NOTE — Progress Notes (Addendum)
ANTICOAGULATION CONSULT NOTE - Follow Up Consult  Pharmacy Consult for heparin Indication: atrial fibrillation  No Known Allergies  Patient Measurements: Height: 6' (182.9 cm) Weight: 207 lb 3.7 oz (94 kg) IBW/kg (Calculated) : 77.6 Heparin Dosing Weight:   Vital Signs: Temp: 97.7 F (36.5 C) (12/30 0745) Temp Source: Oral (12/30 0745) BP: 90/46 mmHg (12/30 0745) Pulse Rate: 60 (12/30 0745)  Labs:  Recent Labs  06/09/14 0516 06/09/14 1647 06/10/14 06/10/14 0720 06/11/14 0448  HGB 10.5*  --   --  11.0* 10.6*  HCT 33.7*  --   --  35.1* 33.7*  PLT 209  --   --  211 230  LABPROT 21.0*  --   --  18.5* 16.9*  INR 1.80*  --   --  1.53* 1.36  HEPARINUNFRC  --  <0.10* <0.10*  --   --   CREATININE 1.12  --   --  1.04  --     Estimated Creatinine Clearance: 69.7 mL/min (by C-G formula based on Cr of 1.04).   Medications:  Scheduled:  . amantadine  100 mg Oral Daily  . amiodarone  200 mg Oral BID  . antiseptic oral rinse  7 mL Mouth Rinse q12n4p  . aspirin EC  81 mg Oral Daily  . atorvastatin  40 mg Oral q1800  . DULoxetine  30 mg Oral Daily  . isosorbide mononitrate  30 mg Oral Daily  . [START ON 06/13/2014] lisinopril  2.5 mg Oral Daily  . metoprolol succinate  12.5 mg Oral Daily  . rotigotine  1 patch Transdermal Daily   And  . rotigotine  1 patch Transdermal Daily   Infusions:  . sodium chloride Stopped (06/10/14 1350)  . [START ON 06/12/2014] sodium chloride    . heparin 1,800 Units/hr (06/11/14 0700)    Assessment: 80 yo male with afib s/p cath and pending potential PCI is currently on therapeutic heparin. Heparin level is 0.32.  Goal of Therapy:  Heparin level 0.3-0.7 units/ml Monitor platelets by anticoagulation protocol: Yes   Plan:  Cont Heparin gtt at 1800 units/hr. Repeat HL at 1630 to confirm AM heparin level, CBC F/u after PCI tom  Thomas [name], Thomas [name] 06/11/2014,8:41 AM  Addendum:  Heparin level therapeutic again this PM.  Cont same  rate.  Onnie Boer, PharmD Pager: [phone] 06/11/2014 6:46 PM

## 2014-06-11 NOTE — Progress Notes (Addendum)
Subjective:  No CP. BP soft last evening. Spoke with Dr. Tamala Julian this morning. Plan is to perform PCI tomorrow.   Objective:  Vital Signs in the last 24 hours: Temp:  [97.5 F (36.4 C)-98.3 F (36.8 C)] 97.7 F (36.5 C) (12/30 0745) Pulse Rate:  [44-82] 60 (12/30 0745) Resp:  [11-20] 18 (12/30 0745) BP: (78-124)/(44-67) 90/46 mmHg (12/30 0745) SpO2:  [94 %-100 %] 95 % (12/30 0745) Weight:  [207 lb 3.7 oz (94 kg)] 207 lb 3.7 oz (94 kg) (12/30 0100)  Intake/Output from previous day: 12/29 0701 - 12/30 0700 In: 572 [P.O.:170; I.V.:402] Out: 2230 [Urine:2230]   Physical Exam: WD, chronically ill appearing in NAD HEENT: normal Neck: supple Chest: no basilar crackles, ICD CV: RRR Abd: mildly distended; not tender Ext: trace edema Neuro: Parkinsons    Lab Results:  Recent Labs  06/10/14 0720 06/11/14 0448  WBC 5.6 5.1  HGB 11.0* 10.6*  PLT 211 230    Recent Labs  06/09/14 0516 06/10/14 0720  NA 136 139  K 4.1 4.6  CL 100 103  CO2 31 29  GLUCOSE 149* 92  BUN 20 22  CREATININE 1.12 1.04   Telemetry: AV paced Personally viewed.  Scheduled Meds: . amantadine  100 mg Oral Daily  . amiodarone  200 mg Oral BID  . antiseptic oral rinse  7 mL Mouth Rinse q12n4p  . aspirin EC  81 mg Oral Daily  . atorvastatin  40 mg Oral q1800  . DULoxetine  30 mg Oral Daily  . isosorbide mononitrate  30 mg Oral Daily  . [START ON 06/13/2014] lisinopril  2.5 mg Oral Daily  . metoprolol succinate  12.5 mg Oral Daily  . rotigotine  1 patch Transdermal Daily   And  . rotigotine  1 patch Transdermal Daily   Continuous Infusions: . sodium chloride Stopped (06/10/14 1350)  . [START ON 06/12/2014] sodium chloride    . heparin 1,800 Units/hr (06/11/14 0700)   PRN Meds:.acetaminophen, morphine injection, ondansetron (ZOFRAN) IV, oxyCODONE-acetaminophen   Assessment/Plan:  Principal Problem:   Acute systolic congestive heart failure Active Problems:   LBBB (left bundle branch  block)   Non-ischemic cardiomyopathy-EF 25-30%   MDT BiV ICD 2012   Parkinson's disease   Atrial fibrillation with RVR- DCCV 06/03/14   NSVT (nonsustained ventricular tachycardia)   NSTEMI- Troponin 8   Chronic renal insufficiency, stage III (moderate)   CAD-single vessel (RCA) in 2012   Warfarin anticoagulation   On amiodarone therapy  80 year old with severe CAD, non surgical candidate, EF 25%, AFIB, Pacer/ICD, Parkinson's, NSTEMI.  CAD  - progression of circ to 99%, LAD 90% mid  - worsening of EF from 45 to 25%   - Non surgical candidate (Dr. Cyndia Bent)  - Plan PCI tomorrow Dr. Tamala Julian (I have written orders)  - Currently on IV heparin  Both ischemic and non ischemic cardiomyopathy  - low dose Bb and ACE-I because of hypotension  - EF 25%  - Creat improved from 1.8 to 1.36 with diuresis and control of ventricular rate from AFIB to NSR.  - Holding lasix in anticipation of cath PCI tomorrow.   ICD  - stable, pacing noted  AFIB  - parox  - AMIO with cardioversion recently   - Coumadin on hold  - Will need triple therapy (ASA, PLAVIX, COUMADIN) post PCI. Increased bleeding risk.   NSTEMI  - Trop peak 8  - CAD as above  SKAINS, MARK, MD  SKAINS, [place] 06/11/2014, 9:27 AM

## 2014-06-11 NOTE — Progress Notes (Signed)
Interventional Cardiology Note  Spoke with Dr. Cyndia Bent yesterday and his thoughts and recommendations are appreciated. Patient is felt to have unacceptable surgical risk.  Long conversation with the patient and his daughter this AM. We have decided to proceed with risk PCI of the LAD and Circumflex. Risk is related to low LVEF, calcified arteries, and soft blood pressures.  Procedure will be performed with LV support using Impella LVAD. Rotational atherectomy will also be required on LAD and possibly the circumflex.  Higher than usual risk of stroke, death, MI, bleeding, kidney injury and limb ischemia discussed in detail and accepted.   Will load with plavix and anticipate Coumadin, aspin ,and plavix combo for 2-3 months, followed by aspirin and plavix thereafter.  All questions were answered and decision to proceed as second case tomorrow.

## 2014-06-12 ENCOUNTER — Encounter (HOSPITAL_COMMUNITY)
Admission: AD | Disposition: A | Discharge status: Home Health | Payer: Medicare Other | Source: Other Acute Inpatient Hospital | Attending: Cardiology

## 2014-06-12 ENCOUNTER — Encounter (HOSPITAL_COMMUNITY): Payer: Self-pay | Admitting: Interventional Cardiology

## 2014-06-12 DIAGNOSIS — I5023 Acute on chronic systolic (congestive) heart failure: Secondary | ICD-10-CM | POA: Insufficient documentation

## 2014-06-12 HISTORY — PX: CARDIAC CATHETERIZATION: SHX172

## 2014-06-12 HISTORY — PX: PERCUTANEOUS CORONARY STENT INTERVENTION (PCI-S): SHX5485

## 2014-06-12 HISTORY — PX: PERCUTANEOUS CORONARY ROTOBLATOR INTERVENTION (PCI-R): SHX5484

## 2014-06-12 LAB — POCT ACTIVATED CLOTTING TIME
Activated Clotting Time: 214 seconds
Activated Clotting Time: 171 seconds
Activated Clotting Time: 171 seconds
Activated Clotting Time: 153 seconds

## 2014-06-12 LAB — CBC
HCT: 32.8 % — ABNORMAL LOW (ref 39.0–52.0)
Hemoglobin: 10.4 g/dL — ABNORMAL LOW (ref 13.0–17.0)
MCH: 26.7 pg (ref 26.0–34.0)
MCHC: 31.7 g/dL (ref 30.0–36.0)
MCV: 84.3 fL (ref 78.0–100.0)
Platelets: 210 10*3/uL (ref 150–400)
RBC: 3.89 MIL/uL — AB (ref 4.22–5.81)
RDW: 13.5 % (ref 11.5–15.5)
WBC: 4.6 10*3/uL (ref 4.0–10.5)

## 2014-06-12 LAB — BASIC METABOLIC PANEL
Anion gap: 5 (ref 5–15)
BUN: 17 mg/dL (ref 6–23)
CO2: 28 mmol/L (ref 19–32)
Calcium: 8.4 mg/dL (ref 8.4–10.5)
Chloride: 104 mEq/L (ref 96–112)
Creatinine, Ser: 1.11 mg/dL (ref 0.50–1.35)
GFR calc Af Amer: 71 mL/min — ABNORMAL LOW (ref >90)
GFR calc non Af Amer: 62 mL/min — ABNORMAL LOW (ref >90)
Glucose, Bld: 103 mg/dL — ABNORMAL HIGH (ref 70–99)
Potassium: 4.4 mmol/L (ref 3.5–5.1)
Sodium: 137 mmol/L (ref 135–145)

## 2014-06-12 LAB — PROTIME-INR
INR: 1.37 (ref 0.00–1.49)
Prothrombin Time: 17 seconds — ABNORMAL HIGH (ref 11.6–15.2)

## 2014-06-12 LAB — HEPARIN LEVEL (UNFRACTIONATED): Heparin Unfractionated: 0.4 IU/mL (ref 0.30–0.70)

## 2014-06-12 SURGERY — PERCUTANEOUS CORONARY STENT INTERVENTION (PCI-S)
Anesthesia: LOCAL

## 2014-06-12 MED ORDER — NITROGLYCERIN 1 MG/10 ML FOR IR/CATH LAB
INTRA_ARTERIAL | Status: AC
  Filled 2014-06-12: qty 10

## 2014-06-12 MED ORDER — HEPARIN SODIUM (PORCINE) 1000 UNIT/ML IJ SOLN
INTRAMUSCULAR | Status: AC
  Filled 2014-06-12: qty 1

## 2014-06-12 MED ORDER — NITROGLYCERIN IN D5W 200-5 MCG/ML-% IV SOLN
INTRAVENOUS | Status: AC
  Filled 2014-06-12: qty 250

## 2014-06-12 MED ORDER — SODIUM CHLORIDE 0.9 % IV SOLN
1.7500 mg/kg/h | INTRAVENOUS | Status: AC
  Administered 2014-06-12: 1.75 mg/kg/h via INTRAVENOUS
  Filled 2014-06-12: qty 250

## 2014-06-12 MED ORDER — BIVALIRUDIN 250 MG IV SOLR
INTRAVENOUS | Status: AC
  Filled 2014-06-12: qty 250

## 2014-06-12 MED ORDER — HEPARIN (PORCINE) IN NACL 2-0.9 UNIT/ML-% IJ SOLN
INTRAMUSCULAR | Status: AC
  Filled 2014-06-12: qty 500

## 2014-06-12 MED ORDER — BIVALIRUDIN 250 MG IV SOLR
0.2500 mg/kg/h | INTRAVENOUS | Status: DC
  Filled 2014-06-12: qty 250

## 2014-06-12 MED ORDER — LIDOCAINE HCL (PF) 1 % IJ SOLN
INTRAMUSCULAR | Status: AC
  Filled 2014-06-12: qty 30

## 2014-06-12 MED ORDER — SODIUM CHLORIDE 0.9 % IV SOLN
INTRAVENOUS | Status: AC
  Administered 2014-06-12: 13:00:00 via INTRAVENOUS

## 2014-06-12 MED ORDER — MIDAZOLAM HCL 2 MG/2ML IJ SOLN
INTRAMUSCULAR | Status: AC
  Filled 2014-06-12: qty 2

## 2014-06-12 MED ORDER — HEPARIN (PORCINE) IN NACL 2-0.9 UNIT/ML-% IJ SOLN
INTRAMUSCULAR | Status: AC
  Filled 2014-06-12: qty 1500

## 2014-06-12 MED ORDER — WARFARIN - PHARMACIST DOSING INPATIENT
Freq: Every day | Status: DC
  Administered 2014-06-12: 20:00:00

## 2014-06-12 MED ORDER — VERAPAMIL HCL 2.5 MG/ML IV SOLN
INTRAVENOUS | Status: AC
  Filled 2014-06-12: qty 4

## 2014-06-12 MED ORDER — FENTANYL CITRATE 0.05 MG/ML IJ SOLN
INTRAMUSCULAR | Status: AC
  Filled 2014-06-12: qty 2

## 2014-06-12 MED ORDER — WARFARIN SODIUM 6 MG PO TABS
6.0000 mg | ORAL_TABLET | Freq: Once | ORAL | Status: AC
  Administered 2014-06-12: 6 mg via ORAL
  Filled 2014-06-12: qty 1

## 2014-06-12 MED ORDER — CLOPIDOGREL BISULFATE 75 MG PO TABS
ORAL_TABLET | ORAL | Status: AC
  Filled 2014-06-12: qty 2

## 2014-06-12 MED ORDER — CLOPIDOGREL BISULFATE 75 MG PO TABS
75.0000 mg | ORAL_TABLET | Freq: Every day | ORAL | Status: DC
  Filled 2014-06-12: qty 1

## 2014-06-12 NOTE — CV Procedure (Addendum)
Left Heart Catheterization with Coronary Angiography, Impella (LVAD) with 2-Vessel DES Report  Thomas [name]  80 y.o.  male 1934/05/13  Procedure Date: 06/12/2014 Referring Physician: Olin Pia, M.D. Primary Cardiologist:: Jolyn Nap, M.D.  INDICATIONS: Severe three-vessel coronary disease with severe left ventricular systolic dysfunction (EF 16%). He recently presented with a non-ST elevation MI in setting of new onset atrial fibrillation. He was evaluated by surgery and was not felt to be a surgical candidate due to poor LV function and other comorbidities. He therefore comes to the cath lab today for high risk PCI and rotational atherectomy of the LAD and circumflex artery with left ventricular assist using Impella.  PROCEDURE: 1. Left heart catheterization; 2. Insertion of Impell LVAD; 3. Coronary angiography; 4. Angioplasty and DE stent implantation circumflex coronary artery; 5. Rotational atherectomy and DE stent implantation mid LAD;  CONSENT:  The risks, benefits, and details of the procedure were explained in detail to the patient. Risks including death, stroke, heart attack, kidney injury, allergy, limb ischemia, bleeding and radiation injury were discussed.  The patient verbalized understanding and wanted to proceed.  Informed written consent was obtained.  PROCEDURE TECHNIQUE:  After Xylocaine anesthesia a a 7 French sheath sheath was placed in the right femoral artery using the modified Seldinger technique.  We then moved to the left femoral artery where a 5 French sheath was placed using the Seldinger technique. We then used sequential dilatation with an 8 [place], 10 [place], and 12 [place] dilator. We then inserted the 14 [place] and pellet sheath. All progressive dilatations were done with an extra-support wire. We then placed the 0.08 exchange wire in the left ventricle within a pigtail catheter. The pigtail catheter was then removed and the Impella was advanced and  positioned fluoroscopically. After adjusting the support device we then turned our attention to coronary angiography.  Left ventricular pressure was measured with the pigtail catheter.  Bivalirudin bolus and infusion was started. The patient had been loaded with Plavix within the previous 12 hours.   Coronary angiography was done using a 7 French 3.5 cm XB guide catheter was used to obtain guiding shots. After some consideration, we decided to advance a pro-water guidewire into the circumflex. Rather than rotational atherectomy, I chose to predilated with a 2.0 x 12 mm long balloon. Good expansion was noted. We then used a 2.75 x 10 mm Flextome was used and 2 inflations were performed. We had difficulty advancing the flexed home through the proximal circumflex due to tortuosity and calcification. I then used a 2.5 x 8 mm [place] balloon and further dilated the lesion. We then positioned and deployed a 3.0 x 16 mm Promus Premier to 14 atm. Postdilatation was performed with a 3.5 x 8 mm [place] Emerge to 14 atm. The final angiographic result was felt to be acceptable.  We then directed our attention to the left anterior descending. We advanced a 0.009 Rotafloppy wire into the distal LAD. We then perform rotational atherectomy using a 1.5 mm RotaLink burr. Ramp speed was established to be 169 rpm. We then did multiple runs with ramp decelerations into the high 150s. Total atherectomy time was 96 seconds. We then removed the Rotafloppy wire and advanced a pro-water wire into the LAD. We positioned and deployed a 2.75 x 32 mm long [employer] atmospheres. Postdilatation was then performed with 3.25 x 15 [place] Emerge to 14 atm overlapping throughout the stented segment. Doing rotational atherectomy the patient developed significant hypotension  but was clinically irrelevant with left ventricular assist from the Impella.  Angio-Seal was used to close the right femoral arteriotomy site. The 60 French left femoral sheath  will be removed when the ACT is less than 200. The patient will be transported to the stepdown unit on 2H.   CONTRAST:  Total of 260 cc.  COMPLICATIONS:  Mild hypotension doing rotational atherectomy of the LAD but without clinical consequences due to Impella support.   HEMODYNAMICS:  Aortic pressure 127/70 mmHg; LV pressure 127 over 15 mmHg; LVEDP 23 mmHg  ANGIOGRAPHIC DATA:   The left main coronary artery is widely patent.  The left anterior descending artery is patent but contains ostial eccentric 50% narrowing. The mid LAD contains segmental 95% stenosis within a calcified segment.  The left circumflex artery is severely diseased in the proximal to mid segment after a region of heavy calcification and a 70 bend. Within the mid segment there is 99% stenosis. The distal vessel bifurcates into 2 large branches.   The right coronary artery is not visualized.  PCI RESULTS: 1. Angioplasty followed by stenting of the circumflex reduced a 99% stenosis to less than 10%. High pressure deployment was performed. Final balloon diameter was 3.5 mm.  2. Rotational atherectomy followed by stenting and high pressure post dilatation of the LAD reduced a 90% stenosis to 0% with TIMI grade 3 flow. Final balloon diameter was 3.25 mm at 14 atm.   LEFT VENTRICULOGRAM:  Left ventricular angiogram was not done. The LVEDP was recorded.   IMPRESSIONS:  1. High risk multivessel PCI in setting of severe left ventricular systolic dysfunction performed using left ventricular assist device, Impella. 2. Successful PTCA and DE stent in the mid circumflex reducing a 99% stenosis to less than 10% with TIMI grade 3 flow. 3. Successful rotational atherectomy and drug-eluting stent implantation in the mid LAD reducing a 90% segmental stenosis to 0% with TIMI grade 3 flow 4. Severe mixed ischemic and nonischemic cardiomyopathy with EF less than 25%.   RECOMMENDATION:  1.Remove the left femoral sheath later today once the  ACT is less than 175 seconds. 2. Aspirin, Plavix, and Coumadin 3. P2 Y12 in a.m. If less than 230 no action is needed. If greater than 230, we will need to switch to Brilinta and discontinue Coumadin. 4. INR does not need to be therapeutic prior to discharge. We'll need to have very close and early follow-up after discharge with reference to anticoagulation. He cannot be discharged until we are certain that close follow-up as possible. 5. PT eval to determine home needs. 6. Anticipate discharge over the weekend. May need short term assisted living arrangement as he lives at home alone. 7. Resume and optimize heart failure therapy. No heparin due to left goin access for Impella and risk of bleeding.

## 2014-06-12 NOTE — Progress Notes (Addendum)
ANTICOAGULATION CONSULT NOTE - Follow Up Consult  Pharmacy Consult for heparin>>d/c s/p PCI, resume Coumadin Indication: atrial fibrillation  No Known Allergies  Patient Measurements: Height: 6' (182.9 cm) Weight: 208 lb 8.9 oz (94.6 kg) IBW/kg (Calculated) : 77.6 Heparin Dosing Weight:   Vital Signs: Temp: 98.4 F (36.9 C) (12/31 0809) Temp Source: Oral (12/31 0809) BP: 177/64 mmHg (12/31 0809) Pulse Rate: 60 (12/31 0809)  Labs:  Recent Labs  06/10/14 0720 06/11/14 0448 06/11/14 0836 06/11/14 1650 06/12/14 0400  HGB 11.0* 10.6*  --   --  10.4*  HCT 35.1* 33.7*  --   --  32.8*  PLT 211 230  --   --  210  LABPROT 18.5* 16.9*  --   --  17.0*  INR 1.53* 1.36  --   --  1.37  HEPARINUNFRC  --   --  0.32 0.35 0.40  CREATININE 1.04  --   --   --  1.11    Estimated Creatinine Clearance: 65.5 mL/min (by C-G formula based on Cr of 1.11).   Medications:  Scheduled:  . amantadine  100 mg Oral Daily  . amiodarone  200 mg Oral BID  . antiseptic oral rinse  7 mL Mouth Rinse q12n4p  . aspirin EC  81 mg Oral Daily  . atorvastatin  40 mg Oral q1800  . DULoxetine  30 mg Oral Daily  . isosorbide mononitrate  30 mg Oral Daily  . [START ON 06/13/2014] lisinopril  2.5 mg Oral Daily  . metoprolol succinate  12.5 mg Oral Daily  . rotigotine  1 patch Transdermal Daily   And  . rotigotine  1 patch Transdermal Daily  . sodium chloride  3 mL Intravenous Q12H   Infusions:  . sodium chloride 50 mL/hr at 06/12/14 0500  . sodium chloride Stopped (06/12/14 0500)  . heparin 1,800 Units/hr (06/12/14 0600)    Assessment: 80 yo male with afib is currently on therapeutic heparin.  Heparin level this am was 0.4, Hgb 10.4 and Plt 210 K. Patient will have Impella and PCI today.  Goal of Therapy:  Heparin level 0.3-0.7 units/ml Monitor platelets by anticoagulation protocol: Yes   Plan:  Cont Heparin gtt at 1800 units/hr.  AM heparin level, CBC if heparin will be continued F/u after  Impella and PCI today  So, Tsz-Yin 06/12/2014,8:44 AM  Addendum: Patient is now s/p 2 vessel PCI to circumflex and LAD. He required an impella during the procedure but the impella is now out. Heparin has been discontinued and his home coumadin will be resumed. INR 1.37. Home dose 6mg  daily with last dose taken 12/23. No reversal agents given. Patient was started on IV amiodarone but has been transitioned to PO - amiodarone is new for him so his coumadin requirements at discharge may decrease.  Plan: 1) Coumadin 6mg  x 1 2) Daily INR  Nena Jordan, PharmD, BCPS 06/12/2014, 12:54 PM

## 2014-06-12 NOTE — Interval H&P Note (Signed)
Cath Lab Visit (complete for each Cath Lab visit)  Clinical Evaluation Leading to the Procedure:   ACS: Yes.    Non-ACS:    Anginal Classification: CCS III  Anti-ischemic medical therapy: Maximal Therapy (2 or more classes of medications)  Non-Invasive Test Results: No non-invasive testing performed  Prior CABG: No previous CABG      History and Physical Interval Note:  06/12/2014 9:31 AM  Thomas [name]  has presented today for surgery, with the diagnosis of cad  The various methods of treatment have been discussed with the patient and family. After consideration of risks, benefits and other options for treatment, the patient has consented to  Procedure(s): PERCUTANEOUS CORONARY STENT INTERVENTION (PCI-S) (N/A) as a surgical intervention .  The patient's history has been reviewed, patient examined, no change in status, stable for surgery.  I have reviewed the patient's chart and labs.  Questions were answered to the patient's satisfaction.     Sinclair Grooms

## 2014-06-12 NOTE — Progress Notes (Signed)
Bedside report given to [facility].  To cath lab per stretcher on port monitor.

## 2014-06-12 NOTE — Progress Notes (Addendum)
The patient has had a quiet night. Laboratory data is unremarkable. He denies angina. He is able lie flat without dyspnea. Bilateral femoral pulses are 2+ and symmetric.  The patient is a 5 high-risk PCI today. He will have Impella insertion in the left groin. We then plan to do two vessel PCI that will likely include rotational atherectomy followed by stenting.  The patient has severe LV dysfunction with an EF of 25%. The procedural risk is increased because of depressed LV function, heavily calcified coronaries, and multivessel intervention. He has been turned down for surgical revascularization. The patient and family are aware of the risk associated with the procedure.

## 2014-06-12 NOTE — Progress Notes (Signed)
PT Cancellation Note  Patient Details Name: Thomas [name] MRN: [id number] DOB: 1933-06-15   Cancelled Treatment:    Reason Eval/Treat Not Completed: Patient at procedure or test/unavailable. Pt with nursing and large group of family preparing to leave unit for procedure. Will return to follow up as schedule allows.     Rolinda Roan 06/12/2014, 9:19 AM   Rolinda Roan, PT, DPT Acute Rehabilitation Services Pager: [phone]

## 2014-06-12 NOTE — Progress Notes (Signed)
Dr. Tamala Julian removed 14 french sheath from the left femoral artery and I took over holding pressure after he had control of the site.  Vitals stable throughout sheath removal and pressure was held for a total of 35 minutes.  A pressure dressing was applied to the site and the site was a level 0 with no hematoma post removal.  Instructions were given for bedrest and RN at bedside.  Distal pulses were 2+.

## 2014-06-12 NOTE — Progress Notes (Signed)
Subjective: No CP  Objective: Vital signs in last 24 hours: Temp:  [97.7 F (36.5 C)-98.5 F (36.9 C)] 98.5 F (36.9 C) (12/31 0600) Pulse Rate:  [59-60] 59 (12/31 0600) Resp:  [16-18] 18 (12/31 0600) BP: (90-116)/(44-68) 112/44 mmHg (12/31 0600) SpO2:  [94 %-100 %] 94 % (12/31 0600) Weight:  [207 lb 14.3 oz (94.3 kg)-208 lb 8.9 oz (94.6 kg)] 208 lb 8.9 oz (94.6 kg) (12/31 0228) Last BM Date: 06/11/14  Intake/Output from previous day: 12/30 0701 - 12/31 0700 In: 804 [P.O.:660; I.V.:144] Out: 1000 [Urine:1000] Intake/Output this shift:    Medications Current Facility-Administered Medications  Medication Dose Route Frequency Provider Last Rate Last Dose  . 0.9 %  sodium chloride infusion   Intravenous Continuous Erlene Quan, PA-C 50 mL/hr at 06/12/14 0500    . 0.9 %  sodium chloride infusion  250 mL Intravenous PRN Candee Furbish, MD      . 0.9 %  sodium chloride infusion   Intravenous Continuous Candee Furbish, MD   0  at 06/12/14 0400  . acetaminophen (TYLENOL) tablet 650 mg  650 mg Oral Q4H PRN Stephani Police, MD   650 mg at 06/10/14 2122  . amantadine (SYMMETREL) capsule 100 mg  100 mg Oral Daily Dayna N Dunn, PA-C   100 mg at 06/11/14 0940  . amiodarone (PACERONE) tablet 200 mg  200 mg Oral BID Thompson Grayer, MD   200 mg at 06/11/14 2102  . antiseptic oral rinse (CPC / CETYLPYRIDINIUM CHLORIDE 0.05%) solution 7 mL  7 mL Mouth Rinse q12n4p Dorothy Spark, MD   7 mL at 06/10/14 1350  . aspirin EC tablet 81 mg  81 mg Oral Daily Dorothy Spark, MD   81 mg at 06/11/14 0940  . atorvastatin (LIPITOR) tablet 40 mg  40 mg Oral q1800 Lelon Perla, MD   40 mg at 06/10/14 2121  . DULoxetine (CYMBALTA) DR capsule 30 mg  30 mg Oral Daily Dayna N Dunn, PA-C   30 mg at 06/11/14 9528  . heparin ADULT infusion 100 units/mL (25000 units/250 mL)  1,800 Units/hr Intravenous Continuous Dorothy Spark, MD 18 mL/hr at 06/12/14 0600 1,800 Units/hr at 06/12/14 0600  . isosorbide mononitrate  (IMDUR) 24 hr tablet 30 mg  30 mg Oral Daily Erlene Quan, PA-C   30 mg at 06/11/14 0940  . [START ON 06/13/2014] lisinopril (PRINIVIL,ZESTRIL) tablet 2.5 mg  2.5 mg Oral Daily Luke K Kilroy, PA-C      . metoprolol succinate (TOPROL-XL) 24 hr tablet 12.5 mg  12.5 mg Oral Daily Lelon Perla, MD   12.5 mg at 06/11/14 0941  . morphine 4 MG/ML injection 3 mg  3 mg Intravenous Q2H PRN Lamar Sprinkles, MD   3 mg at 06/10/14 0301  . ondansetron (ZOFRAN) injection 4 mg  4 mg Intravenous Q6H PRN Stephani Police, MD      . oxyCODONE-acetaminophen (PERCOCET/ROXICET) 5-325 MG per tablet 1-2 tablet  1-2 tablet Oral Q4H PRN Sinclair Grooms, MD   2 tablet at 06/11/14 2102  . rotigotine (NEUPRO) 4 MG/24HR 1 patch  1 patch Transdermal Daily Arty Baumgartner, [facility]   1 patch at 06/11/14 4132   And  . rotigotine (NEUPRO) 2 MG/24HR 1 patch  1 patch Transdermal Daily Arty Baumgartner, [facility]   1 patch at 06/11/14 4401  . sodium chloride 0.9 % injection 3 mL  3 mL Intravenous PRN Erlene Quan, PA-C      .  sodium chloride 0.9 % injection 3 mL  3 mL Intravenous Q12H Candee Furbish, MD   3 mL at 06/11/14 2101  . sodium chloride 0.9 % injection 3 mL  3 mL Intravenous PRN Candee Furbish, MD        PE: General appearance: alert, cooperative, no distress and Patient was sleeping comfortably when I entered.  Lungs: Bilateral rales Heart: regular rate and rhythm, S1, S2 normal, no murmur, click, rub or gallop Abdomen: +BS nontender Extremities: No LEE Pulses: 2+ and symmetric Skin: Warm and dry Neurologic: Grossly normal  Lab Results:   Recent Labs  06/10/14 0720 06/11/14 0448 06/12/14 0400  WBC 5.6 5.1 4.6  HGB 11.0* 10.6* 10.4*  HCT 35.1* 33.7* 32.8*  PLT 211 230 210   BMET  Recent Labs  06/10/14 0720 06/12/14 0400  NA 139 137  K 4.6 4.4  CL 103 104  CO2 29 28  GLUCOSE 92 103*  BUN 22 17  CREATININE 1.04 1.11  CALCIUM 8.4 8.4   PT/INR  Recent Labs  06/10/14 0720 06/11/14 0448  06/12/14 0400  LABPROT 18.5* 16.9* 17.0*  INR 1.53* 1.36 1.37    Assessment/Plan  Principal Problem:   Acute systolic congestive heart failure Active Problems:   LBBB (left bundle branch block)   Non-ischemic cardiomyopathy-EF 25-30%   MDT BiV ICD 2012   Parkinson's disease   Atrial fibrillation with RVR- DCCV 06/03/14   NSVT (nonsustained ventricular tachycardia)   NSTEMI- Troponin 8   Chronic renal insufficiency, stage III (moderate)   CAD-single vessel (RCA) in 2012   Warfarin anticoagulation   On amiodarone therapy  80 year old with severe CAD, non surgical candidate, EF 25%, AFIB, Pacer/ICD, Parkinson's, NSTEMI.  CAD - progression of circ to 99%, LAD 90% mid - worsening of EF from 45 to 25%  - Non surgical candidate (Dr. Cyndia Bent) - Plan high risk PCI today Dr. Tamala Julian  - Currently on IV heparin  Both ischemic and non ischemic cardiomyopathy - low dose Bb and ACE-I because of hypotension - EF 25% - Creat has been stable around 1.11 for the last several days after diuresis and control of ventricular rate from     AFIB to NSR. - Holding lasix for cath PCI today.   ICD - stable, pacing noted  AFIB - parox - AMIO with cardioversion recently  - Coumadin on hold.  INR 1.37 - Will need triple therapy (ASA, PLAVIX, COUMADIN) post PCI. Increased bleeding risk.   NSTEMI - Trop peak 8 - CAD as above  Hypotension  Bp stable around 107/49   LOS: 11 days    HAGER, BRYAN PA-C 06/12/2014 7:13 AM  Agree with above.  In cath lab this morning.   Candee Furbish, MD

## 2014-06-12 NOTE — Progress Notes (Addendum)
Left femoral Impella site is soft and not oozing, now 2 hours after sheath removal. No chest pain or dyspnea.

## 2014-06-12 NOTE — Progress Notes (Signed)
1500 Pt sleeping. Talked with pt's RN. Will follow up Saturday for ambulation and education. Graylon Good RN BSN 06/12/2014 3:01 PM

## 2014-06-12 NOTE — H&P (View-Only) (Signed)
The patient has had a quiet night. Laboratory data is unremarkable. He denies angina. He is able lie flat without dyspnea. Bilateral femoral pulses are 2+ and symmetric.  The patient is a 5 high-risk PCI today. He will have Impella insertion in the left groin. We then plan to do two vessel PCI that will likely include rotational atherectomy followed by stenting.  The patient has severe LV dysfunction with an EF of 25%. The procedural risk is increased because of depressed LV function, heavily calcified coronaries, and multivessel intervention. He has been turned down for surgical revascularization. The patient and family are aware of the risk associated with the procedure.

## 2014-06-12 NOTE — Progress Notes (Signed)
Dr. Tamala Julian in to examine, RN informed him of fine crackles to Rt base and mid section, O2 sat 94% on RA. MD advised OK to keep NS infusion at 50 cc/hr.  BP 127/64.  Tele AV paced 60.  Heparin infusion d/c'd per request of cath lab.  Pt alert, oriented, denies pain or SOB. Pt received 600 mg Plavix last night and 81 mg ASA this AM.  Family at bedside, wallet, clothes, and dentures given to son [name] per pt's request.  Bil groins have been clipped.  DP pulses strong 2+ bil.  No edema.  Cath lab coming to get pt.  Consent on chart, signed by daughter per pt request.  All questions answered.

## 2014-06-12 NOTE — Progress Notes (Signed)
UR completed Lilton Pare K. Brocha Gilliam, RN, BSN, MSHL, CCM  06/12/2014 11:00 AM

## 2014-06-13 DIAGNOSIS — I429 Cardiomyopathy, unspecified: Secondary | ICD-10-CM

## 2014-06-13 DIAGNOSIS — I25110 Atherosclerotic heart disease of native coronary artery with unstable angina pectoris: Secondary | ICD-10-CM

## 2014-06-13 DIAGNOSIS — Z79899 Other long term (current) drug therapy: Secondary | ICD-10-CM

## 2014-06-13 LAB — PROTIME-INR
INR: 1.32 (ref 0.00–1.49)
PROTHROMBIN TIME: 16.5 s — AB (ref 11.6–15.2)

## 2014-06-13 LAB — BASIC METABOLIC PANEL
Anion gap: 5 (ref 5–15)
BUN: 15 mg/dL (ref 6–23)
CHLORIDE: 101 meq/L (ref 96–112)
CO2: 29 mmol/L (ref 19–32)
CREATININE: 1.1 mg/dL (ref 0.50–1.35)
Calcium: 8.7 mg/dL (ref 8.4–10.5)
GFR calc Af Amer: 72 mL/min — ABNORMAL LOW (ref >90)
GFR calc non Af Amer: 62 mL/min — ABNORMAL LOW (ref >90)
Glucose, Bld: 92 mg/dL (ref 70–99)
Potassium: 4.5 mmol/L (ref 3.5–5.1)
Sodium: 135 mmol/L (ref 135–145)

## 2014-06-13 LAB — CBC
HEMATOCRIT: 36.8 % — AB (ref 39.0–52.0)
HEMOGLOBIN: 11.6 g/dL — AB (ref 13.0–17.0)
MCH: 27.4 pg (ref 26.0–34.0)
MCHC: 31.5 g/dL (ref 30.0–36.0)
MCV: 87 fL (ref 78.0–100.0)
Platelets: 210 10*3/uL (ref 150–400)
RBC: 4.23 MIL/uL (ref 4.22–5.81)
RDW: 13.5 % (ref 11.5–15.5)
WBC: 5.2 10*3/uL (ref 4.0–10.5)

## 2014-06-13 LAB — BRAIN NATRIURETIC PEPTIDE: B Natriuretic Peptide: 740.2 pg/mL — ABNORMAL HIGH (ref 0.0–100.0)

## 2014-06-13 LAB — PLATELET INHIBITION P2Y12: Platelet Function  P2Y12: 267 [PRU] (ref 194–418)

## 2014-06-13 LAB — POCT ACTIVATED CLOTTING TIME: Activated Clotting Time: 380 seconds

## 2014-06-13 MED ORDER — TICAGRELOR 90 MG PO TABS
180.0000 mg | ORAL_TABLET | Freq: Once | ORAL | Status: AC
  Administered 2014-06-13: 180 mg via ORAL
  Filled 2014-06-13: qty 2

## 2014-06-13 MED ORDER — TRAZODONE HCL 50 MG PO TABS
50.0000 mg | ORAL_TABLET | Freq: Every day | ORAL | Status: DC
  Administered 2014-06-13 – 2014-06-14 (×2): 50 mg via ORAL
  Filled 2014-06-13 (×4): qty 1

## 2014-06-13 MED ORDER — PANTOPRAZOLE SODIUM 40 MG PO TBEC
40.0000 mg | DELAYED_RELEASE_TABLET | Freq: Every day | ORAL | Status: DC
  Administered 2014-06-13 – 2014-06-15 (×3): 40 mg via ORAL
  Filled 2014-06-13 (×2): qty 1

## 2014-06-13 MED ORDER — TICAGRELOR 90 MG PO TABS
90.0000 mg | ORAL_TABLET | Freq: Two times a day (BID) | ORAL | Status: DC
  Administered 2014-06-13 – 2014-06-15 (×4): 90 mg via ORAL
  Filled 2014-06-13 (×5): qty 1

## 2014-06-13 MED ORDER — MAGNESIUM HYDROXIDE 400 MG/5ML PO SUSP
30.0000 mL | Freq: Every day | ORAL | Status: DC | PRN
  Administered 2014-06-13: 30 mL via ORAL
  Filled 2014-06-13: qty 30

## 2014-06-13 NOTE — Progress Notes (Signed)
Physical; therapy called and asked to round on patient. Physical therapy  stated that due to limited holiday staff, they would not be able to ambulate with the patient until tomorrow; but would see if anybody had time to see him today.  RN Ambulated in hallway with patient. Patient ambulated around entire unit x1. PT stated that he could ambulate even further, but needed to get back to the room to use the bathroom. Pt denied any dyspnea or SOB. SATS 90-95% during ambulation on room air. Patient ambulated using a rolling wheelchair for support. Pt does have an unsteady gait. High fall risk.  Patient states that he lives alone, in my opinion pt would benefit from HH/PT & 24-hour supervision. Physical therapy to follow up with patient for recommendation. Roxan Hockey, RN

## 2014-06-13 NOTE — Progress Notes (Addendum)
Patient Name: Thomas [name] [facility] Date of Encounter: 06/13/2014    SUBJECTIVE: Patient is doing well this morning. His sitting in a chair at the bedside. He denies dyspnea and chest pain. He requests being discharged home. He denies groin discomfort. He's here seeking go home and stay with his daughter for a while.  TELEMETRY:  Paced rhythm at 60 bpm. Filed Vitals:   06/13/14 0400 06/13/14 0500 06/13/14 0600 06/13/14 0700  BP: 129/57 116/62 123/51 106/30  Pulse: 59 58 62 59  Temp:      TempSrc:      Resp: 11 14 15 17   Height:      Weight:  197 lb 12 oz (89.7 kg)    SpO2: 100% 100% 99% 99%    Intake/Output Summary (Last 24 hours) at 06/13/14 0857 Last data filed at 06/13/14 0500  Gross per 24 hour  Intake    515 ml  Output   1650 ml  Net  -1135 ml   LABS: Basic Metabolic Panel:  Recent Labs  06/12/14 0400 06/13/14 0315  NA 137 135  K 4.4 4.5  CL 104 101  CO2 28 29  GLUCOSE 103* 92  BUN 17 15  CREATININE 1.11 1.10  CALCIUM 8.4 8.7   CBC:  Recent Labs  06/12/14 0400 06/13/14 0315  WBC 4.6 5.2  HGB 10.4* 11.6*  HCT 32.8* 36.8*  MCV 84.3 87.0  PLT 210 210   BNP    Component Value Date/Time   PROBNP 320.0* 10/05/2010 2320    Radiology/Studies:  No new data  Physical Exam: Blood pressure 106/30, pulse 59, temperature 97.5 F (36.4 C), temperature source Oral, resp. rate 17, height 6' (1.829 m), weight 197 lb 12 oz (89.7 kg), SpO2 99 %. Weight change: -1 lb 12.2 oz (-0.8 kg)  Wt Readings from Last 3 Encounters:  06/13/14 197 lb 12 oz (89.7 kg)  10/13/11 232 lb (105.235 kg)  07/12/11 228 lb (103.42 kg)  HEENT exam is unremarkable. Skin tone is normal. Chest is clear in the bases Cardiac exam reveals no murmur or rub Extremities reveal no evidence of groin hematoma in either the left (Impella) or right groin access sites. Neuro exam is unremarkable  ASSESSMENT:  1. Mixed ischemic and nonischemic cardiomyopathy with chronic systolic  heart failure without clinical evidence of acute component this morning. 2. Native calcified CAD, Status post high risk multivessel PCI of LAD and circumflex with good angiographic result after drug-eluting stent implantation. 3. Atrial fibrillation, now maintaining sinus rhythm after cardioversion. With high CHADS score, will need chronic anticoagulation. 4. Amiodarone therapy for maintenance of sinus rhythm 5. Nonresponder Plavix 6. Recent non-ST elevation myocardial infarction  Plan:  1. PT assessment 2. Will switch to Brilinta since he is a nonresponder to Plavix 3. Since he is maintaining sinus rhythm, I have chosen not to anticoagulate the patient with Coumadin to minimize the risk of bleeding given his frailty and difficulty with Parkinson's disease. If he develops recurrent atrial fibrillation on amiodarone, addition of Coumadin will be necessary and perhaps aspirin can be discontinued. 4. Will need a thirty-day continuous monitor to exclude asymptomatic atrial fibrillation 5. If he is able to ambulate and home situation is felt safe, he will be eligible for discharge over the weekend. 6. Will transfer out of the coronary care unit once he ambulates with Cardiac rehabilitation/PT. 7. He is 13000 cc negative since admission. Will need diuretic restarted, perhaps at a lower dose, prior to  discharge.  Thomas [name] 06/13/2014, 8:57 AM

## 2014-06-13 NOTE — Progress Notes (Signed)
Patient transferred to [place] room 27 per order. VS stable. Patient in chair, no complaints.  Patient off oxygen and on room air. SATS 98-100%.   Patient ambulated to new room. This completes 3rd walk for today.   Roxan Hockey, RN

## 2014-06-13 NOTE — Progress Notes (Signed)
Patient ambulated around unit x1, 1 assist, using rolling wheelchair for support. SATS 90& and above. Denied SOB. Tolerated walk well. This completes 2nd walk today.  Roxan Hockey, RN

## 2014-06-14 LAB — CBC
HEMATOCRIT: 34.6 % — AB (ref 39.0–52.0)
HEMOGLOBIN: 11.1 g/dL — AB (ref 13.0–17.0)
MCH: 27.8 pg (ref 26.0–34.0)
MCHC: 32.1 g/dL (ref 30.0–36.0)
MCV: 86.5 fL (ref 78.0–100.0)
PLATELETS: 193 10*3/uL (ref 150–400)
RBC: 4 MIL/uL — ABNORMAL LOW (ref 4.22–5.81)
RDW: 13.5 % (ref 11.5–15.5)
WBC: 6.1 10*3/uL (ref 4.0–10.5)

## 2014-06-14 LAB — PROTIME-INR
INR: 1.31 (ref 0.00–1.49)
Prothrombin Time: 16.4 seconds — ABNORMAL HIGH (ref 11.6–15.2)

## 2014-06-14 MED ORDER — ZOLPIDEM TARTRATE 5 MG PO TABS
5.0000 mg | ORAL_TABLET | Freq: Every evening | ORAL | Status: DC | PRN
  Administered 2014-06-14: 5 mg via ORAL
  Filled 2014-06-14: qty 1

## 2014-06-14 NOTE — Progress Notes (Signed)
Physical Therapy Treatment/Re-evaluation Patient Details Name: Thomas [name] MRN: [id number] DOB: Apr 23, 1934 Today's Date: 06/14/2014    History of Present Illness 81 y.o. male with a history of CAD and mostly non-ischemic cardiomypathy, s/p CRT-D, parkinsons disease, who presents with 1 day of SOB.  He presented to [facility] ED, and was found to be in AFib with RVR and with an elevated troponin of 5.  Pt s/p cardiac cath with stent placement and LVAD insertion on 06/12/14.     PT Comments    This pt is highly motivated to get moving and regain his strength so he can return home.  He continues to be mildly unsteady on his feet, and I recommended to him that he use a RW for a week or two while working with home therapy to build his strength and balance back up to baseline.  He is safe to go home with friends and family's assist at discharge.  PT will continue to follow acutely to work on gait, balance, and exercises to improve his strength.  Vitals stable throughout walking.  No DOE.     Follow Up Recommendations  Home health PT;Supervision/Assistance - 24 hour     Equipment Recommendations  Rolling walker with 5" wheels    Recommendations for Other Services   NA     Precautions / Restrictions Precautions Precautions: Fall Precaution Comments: pt reports h/o falls    Mobility           Transfers Overall transfer level: Needs assistance Equipment used: Rolling walker (2 wheeled) Transfers: Sit to/from Stand Sit to Stand: Supervision         General transfer comment: Supervision for safety during transitions as pt is heavily reliant on his upper extremities to get to standing and has a preference towards posterior leaning.   Ambulation/Gait Ambulation/Gait assistance: Min guard;Min assist Ambulation Distance (Feet): 550 Feet Assistive device: Rolling walker (2 wheeled);None Gait Pattern/deviations: Step-through pattern;Shuffle (anterior lean) Gait velocity:  decreased Gait velocity interpretation: Below normal speed for age/gender General Gait Details: Pt with Parkinsonian gait pattern, short shuffling steps initally with increased stride length as we go.  He also has a preference (without the RW) for keeping his chest anterior of his feet (COG anterior of BOS).  Pt reports he did not use an AD PTA, so we walked one lap with RW and one lap without RW. He needed more physical assist without RW , especially when turning corners.  At this time he would be safer to use RW at home until his strength returns.           Balance Overall balance assessment: Needs assistance Sitting-balance support: Feet supported;No upper extremity supported Sitting balance-Leahy Scale: Good     Standing balance support: No upper extremity supported;Bilateral upper extremity supported;Single extremity supported Standing balance-Leahy Scale: Good Standing balance comment: Balance deficits are evident with dynamic activites and he tends to "furniture walk" in room where he can reach stabilizing surfaces.                     Cognition Arousal/Alertness: Awake/alert Behavior During Therapy: Flat affect Overall Cognitive Status: No family/caregiver present to determine baseline cognitive functioning                      Exercises General Exercises - Lower Extremity Hip ABduction/ADduction: AROM;Both;10 reps;Standing Hip Flexion/Marching: AROM;Both;10 reps;Standing Toe Raises: AROM;Both;10 reps;Standing Heel Raises: AROM;Both;10 reps;Standing        Pertinent Vitals/Pain Pain  Assessment: No/denies pain           PT Goals (current goals can now be found in the care plan section) Acute Rehab PT Goals Patient Stated Goal: to go home PT Goal Formulation: With patient/family Time For Goal Achievement: 06/28/14 Potential to Achieve Goals: Good Progress towards PT goals:  (re- evaluation completed.)    Frequency  Min 3X/week    PT Plan  Current plan remains appropriate       End of Session Equipment Utilized During Treatment: Gait belt Activity Tolerance: Patient tolerated treatment well Patient left: in chair;with call bell/phone within reach     Time: 0851-0913 PT Time Calculation (min) (ACUTE ONLY): 22 min  Charges:    1 re eval           Vahe Pienta B. Kenyon Eichelberger, PT, DPT [phone]   06/14/2014, 9:29 AM

## 2014-06-14 NOTE — Progress Notes (Signed)
Patient ID: Thomas [name], male   DOB: September 25, 1933, 81 y.o.   MRN: [id number]       Patient Name: Thomas [name] Date of Encounter: 06/14/2014    SUBJECTIVE: Wants to go home  Ambulated with PT today  Condom catheter on Using walker   TELEMETRY:  Paced rhythm at 60 bpm. Filed Vitals:   06/14/14 0800 06/14/14 0813 06/14/14 0851 06/14/14 0917  BP: 111/54 111/54 89/34 102/37  Pulse: 60 59 60 63  Temp:  97.5 F (36.4 C)    TempSrc:  Oral    Resp: 15 20    Height:      Weight:      SpO2: 95% 96% 99% 98%    Intake/Output Summary (Last 24 hours) at 06/14/14 1137 Last data filed at 06/14/14 0400  Gross per 24 hour  Intake    440 ml  Output   2150 ml  Net  -1710 ml   LABS: Basic Metabolic Panel:  Recent Labs  06/12/14 0400 06/13/14 0315  NA 137 135  K 4.4 4.5  CL 104 101  CO2 28 29  GLUCOSE 103* 92  BUN 17 15  CREATININE 1.11 1.10  CALCIUM 8.4 8.7   CBC:  Recent Labs  06/13/14 0315 06/14/14 0222  WBC 5.2 6.1  HGB 11.6* 11.1*  HCT 36.8* 34.6*  MCV 87.0 86.5  PLT 210 193   BNP    Component Value Date/Time   PROBNP 320.0* 10/05/2010 2320    Radiology/Studies:  No new data  Physical Exam: Blood pressure 102/37, pulse 63, temperature 97.5 F (36.4 C), temperature source Oral, resp. rate 20, height 6' (1.829 m), weight 91.3 kg (201 lb 4.5 oz), SpO2 98 %. Weight change: -2.2 kg (-4 lb 13.6 oz)  Wt Readings from Last 3 Encounters:  06/14/14 91.3 kg (201 lb 4.5 oz)  10/13/11 105.235 kg (232 lb)  07/12/11 103.42 kg (228 lb)  HEENT exam is unremarkable. Skin tone is normal. Chest is clear in the bases Cardiac exam reveals no murmur or rub Extremities reveal no evidence of groin hematoma in either the left (Impella) or right groin access sites. Neuro exam is unremarkable  ASSESSMENT:  1. Mixed ischemic and nonischemic cardiomyopathy with chronic systolic heart failure without clinical evidence of acute component this morning. 2. Native  calcified CAD, Status post high risk multivessel PCI of LAD and circumflex with good angiographic result after drug-eluting stent implantation. 3. Atrial fibrillation, now maintaining sinus rhythm after cardioversion. No coumadin per HS bleeding risk on triple Rx  4. Amiodarone therapy for maintenance of sinus rhythm 5. Nonresponder Plavix 6. Recent non-ST elevation myocardial infarction  Plan:  Transfer to telemetry Continue Brillinta Plavix non responder  No coumadin continue amiodarone AV pacing now  Would send home on lasix 20 mg instead of 40 mg and he has PRN demedex  D/C in am PA;s notified   Rozann Lesches 06/14/2014, 11:37 AM

## 2014-06-14 NOTE — Significant Event (Signed)
Patient transferred from [name]. VSS a&ox4, denies pain or discomfort at this time . Placed on tele box 3e17 CCMD called.

## 2014-06-14 NOTE — Progress Notes (Signed)
CARDIAC REHAB PHASE I   PRE:  Rate/Rhythm: 60 paced  BP:  Supine:   Sitting: 88/43  Standing:    SaO2: 100%RA  MODE:  Ambulation: 350 ft   POST:  Rate/Rhythm: 65 paced  BP:  Supine:   Sitting: 113/48  Standing:    SaO2: would not register 1305-1400 Put pt's shoes on at his request prior to walk. Pt walked 350 ft with rolling walker and asst x 1 with some unsteadiness. Pt stated he did not have walker at home so he will need one as PT has recommended that he walk with it for next few weeks. Pt has tendency to get feet outside of walker. To recliner after walk. Call bell in reach. No dizziness with low BP. Put brilinta booklet and CHF booklet and low sodium diets in room and discussed with RN and pt to encourage family to read. Discussed with pt that he has to take brilinta for stent. Encouraged daily weights and watching sodium. Pt declined CRP 2 at this time. Encouraged pt to walk with family and walker when up.   Graylon Good, RN BSN  06/14/2014 1:56 PM

## 2014-06-15 ENCOUNTER — Other Ambulatory Visit: Payer: Self-pay | Admitting: Physician Assistant

## 2014-06-15 DIAGNOSIS — I480 Paroxysmal atrial fibrillation: Secondary | ICD-10-CM

## 2014-06-15 DIAGNOSIS — G20 Parkinson's disease: Secondary | ICD-10-CM

## 2014-06-15 DIAGNOSIS — I447 Left bundle-branch block, unspecified: Secondary | ICD-10-CM

## 2014-06-15 DIAGNOSIS — N189 Chronic kidney disease, unspecified: Secondary | ICD-10-CM

## 2014-06-15 DIAGNOSIS — E785 Hyperlipidemia, unspecified: Secondary | ICD-10-CM

## 2014-06-15 LAB — CBC
HCT: 32.8 % — ABNORMAL LOW (ref 39.0–52.0)
HEMOGLOBIN: 10.5 g/dL — AB (ref 13.0–17.0)
MCH: 27.1 pg (ref 26.0–34.0)
MCHC: 32 g/dL (ref 30.0–36.0)
MCV: 84.5 fL (ref 78.0–100.0)
PLATELETS: 192 10*3/uL (ref 150–400)
RBC: 3.88 MIL/uL — AB (ref 4.22–5.81)
RDW: 13.8 % (ref 11.5–15.5)
WBC: 5.6 10*3/uL (ref 4.0–10.5)

## 2014-06-15 LAB — PROTIME-INR
INR: 1.23 (ref 0.00–1.49)
PROTHROMBIN TIME: 15.6 s — AB (ref 11.6–15.2)

## 2014-06-15 MED ORDER — FUROSEMIDE 20 MG PO TABS: 20.0000 mg | ORAL_TABLET | Freq: Every day | ORAL | Status: DC

## 2014-06-15 MED ORDER — TICAGRELOR 90 MG PO TABS: 90.0000 mg | ORAL_TABLET | Freq: Two times a day (BID) | ORAL | Status: DC

## 2014-06-15 MED ORDER — METOPROLOL SUCCINATE 12.5 MG HALF TABLET: 12.5000 mg | ORAL_TABLET | Freq: Every day | ORAL | Status: DC

## 2014-06-15 MED ORDER — AMIODARONE HCL 200 MG PO TABS: 200.0000 mg | ORAL_TABLET | Freq: Two times a day (BID) | ORAL | Status: DC

## 2014-06-15 MED ORDER — ASPIRIN 81 MG PO TBEC
81.0000 mg | DELAYED_RELEASE_TABLET | Freq: Every day | ORAL | Status: AC
Start: 2014-06-15 — End: ?

## 2014-06-15 MED ORDER — LISINOPRIL 2.5 MG PO TABS: 2.5000 mg | ORAL_TABLET | Freq: Every day | ORAL | Status: DC

## 2014-06-15 MED ORDER — PANTOPRAZOLE SODIUM 40 MG PO TBEC: 40.0000 mg | DELAYED_RELEASE_TABLET | Freq: Every day | ORAL | Status: DC

## 2014-06-15 NOTE — Progress Notes (Signed)
SUBJECTIVE: Denies chest pain, SOB, and palpitations. Wants to go home.     Intake/Output Summary (Last 24 hours) at 06/15/14 0904 Last data filed at 06/15/14 0175  Gross per 24 hour  Intake    620 ml  Output   1425 ml  Net   -805 ml    Current Facility-Administered Medications  Medication Dose Route Frequency Provider Last Rate Last Dose  . acetaminophen (TYLENOL) tablet 650 mg  650 mg Oral Q4H PRN Stephani Police, MD   650 mg at 06/15/14 1025  . amantadine (SYMMETREL) capsule 100 mg  100 mg Oral Daily Dayna N Dunn, PA-C   100 mg at 06/14/14 1034  . amiodarone (PACERONE) tablet 200 mg  200 mg Oral BID Thompson Grayer, MD   200 mg at 06/14/14 2148  . antiseptic oral rinse (CPC / CETYLPYRIDINIUM CHLORIDE 0.05%) solution 7 mL  7 mL Mouth Rinse q12n4p Dorothy Spark, MD   7 mL at 06/14/14 1600  . aspirin EC tablet 81 mg  81 mg Oral Daily Dorothy Spark, MD   81 mg at 06/14/14 1034  . atorvastatin (LIPITOR) tablet 40 mg  40 mg Oral q1800 Lelon Perla, MD   40 mg at 06/14/14 1753  . DULoxetine (CYMBALTA) DR capsule 30 mg  30 mg Oral Daily Dayna N Dunn, PA-C   30 mg at 06/14/14 1035  . lisinopril (PRINIVIL,ZESTRIL) tablet 2.5 mg  2.5 mg Oral Daily Erlene Quan, PA-C   2.5 mg at 06/14/14 1034  . magnesium hydroxide (MILK OF MAGNESIA) suspension 30 mL  30 mL Oral Daily PRN Darlin Coco, MD   30 mL at 06/13/14 1408  . metoprolol succinate (TOPROL-XL) 24 hr tablet 12.5 mg  12.5 mg Oral Daily Lelon Perla, MD   12.5 mg at 06/14/14 1035  . morphine 4 MG/ML injection 3 mg  3 mg Intravenous Q2H PRN Lamar Sprinkles, MD   3 mg at 06/10/14 0301  . ondansetron (ZOFRAN) injection 4 mg  4 mg Intravenous Q6H PRN Stephani Police, MD      . oxyCODONE-acetaminophen (PERCOCET/ROXICET) 5-325 MG per tablet 1-2 tablet  1-2 tablet Oral Q4H PRN Sinclair Grooms, MD   2 tablet at 06/12/14 2141  . pantoprazole (PROTONIX) EC tablet 40 mg  40 mg Oral Daily Darlin Coco, MD   40 mg at 06/14/14  1034  . rotigotine (NEUPRO) 4 MG/24HR 1 patch  1 patch Transdermal Daily Arty Baumgartner, [facility]   1 patch at 06/14/14 1034   And  . rotigotine (NEUPRO) 2 MG/24HR 1 patch  1 patch Transdermal Daily Arty Baumgartner, [facility]   1 patch at 06/14/14 1035  . ticagrelor (BRILINTA) tablet 90 mg  90 mg Oral BID Belva Crome III, MD   90 mg at 06/14/14 2102  . traZODone (DESYREL) tablet 50 mg  50 mg Oral QHS Janora Norlander, MD   50 mg at 06/14/14 2102  . zolpidem (AMBIEN) tablet 5 mg  5 mg Oral QHS PRN Sueanne Margarita, MD   5 mg at 06/14/14 0231    Filed Vitals:   06/14/14 2000 06/15/14 0017 06/15/14 0400 06/15/14 0748  BP: 99/46 102/53 88/40 110/53  Pulse: 60 60 59   Temp: 99.5 F (37.5 C) 98.6 F (37 C) 98.4 F (36.9 C)   TempSrc:      Resp: 18 16 16    Height:      Weight:   194 lb  12.8 oz (88.361 kg)   SpO2: 98% 96% 96%     PHYSICAL EXAM General: NAD HEENT: Normal. Neck: No JVD, no thyromegaly.  Lungs: Clear to auscultation bilaterally with normal respiratory effort. CV: Nondisplaced PMI.  Regular rate and rhythm, normal S1/S2, no S3/S4, no murmur.  No pretibial edema.  No carotid bruit.  Normal pedal pulses.  Abdomen: Soft, nontender, no hepatosplenomegaly, no distention.  Neurologic: Alert and oriented x 3.  Psych: Normal affect. Musculoskeletal: Normal range of motion. No gross deformities. Extremities: No clubbing or cyanosis.   TELEMETRY: Reviewed telemetry pt in AV paced rhythm.  LABS: Basic Metabolic Panel:  Recent Labs  06/13/14 0315  NA 135  K 4.5  CL 101  CO2 29  GLUCOSE 92  BUN 15  CREATININE 1.10  CALCIUM 8.7   Liver Function Tests: No results for input(s): AST, ALT, ALKPHOS, BILITOT, PROT, ALBUMIN in the last 72 hours. No results for input(s): LIPASE, AMYLASE in the last 72 hours. CBC:  Recent Labs  06/14/14 0222 06/15/14 0357  WBC 6.1 5.6  HGB 11.1* 10.5*  HCT 34.6* 32.8*  MCV 86.5 84.5  PLT 193 192   Cardiac Enzymes: No results for  input(s): CKTOTAL, CKMB, CKMBINDEX, TROPONINI in the last 72 hours. BNP: Invalid input(s): POCBNP D-Dimer: No results for input(s): DDIMER in the last 72 hours. Hemoglobin A1C: No results for input(s): HGBA1C in the last 72 hours. Fasting Lipid Panel: No results for input(s): CHOL, HDL, LDLCALC, TRIG, CHOLHDL, LDLDIRECT in the last 72 hours. Thyroid Function Tests: No results for input(s): TSH, T4TOTAL, T3FREE, THYROIDAB in the last 72 hours.  Invalid input(s): FREET3 Anemia Panel: No results for input(s): VITAMINB12, FOLATE, FERRITIN, TIBC, IRON, RETICCTPCT in the last 72 hours.  RADIOLOGY: Dg Chest Port 1 View  06/06/2014   CLINICAL DATA:  Hypertension. Coronary artery disease. Congestive heart failure.  EXAM: PORTABLE CHEST - 1 VIEW  COMPARISON:  06/04/2014  FINDINGS: Improved but not resolved bilateral perihilar airspace opacities. The patient is rotated to the Right on today's radiograph, reducing diagnostic sensitivity and specificity. Biapical pleural parenchymal scarring. Bilateral interstitial accentuation. Continued silhouetting of the left hemidiaphragm.  Considerable enlargement of the cardiopericardial silhouette. AICD in place. Thoracic spondylosis.  IMPRESSION: 1. Mild improvement in pulmonary edema.   Electronically Signed   By: Sherryl Barters M.D.   On: 06/06/2014 08:52   Dg Chest Port 1 View  06/04/2014   CLINICAL DATA:  Sudden onset shortness of breath  EXAM: PORTABLE CHEST - 1 VIEW  COMPARISON:  06/02/2014  FINDINGS: Worsening perihilar airspace opacities suggest progressive edema. Small pleural effusions partly visualized. Cardiomegaly reidentified. Left AICD in place.  IMPRESSION: Progressive probable pulmonary edema.   Electronically Signed   By: Conchita Paris M.D.   On: 06/04/2014 02:52   Dg Chest Port 1 View  06/02/2014   CLINICAL DATA:  Congestive heart failure.  EXAM: PORTABLE CHEST - 1 VIEW  COMPARISON:  02/08/2011.  10/06/2010.  FINDINGS: Cardiac pacer in  stable position. Severe stable cardiomegaly. Pulmonary venous congestion interstitial prominence suggest a component congestive heart failure. Underlying pneumonitis and/or chronic interstitial lung disease cannot be excluded. No acute bony abnormality.  IMPRESSION: 1. Severe cardiomegaly. Pulmonary venous congestion and interstitial prominence suggesting congestive heart failure. A component of pneumonitis and/or chronic interstitial lung disease cannot be excluded. 2. Cardiac pacer in stable position.   Electronically Signed   By: Marcello Moores  Register   On: 06/02/2014 07:17      ASSESSMENT AND PLAN: 1. Mixed  ischemic and nonischemic cardiomyopathy with acute on chronic systolic heart failure, EF 25-30%: Euvolemic and stable. 1.2 L output in last 24 hours. Will d/c on 20 mg Lasix daily and prn torsemide. 2. Native calcified CAD, status post high risk multivessel PCI of LAD and circumflex with good angiographic result after drug-eluting stent implantation for recent NSTEMI: Continue ASA and Brilinta, as he is a Plavix nonresponder. 3. Atrial fibrillation, now maintaining sinus rhythm after cardioversion: No warfarin per Dr. Tamala Julian due to bleeding risk on triple Rx. Recommendation for continued amiodarone and outpatient 30-day event monitor to exclude asymptomatic atrial fibrillation. If he develops recurrent atrial fibrillation, ASA could be discontinued and warfarin could be initiated. 4. Hyperlipidemia: Continue Lipitor. 5. CRT-D: Follow up with EP.  Kate Sable, M.D., F.A.C.C.

## 2014-06-15 NOTE — Discharge Summary (Signed)
Discharge Summary   Patient ID: Thomas [name],  MRN: [id number], DOB/AGE: Feb 22, 1934 81 y.o.  Admit date: 06/01/2014 Discharge date: 06/15/2014  Primary Care Provider: No primary care provider on file. Primary Cardiologist: Previously seen by Dr. Tennis Must Gent/Dr. Caryl Comes, live in [place]  Discharge Diagnoses Principal Problem:   Acute systolic congestive heart failure Active Problems:   LBBB (left bundle branch block)   Non-ischemic cardiomyopathy-EF 25-30%   MDT BiV ICD 2012   Parkinson's disease   Atrial fibrillation with RVR- DCCV 06/03/14   NSVT (nonsustained ventricular tachycardia)   NSTEMI- Troponin 8   Chronic renal insufficiency, stage III (moderate)   CAD-single vessel (RCA) in 2012   Warfarin anticoagulation   On amiodarone therapy   Acute on chronic systolic congestive heart failure   Allergies No Known Allergies  Procedures  Echocardiogram 06/02/2014 LV EF: 25% -  30%  ------------------------------------------------------------------- Indications:   CHF - 428.0.  ------------------------------------------------------------------- History:  PMH:  Atrial fibrillation. Coronary artery disease. Cardiomyopathy. Risk factors: Former tobacco use.  ------------------------------------------------------------------- Study Conclusions  - Left ventricle: The cavity size was normal. Wall thickness was increased in a pattern of mild LVH. There was mild focal basal hypertrophy of the septum. Systolic function was severely reduced. The estimated ejection fraction was in the range of 25% to 30%. Diffuse hypokinesis. - Mitral valve: Calcified annulus. There was mild regurgitation. - Left atrium: The atrium was moderately dilated. - Pulmonary arteries: Systolic pressure was mildly increased. PA peak pressure: 39 mm Hg (S).  Impressions:  - Severe global reduction in LV function; moderate LAE; mild MR; mildly elevated pulmonary pressures.      Cardiac catheterization 06/10/2014 HEMODYNAMICS: Aortic pressure 95/50 mmHg; LV pressure 101/8 mmHg; LVEDP 15 mmHg  LV EF: 25% -  30%  ------------------------------------------------------------------- Indications:   CHF - 428.0.  ------------------------------------------------------------------- History:  PMH:  Atrial fibrillation. Coronary artery disease. Cardiomyopathy. Risk factors: Former tobacco use.  ------------------------------------------------------------------- Study Conclusions  - Left ventricle: The cavity size was normal. Wall thickness was increased in a pattern of mild LVH. There was mild focal basal hypertrophy of the septum. Systolic function was severely reduced. The estimated ejection fraction was in the range of 25% to 30%. Diffuse hypokinesis. - Mitral valve: Calcified annulus. There was mild regurgitation. - Left atrium: The atrium was moderately dilated. - Pulmonary arteries: Systolic pressure was mildly increased. PA peak pressure: 39 mm Hg (S).  Impressions:  - Severe global reduction in LV function; moderate LAE; mild MR; mildly elevated pulmonary pressures.   Cardiac catheterization 06/12/2014 IMPRESSIONS: 1. High risk multivessel PCI in setting of severe left ventricular systolic dysfunction performed using left ventricular assist device, Impella. 2. Successful PTCA and DE stent in the mid circumflex reducing a 99% stenosis to less than 10% with TIMI grade 3 flow. 3. Successful rotational atherectomy and drug-eluting stent implantation in the mid LAD reducing a 90% segmental stenosis to 0% with TIMI grade 3 flow 4. Severe mixed ischemic and nonischemic cardiomyopathy with EF less than 25%.   RECOMMENDATION: 1.Remove the left femoral sheath later today once the ACT is less than 175 seconds. 2. Aspirin, Plavix, and Coumadin 3. P2 Y12 in a.m. If less than 230 no action is needed. If greater than 230, we will need to  switch to Brilinta and discontinue Coumadin. 4. INR does not need to be therapeutic prior to discharge. We'll need to have very close and early follow-up after discharge with reference to anticoagulation. He cannot be discharged until we are certain that  close follow-up as possible. 5. PT eval to determine home needs. 6. Anticipate discharge over the weekend. May need short term assisted living arrangement as he lives at home alone. 7. Resume and optimize heart failure therapy. No heparin due to left goin access for Impella and risk of bleeding.   Hospital Course  A call is a 81 year old male with history of coronary artery disease, mostly nonischemic cardiomyopathy, status post CRT-D, Parkinson's disease who presented to [facility] on 06/01/2014 with one day onset of shortness of breath. Initially presented to [facility] ED and was found to be in atrial fibrillation with RVR and elevated troponin of 5. He was subsequently transferred to [facility] for further evaluation. On arrival, it was felt he was in acute congestive heart failure and was given IV Lasix. Amiodarone drip has been started for atrial fibrillation with RVR with underlying left bundle branch block. He was therapeutic on Coumadin on arrival.  He was seen on the following day at which time he was hypotensive, his Lasix was discontinued to preserve blood pressure. Electrophysiology was consulted for further recommendations with regard to atrial fibrillation with RVR. Echocardiogram was obtained on 06/02/2014 which showed EF 25-30%, his hypokinesis, mild LVH, PA peak pressure 39, mildly dilated left atrium. Of note, his EF back in 2013 was also 25-30%. Ejection fraction has not changed much since that time. He was seen by Dr. Caryl Comes with electrophysiology on the same day. His positive troponin was felt to be demand ischemia in the setting of his known RCA disease. He is acute on chronic heart failure temporally coincides with  onset of atrial fibrillation on 12/16 documented on defibrillator interrogation. Given the fact that patient was therapeutic on arrival, cardioversion was recommended without the need for TEE. Interrogation also noted polymorphic ventricular tachycardia, Myoview was recommended for risk stratification as outpatient.   He eventually underwent cardioversion on 12/22 after single synchronized biphasic's 200 J shock. Patient was seen by Dr. Ron Parker in the morning of 06/06/2014, at which time it was felt he is worsening CHF may be ischemic in origin. Cardiac catheterization was recommended at the time. His Coumadin was held and he was placed on heparin bridge. He underwent cardiac catheterization on 06/10/2014 which showed EF 25%, eccentric proximal LAD 50-70% stenosis, 90% mid LAD stenosis, heavily calcified left circumflex with 99% proximal to mid vessel stenosis. Patient had severe three-vessel coronary artery disease, CT surgery was consulted for potential multivessel coronary artery bypass grafting versus high-risk multivessel PCI with LV support exam intact. Patient was seen by CT surgery Dr. Cyndia Bent in the afternoon of 12/29 who reviewed the cath film, after discussing with the patient regarding his comorbidities including Parkinson's disease but was felt he will have a very difficult time recovering from surgery and surgery would not improve his ejection fraction. According to Dr. Cyndia Bent, PCI would be a better option for him. Patient eventually went back to the cath lab 06/12/2014 to undergo high-risk multivessel PCI using Impala left ventricular assist device, he had successful PTCA and drug-eluting stent in mid circumflex and rotational atherectomy and drug-eluting stent placement in mid LAD. Patient was placed on aspirin and Plavix. P2Y12 shows he was a nonresponder to Plavix, he was eventually switched to Brilinta. Since he is maintaining sinus rhythm, his coumadin was not restarted to minimize the risk of  bleeding given his frailty and difficulty with Parkinson's disease. If he develops recurrent atrial fibrillation on amiodarone, the addition of Coumadin will be necessary and that  aspirin may be discontinued after one month. Patient will need to 30 day continuous monitor to exclude asymptomatic atrial fibrillation. He was resumed on 20 mg Lasix daily and PRN torsemide for diuresis prior to discharge.  Patient was seen in the morning of 06/15/2014, at which time he denies any obvious chest pain or shortness breath, he is deemed stable for discharge from cardiology perspective. I will arrange for outpatient 30 day event monitor to exclude asymptomatic atrial fibrillation. If he develops recurrent atrial fibrillation, aspirin could be discontinued and the Coumadin reinitiated. He will need to continue his outpatient follow-up with electrophysiology for CRT-D. After discussing with the son and the patient, he will likely need home health. Given his current condition, I would not recommend him to continue driving.   Discharge Vitals Blood pressure 110/53, pulse 59, temperature 98.4 F (36.9 C), temperature source Oral, resp. rate 16, height 6' (1.829 m), weight 194 lb 12.8 oz (88.361 kg), SpO2 96 %.  Filed Weights   06/14/14 0341 06/14/14 1439 06/15/14 0400  Weight: 201 lb 4.5 oz (91.3 kg) 199 lb 15.3 oz (90.7 kg) 194 lb 12.8 oz (88.361 kg)    Labs  CBC  Recent Labs  06/14/14 0222 06/15/14 0357  WBC 6.1 5.6  HGB 11.1* 10.5*  HCT 34.6* 32.8*  MCV 86.5 84.5  PLT 193 614   Basic Metabolic Panel  Recent Labs  06/13/14 0315  NA 135  K 4.5  CL 101  CO2 29  GLUCOSE 92  BUN 15  CREATININE 1.10  CALCIUM 8.7    Disposition  Pt is being discharged home today in good condition.  Follow-up Plans & Appointments      Follow-up Information    Follow up with [facility].   Why:  Office will contact you to arrange followup and 30 day event monitor.   Contact information:   [address] [place] [address]       Discharge Medications    Medication List    STOP taking these medications        metoprolol tartrate 25 MG tablet  Commonly known as:  LOPRESSOR     warfarin 3 MG tablet  Commonly known as:  COUMADIN      TAKE these medications        amantadine 100 MG capsule  Commonly known as:  SYMMETREL  Take 100 mg by mouth 2 (two) times daily.     amiodarone 200 MG tablet  Commonly known as:  PACERONE  Take 1 tablet (200 mg total) by mouth 2 (two) times daily.     aspirin 81 MG EC tablet  Take 1 tablet (81 mg total) by mouth daily.     CYMBALTA 30 MG capsule  Generic drug:  DULoxetine  Take 30 mg by mouth daily.     Fish Oil 1000 MG Caps  Take one by mouth twice daily     furosemide 20 MG tablet  Commonly known as:  LASIX  Take 1 tablet (20 mg total) by mouth daily.     ICAPS MV PO  Take 2 by mouth twice daily     lisinopril 2.5 MG tablet  Commonly known as:  PRINIVIL,ZESTRIL  Take 1 tablet (2.5 mg total) by mouth daily.     metoprolol succinate 12.5 mg Tb24 24 hr tablet  Commonly known as:  TOPROL-XL  Take 0.5 tablets (12.5 mg total) by mouth daily.     nitroGLYCERIN 0.4 MG SL tablet  Commonly known as:  NITROSTAT  Place 0.4 mg under the tongue every 5 (five) minutes as needed.     pantoprazole 40 MG tablet  Commonly known as:  PROTONIX  Take 1 tablet (40 mg total) by mouth daily.     rotigotine 6 MG/24HR  Commonly known as:  NEUPRO  Place 1 patch onto the skin daily.     simvastatin 20 MG tablet  Commonly known as:  ZOCOR  Take 1 tablet (20 mg total) by mouth at bedtime.     ticagrelor 90 MG Tabs tablet  Commonly known as:  BRILINTA  Take 1 tablet (90 mg total) by mouth 2 (two) times daily.     torsemide 100 MG tablet  Commonly known as:  DEMADEX  Take 50-100 mg by mouth daily as needed (edema).     vitamin C 500 MG tablet  Commonly known as:  ASCORBIC ACID  Take 500 mg by mouth.         Outpatient 30 day event monitor  Duration of Discharge Encounter   Greater than 30 minutes including physician time.  Hilbert Corrigan PA-C Pager: [phone] 06/15/2014, 1:08 PM

## 2014-06-16 ENCOUNTER — Telehealth: Payer: Self-pay | Admitting: Internal Medicine

## 2014-06-16 MED FILL — Sodium Chloride IV Soln 0.9%: INTRAVENOUS | Qty: 50 | Status: AC

## 2014-06-16 NOTE — Telephone Encounter (Signed)
Forwarding to Dr. Tamala Julian to address (recent PCI 12/31 by Dr. Tamala Julian)

## 2014-06-16 NOTE — Telephone Encounter (Signed)
Follow up    Pt C/O medication issue:  1. Name of Medication: warfarin   2. How are you currently taking this medication (dosage and times per day)? Yes . S/p stent   3. Are you having a reaction (difficulty breathing--STAT)? No   4. What is your medication issue? clarification on medication continue or discontinue.    [place]

## 2014-06-16 NOTE — Progress Notes (Signed)
UR completed - Retro   Keighan Amezcua K. Tomesha Sargent, RN, BSN, [place], CCM  06/16/2014 11:25 AM

## 2014-06-16 NOTE — Telephone Encounter (Signed)
New message     Pt c/o medication issue: 1. Name of Medication:toprol XL 2. How are you currently taking this medication (dosage and times per day)? Questions about dosage 3. Are you having a reaction (difficulty breathing--STAT)?  n/a 4. What is your medication issue?  Questions about dosage on this new presc

## 2014-06-16 NOTE — Telephone Encounter (Signed)
Returned call to pt's daughter [name], advised per hospital discharge documentation on 06/15/14 pt was started on Brilenta in hospital and Warfarin was discontinued.  On discharge medication list Warfarin was stopped.  Note states if afib returns may need to reinitiate Warfarin at that time.  Pt is pending placement of 30 day monitor.  Pt's daughter verbalized understanding.

## 2014-06-17 ENCOUNTER — Encounter: Payer: Self-pay | Admitting: *Deleted

## 2014-06-17 NOTE — Progress Notes (Signed)
Patient ID: Thomas [name], male   DOB: 10-24-1933, 81 y.o.   MRN: [id number] Patient enrolled for [employer] to mail a 30 day cardiac event monitor to his daughters' home. Daughter, Vito Backers, who resides at [address], [place] [address], will apply the monitor. She is the emergency contact and her phone number [phone], has been supplied to [place].

## 2014-06-18 ENCOUNTER — Telehealth: Payer: Self-pay | Admitting: Interventional Cardiology

## 2014-06-18 NOTE — Telephone Encounter (Signed)
New problem   Pt's daughter calling   Pt had procedure at two places at groin area and need to know if he can take patch off or do it need to come off on it's on. Please advise pt's daughter.

## 2014-06-18 NOTE — Telephone Encounter (Signed)
Spoke with patient's daughter (dpr on file). Pt wants to remove 2 groin bandages after caths last week (12/29 and 12/31).  She states when he removed a bandage from his arm 2 days ago he had significant bleeding and she's afraid of the same happening at groin site. Reviewed hospital/dc summary and advised to ask patient to leave the bandage another 1-2 days.  They do not have follow up scheduled yet. Daughter states patient has not had a primary cardiologist and they would prefer for Dr. Tamala Julian to follow patient. She is aware I will forward this to Dr. Tamala Julian and his primary CMA and that someone will contact her with regard to f/u appt.

## 2014-06-19 NOTE — Telephone Encounter (Signed)
correct 

## 2014-06-19 NOTE — Telephone Encounter (Signed)
Needs to be worked in next week(monday or Tuesday)

## 2014-06-20 ENCOUNTER — Encounter (INDEPENDENT_AMBULATORY_CARE_PROVIDER_SITE_OTHER): Payer: Medicare Other

## 2014-06-20 DIAGNOSIS — I480 Paroxysmal atrial fibrillation: Secondary | ICD-10-CM | POA: Diagnosis not present

## 2014-06-20 NOTE — Telephone Encounter (Signed)
Pt daughter aware. Work in appt scheduled on 06/23/14 @ 10:45am with Dr.Smith. She reports that pt is doing ok

## 2014-06-23 ENCOUNTER — Ambulatory Visit (INDEPENDENT_AMBULATORY_CARE_PROVIDER_SITE_OTHER): Payer: Medicare Other | Admitting: Interventional Cardiology

## 2014-06-23 ENCOUNTER — Encounter: Payer: Self-pay | Admitting: Interventional Cardiology

## 2014-06-23 VITALS — BP 116/62 | HR 61 | Ht 72.0 in | Wt 199.0 lb

## 2014-06-23 DIAGNOSIS — I5022 Chronic systolic (congestive) heart failure: Secondary | ICD-10-CM

## 2014-06-23 DIAGNOSIS — I429 Cardiomyopathy, unspecified: Secondary | ICD-10-CM

## 2014-06-23 DIAGNOSIS — Z95810 Presence of automatic (implantable) cardiac defibrillator: Secondary | ICD-10-CM

## 2014-06-23 DIAGNOSIS — I447 Left bundle-branch block, unspecified: Secondary | ICD-10-CM

## 2014-06-23 DIAGNOSIS — I428 Other cardiomyopathies: Secondary | ICD-10-CM

## 2014-06-23 NOTE — Patient Instructions (Signed)
Your physician recommends that you continue on your current medications as directed. Please refer to the Current Medication list given to you today.  Ok to increase your activity level  You have a follow up appointment scheduled on 08/12/14 @ 11:45am

## 2014-06-23 NOTE — Progress Notes (Signed)
Patient Thomas [name]: Thomas [name], male   DOB: October 11, 1933, 81 y.o.   MRN: [id number]    [address], [place]  [address] Phone: [phone] Fax:  [phone]  Date:  06/23/2014   Thomas [name]:  Thomas [name], DOB 1934-04-25, MRN [id number]  PCP:  No primary care provider on file.   ASSESSMENT:  1. Mixed ischemic and nonischemic cardiomyopathy, stable systolic heart failure 2. Three-vessel coronary disease with recent high risk LAD and circumflex DES 3. Paroxysmal atrial fibrillation, now with rhythm control on amiodarone 4. AICD  PLAN:  1. Liberalize physical activity 2. Clinical follow-up in 6 weeks 3. Complete 30 day monitor. If we see atrial fib we will have to resume Coumadin therapy. For the time being and I have discontinued anticoagulation as the patient is on aspirin and [place]. We cannot use Plavix because he is a nonresponder   SUBJECTIVE: Thomas [name] is a 81 y.o. male who is doing well. He denies dyspnea. He has had no palpitations or syncope. He denies lower extremity swelling. He has had no neurological complaints. No groin discomfort of the symptoms.   Wt Readings from Last 3 Encounters:  06/23/14 199 lb (90.266 kg)  06/15/14 194 lb 12.8 oz (88.361 kg)  10/13/11 232 lb (105.235 kg)     Past Medical History  Diagnosis Date  . Coronary artery disease     80% distal RCA stenosis.  . Thyroid disease     HYPOTHYROIDISM  . Nontoxic multinodular goiter   . Psychosexual dysfunction with inhibited sexual excitement     ERECTILE DYSFUNCTION  . Hypertrophy of prostate without urinary obstruction and other lower urinary tract symptoms (LUTS)     BPH  . Hypertension   . Wide-complex tachycardia   . Parkinson's disease   . Status post biventricular cardiac pacemaker insertion     Status post CRT-D.    Current Outpatient Prescriptions  Medication Sig Dispense Refill  . amantadine (SYMMETREL) 100 MG capsule Take 100 mg by mouth  2 (two) times daily.     Marland Kitchen amiodarone (PACERONE) 200 MG tablet Take 1 tablet (200 mg total) by mouth 2 (two) times daily. 60 tablet 3  . aspirin EC 81 MG EC tablet Take 1 tablet (81 mg total) by mouth daily.    . DULoxetine (CYMBALTA) 30 MG capsule Take 30 mg by mouth daily.      . furosemide (LASIX) 20 MG tablet Take 1 tablet (20 mg total) by mouth daily. (Patient taking differently: Take 20 mg by mouth as needed. ) 30 tablet 3  . lisinopril (PRINIVIL,ZESTRIL) 2.5 MG tablet Take 1 tablet (2.5 mg total) by mouth daily. 30 tablet 5  . metoprolol succinate (TOPROL-XL) 12.5 mg TB24 24 hr tablet Take 0.5 tablets (12.5 mg total) by mouth daily. 30 tablet 5  . Multiple Vitamins-Minerals (ICAPS MV PO) Take 2 by mouth twice daily     . nitroGLYCERIN (NITROSTAT) 0.4 MG SL tablet Place 0.4 mg under the tongue every 5 (five) minutes as needed.      . Omega-3 Fatty Acids (FISH OIL) 1000 MG CAPS Take one by mouth twice daily      . pantoprazole (PROTONIX) 40 MG tablet Take 1 tablet (40 mg total) by mouth daily. 30 tablet 5  . rotigotine (NEUPRO) 6 MG/24HR Place 1 patch onto the skin daily.    . simvastatin (ZOCOR) 20 MG tablet Take 1 tablet (20 mg total) by mouth at bedtime. 30 tablet 0  .  ticagrelor (BRILINTA) 90 MG TABS tablet Take 1 tablet (90 mg total) by mouth 2 (two) times daily. 60 tablet 11  . torsemide (DEMADEX) 100 MG tablet Take 50-100 mg by mouth daily as needed (edema).     . vitamin C (ASCORBIC ACID) 500 MG tablet Take 500 mg by mouth.     No current facility-administered medications for this visit.    Allergies:   No Known Allergies  Social History:  The patient  reports that he quit smoking about 19 years ago. His smoking use included Cigarettes. He has a 45 pack-year smoking history. He has never used smokeless tobacco. He reports that he does not drink alcohol or use illicit drugs.   ROS:  Please see the history of present illness.   Appetite is stable. Denies chills or fever.   All  other systems reviewed and negative.   OBJECTIVE: VS:  BP 116/62 mmHg  Pulse 61  Ht 6' (1.829 m)  Wt 199 lb (90.266 kg)  BMI 26.98 kg/m2  SpO2 92% Well nourished, well developed, in no acute distress, healthy appearing HEENT: normal Neck: JVD flat. Carotid bruit absent  Cardiac:  normal S1, S2; RRR; no murmur Lungs:  clear to auscultation bilaterally, no wheezing, rhonchi or rales Abd: soft, nontender, no hepatomegaly Ext: Edema absent. Pulses 2+ Skin: warm and dry Neuro:  CNs 2-12 intact, no focal abnormalities noted  EKG:  Not performed       Signed, Illene Labrador III, MD 06/23/2014 11:14 AM

## 2014-07-02 ENCOUNTER — Telehealth: Payer: Self-pay | Admitting: Interventional Cardiology

## 2014-07-02 NOTE — Telephone Encounter (Signed)
1) Advised daughter no PT/INR checks necessary  2) Recommended Robitussin plain, daughter requesting Robitussin DM be used. Advised usually not recommended but would forward to Dr Tamala Julian   3) Called patients insurance company to request what alternatives suggested (per daughter patient has not tried and failed any other PPI's) and held for 20 minutes and never came back. Will forward to Dr Thompson Caul covering CMA Lattie Haw for her to follow up. Insurance phone # [phone]

## 2014-07-02 NOTE — Telephone Encounter (Signed)
He can use whatever generic PPI his policy will cover.

## 2014-07-02 NOTE — Telephone Encounter (Signed)
New message     Pt is now on brilinta---should he continue with pt/inr checks? Ins will not cover pantoprazole for 2016 unless doctor says he must have it---please call [phone] to get it covered or fax [phone].  Must submit a statement to support your request. Pt has a bad cough--what can he take.

## 2014-07-03 MED ORDER — OMEPRAZOLE 20 MG PO CPDR: 20.0000 mg | DELAYED_RELEASE_CAPSULE | Freq: Every day | ORAL | Status: DC

## 2014-07-03 NOTE — Telephone Encounter (Signed)
Spoke to pharmacy technician at [facility] [phone]) who states that PPI covered by patient's plan is omeprazole 20 mg daily. Left message for patient to call back to inform him of new medication and to order.

## 2014-07-03 NOTE — Telephone Encounter (Signed)
Informed [name] that Omeprazole 20 mg daily is covered under plan.  Rx called to requested pharmacy.

## 2014-07-15 ENCOUNTER — Encounter: Payer: Self-pay | Admitting: *Deleted

## 2014-07-15 NOTE — Progress Notes (Signed)
Patient ID: Thomas [name], male   DOB: September 22, 1933, 81 y.o.   MRN: [id number] [employer] 30 day cardiac event monitor started 06/20/14. Expected end of service will be 07/19/14.

## 2014-07-28 ENCOUNTER — Telehealth: Payer: Self-pay

## 2014-07-28 NOTE — Telephone Encounter (Signed)
Pt and pt daughter aware of cardiac monitor results -Pacing noted -No Afib -Monitor is normal Pt and pt daughter verbalized understanding.

## 2014-08-12 ENCOUNTER — Encounter: Payer: Self-pay | Admitting: Interventional Cardiology

## 2014-08-12 ENCOUNTER — Ambulatory Visit (INDEPENDENT_AMBULATORY_CARE_PROVIDER_SITE_OTHER): Payer: Medicare Other | Admitting: Interventional Cardiology

## 2014-08-12 VITALS — BP 102/62 | HR 62 | Ht 72.0 in | Wt 196.8 lb

## 2014-08-12 DIAGNOSIS — I4891 Unspecified atrial fibrillation: Secondary | ICD-10-CM

## 2014-08-12 DIAGNOSIS — I2510 Atherosclerotic heart disease of native coronary artery without angina pectoris: Secondary | ICD-10-CM

## 2014-08-12 DIAGNOSIS — Z7902 Long term (current) use of antithrombotics/antiplatelets: Secondary | ICD-10-CM

## 2014-08-12 DIAGNOSIS — Z79899 Other long term (current) drug therapy: Secondary | ICD-10-CM | POA: Diagnosis not present

## 2014-08-12 DIAGNOSIS — I5022 Chronic systolic (congestive) heart failure: Secondary | ICD-10-CM

## 2014-08-12 DIAGNOSIS — Z789 Other specified health status: Secondary | ICD-10-CM | POA: Insufficient documentation

## 2014-08-12 DIAGNOSIS — R0600 Dyspnea, unspecified: Secondary | ICD-10-CM | POA: Diagnosis not present

## 2014-08-12 DIAGNOSIS — Z5181 Encounter for therapeutic drug level monitoring: Secondary | ICD-10-CM | POA: Insufficient documentation

## 2014-08-12 LAB — BASIC METABOLIC PANEL
BUN: 28 mg/dL — ABNORMAL HIGH (ref 6–23)
CHLORIDE: 106 meq/L (ref 96–112)
CO2: 30 meq/L (ref 19–32)
Calcium: 9.4 mg/dL (ref 8.4–10.5)
Creatinine, Ser: 1.44 mg/dL (ref 0.40–1.50)
GFR: 50.33 mL/min — ABNORMAL LOW (ref >60.00)
Glucose, Bld: 87 mg/dL (ref 70–99)
POTASSIUM: 4.2 meq/L (ref 3.5–5.1)
Sodium: 141 mEq/L (ref 135–145)

## 2014-08-12 LAB — HEPATIC FUNCTION PANEL
ALT: 21 U/L (ref 0–53)
AST: 23 U/L (ref 0–37)
Albumin: 4.2 g/dL (ref 3.5–5.2)
Alkaline Phosphatase: 66 U/L (ref 39–117)
BILIRUBIN TOTAL: 0.8 mg/dL (ref 0.2–1.2)
Bilirubin, Direct: 0.2 mg/dL (ref 0.0–0.3)
Total Protein: 6.6 g/dL (ref 6.0–8.3)

## 2014-08-12 LAB — TSH: TSH: 0.59 u[IU]/mL (ref 0.35–4.50)

## 2014-08-12 LAB — BRAIN NATRIURETIC PEPTIDE: Pro B Natriuretic peptide (BNP): 400 pg/mL — ABNORMAL HIGH (ref 0.0–100.0)

## 2014-08-12 LAB — SEDIMENTATION RATE: SED RATE: 4 mm/h (ref 0–22)

## 2014-08-12 MED ORDER — AMIODARONE HCL 200 MG PO TABS: 200.0000 mg | ORAL_TABLET | Freq: Every day | ORAL | Status: DC

## 2014-08-12 NOTE — Patient Instructions (Signed)
Your physician has recommended you make the following change in your medication:  1) REDUCE Amiodarone to 200mg  daily  Lab Today:Tsh, Hepatic, Bnp  Your physician recommends that you schedule a follow-up appointment in: 3 months with Dr.Smith

## 2014-08-12 NOTE — Progress Notes (Signed)
Cardiology Office Note   Date:  08/12/2014   ID:  Thomas [name], DOB April 12, 1934, MRN [id number]  PCP:  Madaline Brilliant, MD  Cardiologist:  Sinclair Grooms, MD   Chief Complaint  Patient presents with  . Shortness of Breath    having falls      History of Present Illness: Thomas [name] is a 81 y.o. male who presents for follow-up of coronary artery disease for which he underwent high risk PTCA and stenting of the LAD and circumflex on June 12, 2014 with hemodynamic support using [place]. He additionally has paroxysmal atrial fibrillation for which he was started on amiodarone during that hospital stay. Atrial fibrillation precipitated acute on chronic systolic heart failure in the setting of severe underlying coronary disease. He has a previous history of atrial fibrillation and had been managed on Coumadin. Coumadin was discontinued after amiodarone was loaded with the hope that maintaining sinus rhythm would obviate the need for triple drug therapy after he underwent double vessel stenting. He is currently on Brilinta and aspirin after it was noted that he was a nonresponder to Plavix.  He has done relatively well. He does notice dyspnea on exertion. He also knows that he has much less energy. No angina. He does not feel he has had any episodes of atrial fib. A 30 day monitor after discharge did not demonstrate recurrence of atrial fibrillation.    Past Medical History  Diagnosis Date  . Coronary artery disease     80% distal RCA stenosis.  . Thyroid disease     HYPOTHYROIDISM  . Nontoxic multinodular goiter   . Psychosexual dysfunction with inhibited sexual excitement     ERECTILE DYSFUNCTION  . Hypertrophy of prostate without urinary obstruction and other lower urinary tract symptoms (LUTS)     BPH  . Hypertension   . Wide-complex tachycardia   . Parkinson's disease   . Status post biventricular cardiac pacemaker insertion     Status post CRT-D.     Past Surgical History  Procedure Laterality Date  . Cardiac catheterization    . Cardioversion N/A 06/03/2014    Procedure: CARDIOVERSION;  Surgeon: Thompson Grayer, MD;  Location: [facility] CATH LAB;  Service: Cardiovascular;  Laterality: N/A;  . Left heart catheterization with coronary angiogram N/A 06/10/2014    Procedure: LEFT HEART CATHETERIZATION WITH CORONARY ANGIOGRAM;  Surgeon: Sinclair Grooms, MD;  Location: [facility] CATH LAB;  Service: Cardiovascular;  Laterality: N/A;  . Abdominal angiogram  06/10/2014    Procedure: ABDOMINAL ANGIOGRAM;  Surgeon: Sinclair Grooms, MD;  Location: [facility] CATH LAB;  Service: Cardiovascular;;  . Percutaneous coronary stent intervention (pci-s) N/A 06/12/2014    Procedure: PERCUTANEOUS CORONARY STENT INTERVENTION (PCI-S);  Surgeon: Sinclair Grooms, MD;  Location: [facility] CATH LAB;  Service: Cardiovascular;  Laterality: N/A;  . Percutaneous coronary rotoblator intervention (pci-r)  06/12/2014    Procedure: PERCUTANEOUS CORONARY ROTOBLATOR INTERVENTION (PCI-R);  Surgeon: Sinclair Grooms, MD;  Location: [facility] CATH LAB;  Service: Cardiovascular;;  . Cardiac catheterization  06/12/2014    Procedure: CORONARY STENT INTERVENTION W/IMPELLA;  Surgeon: Sinclair Grooms, MD;  Location: [facility] CATH LAB;  Service: Cardiovascular;;     Current Outpatient Prescriptions  Medication Sig Dispense Refill  . amantadine (SYMMETREL) 100 MG capsule Take 100 mg by mouth 2 (two) times daily.     Marland Kitchen amiodarone (PACERONE) 200 MG tablet Take 1 tablet (200 mg total) by mouth daily.    Marland Kitchen aspirin EC  81 MG EC tablet Take 1 tablet (81 mg total) by mouth daily.    . DULoxetine (CYMBALTA) 30 MG capsule Take 30 mg by mouth daily.      Marland Kitchen lisinopril (PRINIVIL,ZESTRIL) 2.5 MG tablet Take 1 tablet (2.5 mg total) by mouth daily. 30 tablet 5  . Multiple Vitamins-Minerals (ICAPS MV PO) Take 2 by mouth twice daily     . nitroGLYCERIN (NITROSTAT) 0.4 MG SL tablet Place 0.4 mg under the tongue every 5 (five)  minutes as needed.      . Omega-3 Fatty Acids (FISH OIL) 1000 MG CAPS Take one by mouth twice daily      . omeprazole (PRILOSEC) 20 MG capsule Take 1 capsule (20 mg total) by mouth daily. 30 capsule 11  . rotigotine (NEUPRO) 6 MG/24HR Place 1 patch onto the skin daily.    . simvastatin (ZOCOR) 20 MG tablet Take 1 tablet (20 mg total) by mouth at bedtime. 30 tablet 0  . ticagrelor (BRILINTA) 90 MG TABS tablet Take 1 tablet (90 mg total) by mouth 2 (two) times daily. 60 tablet 11  . torsemide (DEMADEX) 100 MG tablet Take 50-100 mg by mouth daily as needed (edema).     . vitamin C (ASCORBIC ACID) 500 MG tablet Take 500 mg by mouth.    . metoprolol succinate (TOPROL-XL) 12.5 mg TB24 24 hr tablet Take 0.5 tablets (12.5 mg total) by mouth daily. (Patient not taking: Reported on 08/12/2014) 30 tablet 5   No current facility-administered medications for this visit.    Allergies:   Review of patient's allergies indicates no known allergies.    Social History:  The patient  reports that he quit smoking about 19 years ago. His smoking use included Cigarettes. He has a 45 pack-year smoking history. He has never used smokeless tobacco. He reports that he does not drink alcohol or use illicit drugs.   Family History:  The patient's family history includes Coronary artery disease in his other; Heart attack in his mother. There is no history of Thyroid disease.    ROS:  Please see the history of present illness.   Otherwise, review of systems are positive for decreased energy and dyspnea.   All other systems are reviewed and negative.    PHYSICAL EXAM: VS:  BP 102/62 mmHg  Pulse 62  Ht 6' (1.829 m)  Wt 196 lb 12.8 oz (89.268 kg)  BMI 26.69 kg/m2 , BMI Body mass index is 26.69 kg/(m^2). GEN: Well nourished, well developed, in no acute distress HEENT: normal Neck: no JVD, carotid bruits, or masses Cardiac: RRR; no murmurs, rubs, or gallops,no edema  Respiratory:  clear to auscultation bilaterally,  normal work of breathing GI: soft, nontender, nondistended, + BS MS: no deformity or atrophy Skin: warm and dry, no rash Neuro:  Strength and sensation are intact Psych: euthymic mood, full affect   EKG:  EKG is not ordered today.    Recent Labs: 06/04/2014: Magnesium 2.0 06/13/2014: B Natriuretic Peptide 740.2*; BUN 15; Creatinine 1.10; Potassium 4.5; Sodium 135 06/15/2014: Hemoglobin 10.5*; Platelets 192    Lipid Panel No results found for: CHOL, TRIG, HDL, CHOLHDL, VLDL, LDLCALC, LDLDIRECT    Wt Readings from Last 3 Encounters:  08/12/14 196 lb 12.8 oz (89.268 kg)  06/23/14 199 lb (90.266 kg)  06/15/14 194 lb 12.8 oz (88.361 kg)      Other studies Reviewed: Additional studies/ records that were reviewed today include: .    ASSESSMENT AND PLAN:  1.  LAD  and circumflex drug-eluting stent, December 2015 without recurrence of angina.  2. Paroxysmal atrial fibrillation now clinically maintaining sinus rhythm on amiodarone 3. Amiodarone therapy. He has been on 400 mg per day since discharge from the hospital in January. 4. Chronic systolic heart failure with no evidence of volume overload 5. Dyspnea possibly related to amiodarone versus congestive heart failure.    Current medicines are reviewed at length with the patient today.  The patient does not have concerns regarding medicines.  The following changes have been made:  Decrease amiodarone to 200 mg daily  Labs/ tests ordered today include:  Orders Placed This Encounter  Procedures  . TSH  . Hepatic function panel  . B Nat Peptide  Sedimentation rate and basic metabolic panel will also be done   Disposition:   FU with Linard Millers in 3 months   Signed, Sinclair Grooms, MD  08/12/2014 12:59 PM    [facility] [place], [place], [place]  [address] Phone: [phone]; Fax: [phone]

## 2014-08-14 ENCOUNTER — Telehealth: Payer: Self-pay

## 2014-08-14 NOTE — Telephone Encounter (Signed)
Pt aware of lab results Thyroid and liver okay.     No inflammation in lung.  May have mild fluid build up.  Lets see how we do on lower dose amiodarone.      Pt verbalized understanding

## 2014-08-14 NOTE — Telephone Encounter (Signed)
-----   Message from Sinclair Grooms, MD sent at 08/12/2014  6:03 PM EST ----- Thyroid and liver okay. No inflammation in lung. May have mild fluid build up. Lets see how we do on lower dose amiodarone.

## 2014-12-03 ENCOUNTER — Ambulatory Visit (INDEPENDENT_AMBULATORY_CARE_PROVIDER_SITE_OTHER): Payer: Medicare Other | Admitting: Interventional Cardiology

## 2014-12-03 ENCOUNTER — Encounter: Payer: Self-pay | Admitting: Interventional Cardiology

## 2014-12-03 VITALS — BP 124/64 | HR 61 | Ht 72.0 in | Wt 184.8 lb

## 2014-12-03 DIAGNOSIS — N189 Chronic kidney disease, unspecified: Secondary | ICD-10-CM

## 2014-12-03 DIAGNOSIS — I2510 Atherosclerotic heart disease of native coronary artery without angina pectoris: Secondary | ICD-10-CM

## 2014-12-03 DIAGNOSIS — N183 Chronic kidney disease, stage 3 unspecified: Secondary | ICD-10-CM

## 2014-12-03 DIAGNOSIS — Z79899 Other long term (current) drug therapy: Secondary | ICD-10-CM

## 2014-12-03 DIAGNOSIS — I5022 Chronic systolic (congestive) heart failure: Secondary | ICD-10-CM | POA: Diagnosis not present

## 2014-12-03 DIAGNOSIS — I10 Essential (primary) hypertension: Secondary | ICD-10-CM

## 2014-12-03 DIAGNOSIS — I4891 Unspecified atrial fibrillation: Secondary | ICD-10-CM

## 2014-12-03 LAB — BASIC METABOLIC PANEL
BUN: 42 mg/dL — ABNORMAL HIGH (ref 6–23)
CHLORIDE: 103 meq/L (ref 96–112)
CO2: 30 mEq/L (ref 19–32)
Calcium: 9.5 mg/dL (ref 8.4–10.5)
Creatinine, Ser: 2.2 mg/dL — ABNORMAL HIGH (ref 0.40–1.50)
GFR: 30.84 mL/min — ABNORMAL LOW (ref >60.00)
GLUCOSE: 87 mg/dL (ref 70–99)
POTASSIUM: 4.5 meq/L (ref 3.5–5.1)
Sodium: 141 mEq/L (ref 135–145)

## 2014-12-03 LAB — BRAIN NATRIURETIC PEPTIDE: Pro B Natriuretic peptide (BNP): 375 pg/mL — ABNORMAL HIGH (ref 0.0–100.0)

## 2014-12-03 NOTE — Patient Instructions (Addendum)
Medication Instructions:  Your physician has recommended you make the following change in your medication:  STOP Lisinopril  Labwork: Bmet, Bnp today  Testing/Procedures: None   Follow-Up: You have a follow up appointment on 01/26/15 @ 3pm  Any Other Special Instructions Will Be Listed Below (If Applicable). Call the office in 2 weeks with an update of your cough

## 2014-12-03 NOTE — Progress Notes (Signed)
Cardiology Office Note   Date:  12/03/2014   ID:  Thomas [name], DOB 1933-06-18, MRN [id number]  PCP:  Madaline Brilliant, MD  Cardiologist:  Sinclair Grooms, MD   Chief Complaint  Patient presents with  . Congestive Heart Failure      History of Present Illness: Thomas [name] is a 81 y.o. male who presents for systolic heart failure, LAD and circumflex high-risk stenting in December 2015, chronic kidney disease, and COPD.  Recently hospitalized at [facility] because of cough. Antibody expressed restarted. He denies orthopnea and PND. Is no peripheral edema. He has lost about 12 pounds since I saw him in January. He gets dizzy when he stands.    Past Medical History  Diagnosis Date  . Coronary artery disease     80% distal RCA stenosis.  . Thyroid disease     HYPOTHYROIDISM  . Nontoxic multinodular goiter   . Psychosexual dysfunction with inhibited sexual excitement     ERECTILE DYSFUNCTION  . Hypertrophy of prostate without urinary obstruction and other lower urinary tract symptoms (LUTS)     BPH  . Hypertension   . Wide-complex tachycardia   . Parkinson's disease   . Status post biventricular cardiac pacemaker insertion     Status post CRT-D.    Past Surgical History  Procedure Laterality Date  . Cardiac catheterization    . Cardioversion N/A 06/03/2014    Procedure: CARDIOVERSION;  Surgeon: Thompson Grayer, MD;  Location: [facility] CATH LAB;  Service: Cardiovascular;  Laterality: N/A;  . Left heart catheterization with coronary angiogram N/A 06/10/2014    Procedure: LEFT HEART CATHETERIZATION WITH CORONARY ANGIOGRAM;  Surgeon: Sinclair Grooms, MD;  Location: [facility] CATH LAB;  Service: Cardiovascular;  Laterality: N/A;  . Abdominal angiogram  06/10/2014    Procedure: ABDOMINAL ANGIOGRAM;  Surgeon: Sinclair Grooms, MD;  Location: [facility] CATH LAB;  Service: Cardiovascular;;  . Percutaneous coronary stent intervention (pci-s) N/A 06/12/2014    Procedure: PERCUTANEOUS  CORONARY STENT INTERVENTION (PCI-S);  Surgeon: Sinclair Grooms, MD;  Location: [facility] CATH LAB;  Service: Cardiovascular;  Laterality: N/A;  . Percutaneous coronary rotoblator intervention (pci-r)  06/12/2014    Procedure: PERCUTANEOUS CORONARY ROTOBLATOR INTERVENTION (PCI-R);  Surgeon: Sinclair Grooms, MD;  Location: [facility] CATH LAB;  Service: Cardiovascular;;  . Cardiac catheterization  06/12/2014    Procedure: CORONARY STENT INTERVENTION W/IMPELLA;  Surgeon: Sinclair Grooms, MD;  Location: [facility] CATH LAB;  Service: Cardiovascular;;     Current Outpatient Prescriptions  Medication Sig Dispense Refill  . amantadine (SYMMETREL) 100 MG capsule Take 100 mg by mouth 2 (two) times daily.     Marland Kitchen amiodarone (PACERONE) 200 MG tablet Take 1 tablet (200 mg total) by mouth daily.    Marland Kitchen aspirin EC 81 MG EC tablet Take 1 tablet (81 mg total) by mouth daily.    . DULoxetine (CYMBALTA) 30 MG capsule Take 30 mg by mouth daily.      . metoprolol succinate (TOPROL-XL) 12.5 mg TB24 24 hr tablet Take 0.5 tablets (12.5 mg total) by mouth daily. 30 tablet 5  . Multiple Vitamins-Minerals (ICAPS MV PO) Take 2 by mouth twice daily     . nitroGLYCERIN (NITROSTAT) 0.4 MG SL tablet Place 0.4 mg under the tongue every 5 (five) minutes as needed.      . Omega-3 Fatty Acids (FISH OIL) 1000 MG CAPS Take one by mouth twice daily      . omeprazole (PRILOSEC) 20 MG capsule  Take 1 capsule (20 mg total) by mouth daily. 30 capsule 11  . potassium chloride SA (K-DUR,KLOR-CON) 20 MEQ tablet Take 20 mEq by mouth daily.    . rotigotine (NEUPRO) 6 MG/24HR Place 1 patch onto the skin daily.    . simvastatin (ZOCOR) 20 MG tablet Take 1 tablet (20 mg total) by mouth at bedtime. 30 tablet 0  . ticagrelor (BRILINTA) 90 MG TABS tablet Take 1 tablet (90 mg total) by mouth 2 (two) times daily. 60 tablet 11  . torsemide (DEMADEX) 100 MG tablet Take 50-100 mg by mouth daily as needed (edema).     . vitamin C (ASCORBIC ACID) 500 MG tablet Take 500 mg  by mouth.     No current facility-administered medications for this visit.    Allergies:   Review of patient's allergies indicates no known allergies.    Social History:  The patient  reports that he quit smoking about 19 years ago. His smoking use included Cigarettes. He has a 45 pack-year smoking history. He has never used smokeless tobacco. He reports that he does not drink alcohol or use illicit drugs.   Family History:  The patient's family history includes Coronary artery disease in his other; Heart attack in his mother. There is no history of Thyroid disease.    ROS:  Please see the history of present illness.   Otherwise, review of systems are positive for Parkinson's is increasing in severity..   All other systems are reviewed and negative.    PHYSICAL EXAM: VS:  BP 124/64 mmHg  Pulse 61  Ht 6' (1.829 m)  Wt 83.825 kg (184 lb 12.8 oz)  BMI 25.06 kg/m2  SpO2 93% , BMI Body mass index is 25.06 kg/(m^2). GEN: Well nourished, well developed, in no acute distress HEENT: normal Neck: no JVD, carotid bruits, or masses Cardiac: RRR; no murmurs, rubs, or gallops,no edema  Respiratory:  clear to auscultation bilaterally, normal work of breathing GI: soft, nontender, nondistended, + BS MS: no deformity or atrophy Skin: warm and dry, no rash Neuro:  Strength and sensation are intact Psych: euthymic mood, full affect   EKG:  EKG is not ordered today   Recent Labs: 06/04/2014: Magnesium 2.0 06/13/2014: B Natriuretic Peptide 740.2* 06/15/2014: Hemoglobin 10.5*; Platelets 192 08/12/2014: ALT 21; BUN 28*; Creatinine, Ser 1.44; Potassium 4.2; Pro B Natriuretic peptide (BNP) 400.0*; Sodium 141; TSH 0.59    Lipid Panel No results found for: CHOL, TRIG, HDL, CHOLHDL, VLDL, LDLCALC, LDLDIRECT    Wt Readings from Last 3 Encounters:  12/03/14 83.825 kg (184 lb 12.8 oz)  08/12/14 89.268 kg (196 lb 12.8 oz)  06/23/14 90.266 kg (199 lb)      Other studies Reviewed: Additional  studies/ records that were reviewed today include:  Review of the above records demonstrates: Recent Hospital stay at [facility]. Had cough and treated with antibiotic and [place]   ASSESSMENT AND PLAN:  CAD with prior high risk PCI -  Impella protected LAD rotational atherectomy with stenting and left circumflex stent 06/12/2014  On amiodarone therapy - no side effects  Essential hypertension - OKAY TODAY  Chronic systolic heart failure - No evidence volume overload  Atrial fibrillation with RVR- DCCV 06/03/14  Chronic renal insufficiency, stage III (moderate) - check labs today  Cough - possibly due to lisinopril   Current medicines are reviewed at length with the patient today.  The patient has concerns regarding medicines.  The following changes have been made:  Discontinue lisinopril as that  is probably causing the patient's cough. He is taking torsemide 100 mg 2 times per week. I will check laboratory data including a BNP to determine what impact that dose of the diarrhetic is having on kidney function.  My goal overtime as a give the patient on a stable daily diarrhetic regimen rather than taking 50 mg of torsemide once or twice a week with dramatic diuresis and swings and electrolytes.  Labs/ tests ordered today include:  No orders of the defined types were placed in this encounter.     Disposition:   FU with hs in 8 weeks  Signed, Sinclair Grooms, MD  12/03/2014 3:42 PM    [facility] [place], [place], [place]  [address] Phone: [phone]; Fax: [phone]

## 2014-12-04 ENCOUNTER — Telehealth: Payer: Self-pay

## 2014-12-04 DIAGNOSIS — I5022 Chronic systolic (congestive) heart failure: Secondary | ICD-10-CM

## 2014-12-04 MED ORDER — FUROSEMIDE 40 MG PO TABS: 40.0000 mg | ORAL_TABLET | ORAL | Status: DC

## 2014-12-04 NOTE — Telephone Encounter (Signed)
-----   Message from Belva Crome, MD sent at 12/03/2014  5:53 PM EDT ----- Kidney function is poor.  Probably due to dehydration. Discontinue Torsimide 50-100 mg taken as currently doing. Start furosemide 40 mg M, W, F of every week. Start after no Torsamide for 5 days BMET 1 week after starting furosemide

## 2014-12-04 NOTE — Telephone Encounter (Signed)
Pt daughter aware of lab results and Dr.Smith's instructions.Kidney function is poor.  Probably due to dehydration. Discontinue Torsimide 50-100 mg taken as currently doing. Start furosemide 40 mg M, W, F of every week. Start after no Torsamide for 5 days BMET 1 week after starting furosemide Rx sent to pt pharmacy Lab appt scheduled for 12/16/14 Pt daughter verbalized understanding

## 2014-12-10 ENCOUNTER — Telehealth: Payer: Self-pay | Admitting: Interventional Cardiology

## 2014-12-10 DIAGNOSIS — R0600 Dyspnea, unspecified: Secondary | ICD-10-CM

## 2014-12-10 DIAGNOSIS — I5022 Chronic systolic (congestive) heart failure: Secondary | ICD-10-CM

## 2014-12-10 DIAGNOSIS — I4891 Unspecified atrial fibrillation: Secondary | ICD-10-CM

## 2014-12-10 DIAGNOSIS — I2510 Atherosclerotic heart disease of native coronary artery without angina pectoris: Secondary | ICD-10-CM

## 2014-12-10 DIAGNOSIS — Z79899 Other long term (current) drug therapy: Secondary | ICD-10-CM

## 2014-12-10 NOTE — Telephone Encounter (Signed)
New message Pt c/o medication issue:  1. Name of Medication:lisinopril, Brilanta, Amiodarone, simvastatin   3. Are you having a reaction (difficulty breathing--STAT)? Fatigue, SOB randomly   4. What is your medication issue? Pt daughter called states that the pt cough is much better after coming off of lisinopril . There has been a drastic change since he was in the hospital. And yesterday 06/28 pt was seen by Dr. Kandra Nicolas office and they suggested that the symptoms are coming from [place] or Amiodarone that is making the pt feel SOB and extreme fatigue. It also could be simvastatin as well. Please call back to discuss changing either of these medications to make the pt feel better.

## 2014-12-10 NOTE — Telephone Encounter (Signed)
It would be okay to switch to clopidogrel 75 mg per day and discontinue Brilinta. He should take one clopidogrel tablet with the last Brilinta tablet and then take clopidogrel 75 mg per day thereafter.  He needs to continue statin therapy. We should do one thing at a time.

## 2014-12-10 NOTE — Telephone Encounter (Signed)
Returned pt daughter call. Pt cough has improved since Lisinopril was d/c.Pt was seen by his Neurologist office yesterday.  Since having his stents in Dec 2015, Pt has been complaining about increased fatigue and sob,. Pt daughter Domenic Schwab that they contributed pt complaint to his Parkinson's. Dr.Malones Nurse Practioner ,adv her f/u with Dr.Smith to see if any medication adjustments were needed. She felt that pt Amio, Brillinta, Simvastatin could be contributing to pt sob and fatigue.Valerie Roys I wil fwd an update to Dr.Smith and call back with his recommendation. She verbalized understanding.

## 2014-12-11 ENCOUNTER — Telehealth: Payer: Self-pay | Admitting: Interventional Cardiology

## 2014-12-11 MED ORDER — CLOPIDOGREL BISULFATE 75 MG PO TABS: 75.0000 mg | ORAL_TABLET | Freq: Every day | ORAL | Status: DC

## 2014-12-11 NOTE — Telephone Encounter (Signed)
New Message     Calling in to speak to [facility] about Pt

## 2014-12-11 NOTE — Telephone Encounter (Signed)
New Message  Pt wife called. Req a call back to discuss if the lab appt on 07/05 is needed. Please call back to discuss

## 2014-12-11 NOTE — Telephone Encounter (Signed)
Returned call lmtcb. Pt should keep lab appt

## 2014-12-11 NOTE — Telephone Encounter (Signed)
Called to give pt daughter [name] Dr.Smith's response.lmtcb

## 2014-12-11 NOTE — Telephone Encounter (Signed)
New Message Pt daughter calling to relay info to [place]: Pt is going to hosp on Tues 7/5  Blood test fax #'s  General Lab fax # [phone] Fax # for registration [phone]

## 2014-12-11 NOTE — Telephone Encounter (Signed)
Pt daughter [name] aware of Dr.Smith's response. It would be okay to switch to clopidogrel 75 mg per day and discontinue Brilinta. He should take one clopidogrel tablet with the last Brilinta tablet and then take clopidogrel 75 mg per day thereafter.  He needs to continue statin therapy. We should do one thing at a time. Pt daughter verbalized understanding. Rx for Plavix sent to pt pharmacy. Pt is due for a repeat Bmet on 7/5. Pt lives in [place] and it would be 1 1/2 hr drive. Pt daughter is requesting a written lab order be faxed to their local Hospital, so that pt labs can be drawn there and the results faxed to our office.  She will call back to give the hospital's fax info

## 2014-12-11 NOTE — Telephone Encounter (Signed)
Order for pt have bmet drawn on 7/5 at his local hospital in [place] faxed to fax@ provided by pt daughter

## 2014-12-16 ENCOUNTER — Other Ambulatory Visit: Payer: Medicare Other

## 2014-12-17 ENCOUNTER — Encounter: Payer: Self-pay | Admitting: Interventional Cardiology

## 2014-12-17 MED ORDER — AMIODARONE HCL 200 MG PO TABS: 200.0000 mg | ORAL_TABLET | Freq: Every day | ORAL | Status: DC

## 2014-12-17 NOTE — Telephone Encounter (Signed)
Pt c/o medication issue: 1. Name of Medication: Amiodarone 2. How are you currently taking this medication (dosage and times per day)? 200 mg  1 tab twice daily   4. What is your medication issue? Pt wife called states that she was advised to continue Amiodarone. Script was written by another provider that the pt is no longer established with and [facility] to [facility]. Please assist

## 2014-12-17 NOTE — Telephone Encounter (Signed)
Refill for amiodarone sent to pt's pharmacy

## 2014-12-18 ENCOUNTER — Telehealth: Payer: Self-pay | Admitting: *Deleted

## 2014-12-18 NOTE — Telephone Encounter (Signed)
[facility]ist from [facility] called in regards to the rx that was sent in for the amiodarone. He stated that the patient had always taken 200mg  bid, but the rx was sent in for 200mg  qd. I did not see any documentation that his dose was reduced. Please advise. Thanks, MI

## 2014-12-18 NOTE — Telephone Encounter (Signed)
Spoke with pt's daughter and the pharmacist at [facility] pharmacy and  let them know that pt is to take Amiodarone 200 mg once daily.

## 2014-12-18 NOTE — Telephone Encounter (Signed)
Our records indicate pt is taking amiodarone once daily. This was decreased at office visit on August 12, 2014.  Will forward to triage to contact pt's daughter to see how pt has been taking.

## 2014-12-19 NOTE — Telephone Encounter (Signed)
Spoke with pt's daughter who is calling to report weights as instructed earlier this week (see recent lab note).  Pt will continue on Monday, Wednesday, Friday furosemide dose.  Daughter will let us know if increased weight, shortness of breath or swelling.

## 2014-12-19 NOTE — Telephone Encounter (Signed)
Follow up      12-16-14 wt 189 am--- took lasix--185 pm wt 12-17-14  wt 187 12-18-14 wt 184 Still has some swelling in legs and some sob---not as bad as earlier

## 2014-12-26 ENCOUNTER — Telehealth: Payer: Self-pay | Admitting: Interventional Cardiology

## 2014-12-26 NOTE — Telephone Encounter (Signed)
New message    Pt  Daughter states that since pt has been on lasix his right leg has been swelling and is red. B/p is 85/55 today  Pt c/o swelling: STAT is pt has developed SOB within 24 hours  1. How long have you been experiencing swelling? Couple weeks but got worse last night  2. Where is the swelling located? Right Leg  3.  Are you currently taking a "fluid pill"?Lasix 40mg   4.  Are you currently SOB? No  5.  Have you traveled recently?No  Please call to discuss

## 2014-12-26 NOTE — Telephone Encounter (Signed)
Left message for patient's daughter "[name]" to return call to followup from message left this AM.  [place] RN CCM

## 2014-12-29 NOTE — Telephone Encounter (Signed)
Spoke with daughter who is reporting pt's wt is up from 181 lbs last week to 189 lbs yesterday.  He right leg is swollen and red.  She doesn't know if it's warm to the touch.  He is SOB with activity.  He is taking Furosemide 40 mg M/W/F only as his last reported BUN and Crea are elevated.  She reports his leg is "pretty bad" now.  Advised pt needs to be seen today at the closest medical facility in order to be treated. He will need to be seen to rule out any possible infection/DVT etc as well as for his right leg edema, SOB and weight gain.  She will take him to the Urgent Care that is closest to them in [place].

## 2015-01-08 ENCOUNTER — Emergency Department (HOSPITAL_COMMUNITY): Payer: Medicare Other

## 2015-01-08 ENCOUNTER — Observation Stay (HOSPITAL_COMMUNITY): Payer: Medicare Other

## 2015-01-08 ENCOUNTER — Inpatient Hospital Stay (HOSPITAL_COMMUNITY)
Admission: EM | Admit: 2015-01-08 | Discharge: 2015-01-15 | DRG: 291 | Disposition: A | Discharge status: Skilled Nursing Facility | Payer: Medicare Other | Attending: Internal Medicine | Admitting: Internal Medicine

## 2015-01-08 ENCOUNTER — Encounter (HOSPITAL_COMMUNITY): Payer: Self-pay

## 2015-01-08 DIAGNOSIS — N289 Disorder of kidney and ureter, unspecified: Secondary | ICD-10-CM | POA: Diagnosis present

## 2015-01-08 DIAGNOSIS — Z950 Presence of cardiac pacemaker: Secondary | ICD-10-CM

## 2015-01-08 DIAGNOSIS — F329 Major depressive disorder, single episode, unspecified: Secondary | ICD-10-CM | POA: Diagnosis present

## 2015-01-08 DIAGNOSIS — R0602 Shortness of breath: Secondary | ICD-10-CM | POA: Diagnosis not present

## 2015-01-08 DIAGNOSIS — I480 Paroxysmal atrial fibrillation: Secondary | ICD-10-CM | POA: Diagnosis present

## 2015-01-08 DIAGNOSIS — Z8249 Family history of ischemic heart disease and other diseases of the circulatory system: Secondary | ICD-10-CM

## 2015-01-08 DIAGNOSIS — R0600 Dyspnea, unspecified: Secondary | ICD-10-CM

## 2015-01-08 DIAGNOSIS — E039 Hypothyroidism, unspecified: Secondary | ICD-10-CM | POA: Diagnosis present

## 2015-01-08 DIAGNOSIS — I10 Essential (primary) hypertension: Secondary | ICD-10-CM | POA: Diagnosis not present

## 2015-01-08 DIAGNOSIS — E785 Hyperlipidemia, unspecified: Secondary | ICD-10-CM | POA: Diagnosis present

## 2015-01-08 DIAGNOSIS — R404 Transient alteration of awareness: Secondary | ICD-10-CM | POA: Insufficient documentation

## 2015-01-08 DIAGNOSIS — I429 Cardiomyopathy, unspecified: Secondary | ICD-10-CM | POA: Diagnosis not present

## 2015-01-08 DIAGNOSIS — R471 Dysarthria and anarthria: Secondary | ICD-10-CM

## 2015-01-08 DIAGNOSIS — I428 Other cardiomyopathies: Secondary | ICD-10-CM

## 2015-01-08 DIAGNOSIS — I5023 Acute on chronic systolic (congestive) heart failure: Secondary | ICD-10-CM | POA: Diagnosis not present

## 2015-01-08 DIAGNOSIS — N390 Urinary tract infection, site not specified: Secondary | ICD-10-CM

## 2015-01-08 DIAGNOSIS — N184 Chronic kidney disease, stage 4 (severe): Secondary | ICD-10-CM | POA: Diagnosis present

## 2015-01-08 DIAGNOSIS — G20 Parkinson's disease: Secondary | ICD-10-CM

## 2015-01-08 DIAGNOSIS — L8990 Pressure ulcer of unspecified site, unspecified stage: Secondary | ICD-10-CM | POA: Insufficient documentation

## 2015-01-08 DIAGNOSIS — N259 Disorder resulting from impaired renal tubular function, unspecified: Secondary | ICD-10-CM

## 2015-01-08 DIAGNOSIS — Z7982 Long term (current) use of aspirin: Secondary | ICD-10-CM

## 2015-01-08 DIAGNOSIS — Z7902 Long term (current) use of antithrombotics/antiplatelets: Secondary | ICD-10-CM

## 2015-01-08 DIAGNOSIS — IMO0001 Reserved for inherently not codable concepts without codable children: Secondary | ICD-10-CM | POA: Diagnosis present

## 2015-01-08 DIAGNOSIS — N400 Enlarged prostate without lower urinary tract symptoms: Secondary | ICD-10-CM | POA: Diagnosis present

## 2015-01-08 DIAGNOSIS — G20A1 Parkinson's disease without dyskinesia, without mention of fluctuations: Secondary | ICD-10-CM | POA: Diagnosis present

## 2015-01-08 DIAGNOSIS — I951 Orthostatic hypotension: Secondary | ICD-10-CM | POA: Diagnosis present

## 2015-01-08 DIAGNOSIS — Z955 Presence of coronary angioplasty implant and graft: Secondary | ICD-10-CM

## 2015-01-08 DIAGNOSIS — G252 Other specified forms of tremor: Secondary | ICD-10-CM | POA: Diagnosis present

## 2015-01-08 DIAGNOSIS — I255 Ischemic cardiomyopathy: Secondary | ICD-10-CM | POA: Diagnosis present

## 2015-01-08 DIAGNOSIS — R410 Disorientation, unspecified: Secondary | ICD-10-CM

## 2015-01-08 DIAGNOSIS — G253 Myoclonus: Secondary | ICD-10-CM | POA: Diagnosis present

## 2015-01-08 DIAGNOSIS — I5022 Chronic systolic (congestive) heart failure: Secondary | ICD-10-CM

## 2015-01-08 DIAGNOSIS — K219 Gastro-esophageal reflux disease without esophagitis: Secondary | ICD-10-CM | POA: Diagnosis present

## 2015-01-08 DIAGNOSIS — I4891 Unspecified atrial fibrillation: Secondary | ICD-10-CM

## 2015-01-08 DIAGNOSIS — I2510 Atherosclerotic heart disease of native coronary artery without angina pectoris: Secondary | ICD-10-CM | POA: Diagnosis present

## 2015-01-08 DIAGNOSIS — I129 Hypertensive chronic kidney disease with stage 1 through stage 4 chronic kidney disease, or unspecified chronic kidney disease: Secondary | ICD-10-CM | POA: Diagnosis present

## 2015-01-08 DIAGNOSIS — Z79899 Other long term (current) drug therapy: Secondary | ICD-10-CM

## 2015-01-08 DIAGNOSIS — Z87891 Personal history of nicotine dependence: Secondary | ICD-10-CM

## 2015-01-08 DIAGNOSIS — G9340 Encephalopathy, unspecified: Secondary | ICD-10-CM | POA: Diagnosis present

## 2015-01-08 LAB — TROPONIN I
TROPONIN I: 0.03 ng/mL (ref <0.031)
Troponin I: 0.03 ng/mL (ref <0.031)

## 2015-01-08 LAB — CBC
HCT: 34 % — ABNORMAL LOW (ref 39.0–52.0)
HEMATOCRIT: 36.8 % — AB (ref 39.0–52.0)
HEMOGLOBIN: 10.9 g/dL — AB (ref 13.0–17.0)
Hemoglobin: 12 g/dL — ABNORMAL LOW (ref 13.0–17.0)
MCH: 28.6 pg (ref 26.0–34.0)
MCH: 28.2 pg (ref 26.0–34.0)
MCHC: 32.6 g/dL (ref 30.0–36.0)
MCHC: 32.1 g/dL (ref 30.0–36.0)
MCV: 87.8 fL (ref 78.0–100.0)
MCV: 87.9 fL (ref 78.0–100.0)
Platelets: 189 10*3/uL (ref 150–400)
Platelets: 148 10*3/uL — ABNORMAL LOW (ref 150–400)
RBC: 4.19 MIL/uL — AB (ref 4.22–5.81)
RBC: 3.87 MIL/uL — AB (ref 4.22–5.81)
RDW: 15.2 % (ref 11.5–15.5)
RDW: 15.2 % (ref 11.5–15.5)
WBC: 6.8 10*3/uL (ref 4.0–10.5)
WBC: 5.6 10*3/uL (ref 4.0–10.5)

## 2015-01-08 LAB — BASIC METABOLIC PANEL
Anion gap: 9 (ref 5–15)
BUN: 31 mg/dL — AB (ref 6–20)
CALCIUM: 9.2 mg/dL (ref 8.9–10.3)
CHLORIDE: 102 mmol/L (ref 101–111)
CO2: 29 mmol/L (ref 22–32)
Creatinine, Ser: 2.35 mg/dL — ABNORMAL HIGH (ref 0.61–1.24)
GFR calc Af Amer: 29 mL/min — ABNORMAL LOW (ref >60)
GFR calc non Af Amer: 25 mL/min — ABNORMAL LOW (ref >60)
Glucose, Bld: 96 mg/dL (ref 65–99)
Potassium: 4.5 mmol/L (ref 3.5–5.1)
Sodium: 140 mmol/L (ref 135–145)

## 2015-01-08 LAB — PROTIME-INR
INR: 1.1 (ref 0.00–1.49)
Prothrombin Time: 14.4 seconds (ref 11.6–15.2)

## 2015-01-08 LAB — CREATININE, SERUM
Creatinine, Ser: 2.13 mg/dL — ABNORMAL HIGH (ref 0.61–1.24)
GFR calc Af Amer: 32 mL/min — ABNORMAL LOW (ref >60)
GFR calc non Af Amer: 28 mL/min — ABNORMAL LOW (ref >60)

## 2015-01-08 LAB — URINALYSIS, ROUTINE W REFLEX MICROSCOPIC
BILIRUBIN URINE: NEGATIVE
Glucose, UA: NEGATIVE mg/dL
Hgb urine dipstick: NEGATIVE
Ketones, ur: NEGATIVE mg/dL
LEUKOCYTES UA: NEGATIVE
Nitrite: NEGATIVE
PH: 7 (ref 5.0–8.0)
Protein, ur: NEGATIVE mg/dL
Specific Gravity, Urine: 1.009 (ref 1.005–1.030)
Urobilinogen, UA: 1 mg/dL (ref 0.0–1.0)

## 2015-01-08 LAB — BRAIN NATRIURETIC PEPTIDE
B NATRIURETIC PEPTIDE 5: 538.7 pg/mL — AB (ref 0.0–100.0)
B Natriuretic Peptide: 373.8 pg/mL — ABNORMAL HIGH (ref 0.0–100.0)

## 2015-01-08 LAB — I-STAT TROPONIN, ED: Troponin i, poc: 0.04 ng/mL (ref 0.00–0.08)

## 2015-01-08 MED ORDER — PANTOPRAZOLE SODIUM 40 MG PO TBEC
40.0000 mg | DELAYED_RELEASE_TABLET | Freq: Every day | ORAL | Status: DC
  Administered 2015-01-09 – 2015-01-15 (×7): 40 mg via ORAL
  Filled 2015-01-08 (×6): qty 1

## 2015-01-08 MED ORDER — ONDANSETRON HCL 4 MG/2ML IJ SOLN: 4.0000 mg | Freq: Four times a day (QID) | INTRAMUSCULAR | Status: DC | PRN

## 2015-01-08 MED ORDER — SODIUM CHLORIDE 0.9 % IV SOLN: 250.0000 mL | INTRAVENOUS | Status: DC | PRN

## 2015-01-08 MED ORDER — METOPROLOL SUCCINATE 12.5 MG HALF TABLET
12.5000 mg | ORAL_TABLET | Freq: Every day | ORAL | Status: DC
  Administered 2015-01-09 – 2015-01-15 (×7): 12.5 mg via ORAL
  Filled 2015-01-08 (×7): qty 1

## 2015-01-08 MED ORDER — DULOXETINE HCL 30 MG PO CPEP
30.0000 mg | ORAL_CAPSULE | ORAL | Status: DC
  Administered 2015-01-09 – 2015-01-15 (×4): 30 mg via ORAL
  Filled 2015-01-08 (×4): qty 1

## 2015-01-08 MED ORDER — SODIUM CHLORIDE 0.9 % IJ SOLN: 3.0000 mL | INTRAMUSCULAR | Status: DC | PRN

## 2015-01-08 MED ORDER — AMANTADINE HCL 100 MG PO CAPS
100.0000 mg | ORAL_CAPSULE | Freq: Two times a day (BID) | ORAL | Status: DC
  Administered 2015-01-08 – 2015-01-15 (×13): 100 mg via ORAL
  Filled 2015-01-08 (×16): qty 1

## 2015-01-08 MED ORDER — TECHNETIUM TO 99M ALBUMIN AGGREGATED
6.0000 | Freq: Once | INTRAVENOUS | Status: AC | PRN
  Administered 2015-01-08: 6 via INTRAVENOUS

## 2015-01-08 MED ORDER — CLOPIDOGREL BISULFATE 75 MG PO TABS
75.0000 mg | ORAL_TABLET | Freq: Every day | ORAL | Status: DC
  Administered 2015-01-09 – 2015-01-15 (×7): 75 mg via ORAL
  Filled 2015-01-08 (×7): qty 1

## 2015-01-08 MED ORDER — PRAMIPEXOLE DIHYDROCHLORIDE 0.25 MG PO TABS
0.5000 mg | ORAL_TABLET | Freq: Three times a day (TID) | ORAL | Status: DC
  Administered 2015-01-08 – 2015-01-14 (×16): 0.5 mg via ORAL
  Filled 2015-01-08 (×20): qty 2

## 2015-01-08 MED ORDER — ENOXAPARIN SODIUM 40 MG/0.4ML ~~LOC~~ SOLN
40.0000 mg | SUBCUTANEOUS | Status: DC
  Administered 2015-01-08 – 2015-01-09 (×2): 40 mg via SUBCUTANEOUS
  Filled 2015-01-08 (×3): qty 0.4

## 2015-01-08 MED ORDER — ACETAMINOPHEN 325 MG PO TABS: 650.0000 mg | ORAL_TABLET | ORAL | Status: DC | PRN

## 2015-01-08 MED ORDER — ROTIGOTINE 6 MG/24HR TD PT24
1.0000 | MEDICATED_PATCH | Freq: Every day | TRANSDERMAL | Status: DC
  Administered 2015-01-08 – 2015-01-14 (×7): 1 via TRANSDERMAL
  Filled 2015-01-08 (×5): qty 1

## 2015-01-08 MED ORDER — ASPIRIN EC 81 MG PO TBEC
81.0000 mg | DELAYED_RELEASE_TABLET | Freq: Every day | ORAL | Status: DC
  Administered 2015-01-08 – 2015-01-15 (×8): 81 mg via ORAL
  Filled 2015-01-08 (×8): qty 1

## 2015-01-08 MED ORDER — AMIODARONE HCL 200 MG PO TABS
200.0000 mg | ORAL_TABLET | Freq: Every day | ORAL | Status: DC
  Administered 2015-01-09 – 2015-01-15 (×7): 200 mg via ORAL
  Filled 2015-01-08 (×7): qty 1

## 2015-01-08 MED ORDER — SODIUM CHLORIDE 0.9 % IJ SOLN
3.0000 mL | Freq: Two times a day (BID) | INTRAMUSCULAR | Status: DC
  Administered 2015-01-09 – 2015-01-10 (×2): 3 mL via INTRAVENOUS

## 2015-01-08 MED ORDER — TECHNETIUM TC 99M DIETHYLENETRIAME-PENTAACETIC ACID: 40.0000 | Freq: Once | INTRAVENOUS | Status: AC | PRN

## 2015-01-08 MED ORDER — VANCOMYCIN HCL IN DEXTROSE 1-5 GM/200ML-% IV SOLN: 1000.0000 mg | Freq: Once | INTRAVENOUS | Status: DC

## 2015-01-08 MED ORDER — SODIUM CHLORIDE 0.9 % IJ SOLN
3.0000 mL | Freq: Two times a day (BID) | INTRAMUSCULAR | Status: DC
  Administered 2015-01-08 – 2015-01-15 (×10): 3 mL via INTRAVENOUS

## 2015-01-08 MED ORDER — NITROGLYCERIN 0.4 MG SL SUBL: 0.4000 mg | SUBLINGUAL_TABLET | SUBLINGUAL | Status: DC | PRN

## 2015-01-08 MED ORDER — FUROSEMIDE 10 MG/ML IJ SOLN
20.0000 mg | Freq: Once | INTRAMUSCULAR | Status: AC
  Administered 2015-01-08: 20 mg via INTRAVENOUS
  Filled 2015-01-08: qty 2

## 2015-01-08 NOTE — ED Notes (Signed)
Attempted report 

## 2015-01-08 NOTE — ED Notes (Signed)
Attempted report at this time. Nurse to call back.

## 2015-01-08 NOTE — ED Notes (Signed)
Pt presents with report of shortness of breath, especially with exertion.  Pt has home health nurse that reported to daughter that pt's BP: 80/50 at 1000 today.  Daughter reports pt's lasix was decreased x 2 weeks ago, pt reports BLE edema with "bluish" looking R foot.

## 2015-01-08 NOTE — ED Notes (Signed)
Ambulated pt with pulse ox pt saturations remained at 95% on room air

## 2015-01-08 NOTE — ED Notes (Signed)
Taken to the floor at this time. 

## 2015-01-08 NOTE — ED Notes (Addendum)
Admitting MD at bedside, meal tray has been ordered for patient.

## 2015-01-08 NOTE — H&P (Signed)
[facility] History and Physical  Thomas [name] FMB:[id number] DOB: 12-Feb-1934 DOA: 01/08/2015   Referring physician: Ezequiel Essex, MD. Primary cardiologist:  Daneen Schick, M.D. PCP: Madaline Brilliant, MD   Chief Complaint: Shortness of breath.  HPI: Thomas [name] is a 81 y.o. male with a past medical history of hypertension, CAD, status post biventricular cardiac pacemaker insertion, hypothyroidism, BPH,  Parkinson's disease who comes to the ER with a history of progressively worse dyspnea for about a week, particularly on exertion. This is associated with worsening edema of the lower extremities, decreased appetite, fatigue and some trouble sleeping. He denies chest pain, dizziness, diaphoresis, palpitations, PND, or orthopnea.  Workup has been inconclusive in the ER and Dr. Kingsley Callander spoke to the patient's cardiologist, Dr. Daneen Schick, who did not have any other specific concerns as per the dyspnea being at the results of a CHF exacerbation. He is currently in no acute distress.  Review of Systems:  Constitutional:  No weight loss, night sweats, Fevers, chills, fatigue.  HEENT:  Occasional headaches, Difficulty swallowing,Tooth/dental problems,Sore throat,  No sneezing, itching, ear ache, nasal congestion, post nasal drip,  Cardio-vascular:  Positive swelling in lower extremities. No chest pain, Orthopnea, PND,  anasarca, dizziness, palpitations  GI:  No heartburn, indigestion, abdominal pain, nausea, vomiting, diarrhea, change in bowel habits, loss of appetite  Resp:  Positive shortness of breath with exertion or at rest.  No excess mucus, no productive cough, No non-productive cough, No coughing up of blood.No change in color of mucus.No wheezing.No chest wall deformity  Skin:  no rash or lesions.  GU:  no dysuria, change in color of urine, no urgency or frequency. No flank pain.  Musculoskeletal:  No joint pain or swelling. No decreased range of motion. No back pain.    Psych:  No change in mood or affect. No depression or anxiety.   Past Medical History  Diagnosis Date  . Coronary artery disease     80% distal RCA stenosis.  . Thyroid disease     HYPOTHYROIDISM  . Nontoxic multinodular goiter   . Psychosexual dysfunction with inhibited sexual excitement     ERECTILE DYSFUNCTION  . Hypertrophy of prostate without urinary obstruction and other lower urinary tract symptoms (LUTS)     BPH  . Hypertension   . Wide-complex tachycardia   . Parkinson's disease   . Status post biventricular cardiac pacemaker insertion     Status post CRT-D.   Past Surgical History  Procedure Laterality Date  . Cardiac catheterization    . Cardioversion N/A 06/03/2014    Procedure: CARDIOVERSION;  Surgeon: Thompson Grayer, MD;  Location[facility] C[facility] LAB;  Service: Cardiovascular;  Laterality: N/A;  . Left heart catheterization with coronary angiogram N/A 06/10/2014    Procedure: LEFT HEART CATHETERIZATION WITH CORONARY ANGIOGRAM;  Surgeon: Sinclair Grooms, MD;  Location: [facility] CATH LAB;  Service: Cardiovascular;  Laterality: N/A;  . Abdominal angiogram  06/10/2014    Procedure: ABDOMINAL ANGIOGRAM;  Surgeon: Sinclair Grooms, MD;  Location: [facility] CATH LAB;  Service: Cardiovascular;;  . Percutaneous coronary stent intervention (pci-s) N/A 06/12/2014    Procedure: PERCUTANEOUS CORONARY STENT INTERVENTION (PCI-S);  Surgeon: Sinclair Grooms, MD;  Location: [facility] CATH LAB;  Service: Cardiovascular;  Laterality: N/A;  . Percutaneous coronary rotoblator intervention (pci-r)  06/12/2014    Procedure: PERCUTANEOUS CORONARY ROTOBLATOR INTERVENTION (PCI-R);  Surgeon: Sinclair Grooms, MD;  Location: [facility] CATH LAB;  Service: Cardiovascular;;  . Cardiac catheterization  06/12/2014  Procedure: CORONARY STENT INTERVENTION W/IMPELLA;  Surgeon: Sinclair Grooms, MD;  Location: [facility] CATH LAB;  Service: Cardiovascular;;   Social History:  reports that he quit smoking about 19 years ago. His smoking  use included Cigarettes. He has a 45 pack-year smoking history. He has never used smokeless tobacco. He reports that he does not drink alcohol or use illicit drugs.  No Known Allergies  Family History  Problem Relation Age of Onset  . Heart attack Mother   . Thyroid disease Neg Hx   . Coronary artery disease Other     Prior to Admission medications   Medication Sig Start Date End Date Taking? Authorizing Provider  amantadine (SYMMETREL) 100 MG capsule Take 100 mg by mouth 2 (two) times daily.    Yes Historical Provider, MD  amiodarone (PACERONE) 200 MG tablet Take 1 tablet (200 mg total) by mouth daily. 12/17/14  Yes Belva Crome, MD  aspirin EC 81 MG EC tablet Take 1 tablet (81 mg total) by mouth daily. 06/15/14  Yes Almyra Deforest, PA  clopidogrel (PLAVIX) 75 MG tablet Take 1 tablet (75 mg total) by mouth daily. 12/11/14  Yes Belva Crome, MD  DULoxetine (CYMBALTA) 30 MG capsule Take 30 mg by mouth every other day.    Yes Historical Provider, MD  furosemide (LASIX) 40 MG tablet Take 1 tablet (40 mg total) by mouth every Monday, Wednesday, and Friday. 12/04/14  Yes Belva Crome, MD  metoprolol succinate (TOPROL-XL) 12.5 mg TB24 24 hr tablet Take 0.5 tablets (12.5 mg total) by mouth daily. 06/15/14  Yes Almyra Deforest, PA  Multiple Vitamins-Minerals (ICAPS MV PO) Take 2 by mouth twice daily    Yes Historical Provider, MD  nitroGLYCERIN (NITROSTAT) 0.4 MG SL tablet Place 0.4 mg under the tongue every 5 (five) minutes as needed.     Yes Historical Provider, MD  Omega-3 Fatty Acids (FISH OIL) 1000 MG CAPS Take one by mouth twice daily     Yes Historical Provider, MD  omeprazole (PRILOSEC) 20 MG capsule Take 1 capsule (20 mg total) by mouth daily. 07/03/14  Yes Belva Crome, MD  potassium chloride SA (K-DUR,KLOR-CON) 20 MEQ tablet Take 20 mEq by mouth daily. 11/07/14  Yes Historical Provider, MD  pramipexole (MIRAPEX) 0.5 MG tablet Take 0.5 mg by mouth 3 (three) times daily. 12/09/14  Yes Historical Provider, MD    rotigotine (NEUPRO) 6 MG/24HR Place 1 patch onto the skin daily.   Yes Historical Provider, MD  vitamin C (ASCORBIC ACID) 500 MG tablet Take 500 mg by mouth daily.    Yes Historical Provider, MD  simvastatin (ZOCOR) 20 MG tablet Take 1 tablet (20 mg total) by mouth at bedtime. Patient not taking: Reported on 01/08/2015 11/09/12   Donney Dice, PA-C   Physical Exam: Filed Vitals:   01/08/15 1615 01/08/15 1730 01/08/15 1815 01/08/15 1830  BP: 141/68 136/69 107/52 127/71  Pulse: 56 62 46 62  Temp:      TempSrc:      Resp: 15 15 20 18   SpO2: 100% 97% 93% 98%    Wt Readings from Last 3 Encounters:  12/03/14 83.825 kg (184 lb 12.8 oz)  08/12/14 89.268 kg (196 lb 12.8 oz)  06/23/14 90.266 kg (199 lb)    General:  Appears calm and comfortable Eyes: PERRL, normal lids, irises & conjunctiva ENT: grossly normal hearing, lips & tongue Neck: no LAD, masses or thyromegaly Cardiovascular: RRR, no m/r/g. Bilateral LE edema and mild erythema  right more than left. Telemetry: SR, no arrhythmias  Respiratory: CTA bilaterally, no w/r/r. Normal respiratory effort. Abdomen: soft, ntnd, no organomegaly no guarding no rebound tenderness. Skin: no rash or induration seen on limited exam Musculoskeletal: grossly normal tone BUE/BLE Psychiatric: grossly normal mood and affect, speech fluent and appropriate Neurologic: grossly non-focal.          Labs on Admission:  Basic Metabolic Panel:  Recent Labs Lab 01/08/15 1519  NA 140  K 4.5  CL 102  CO2 29  GLUCOSE 96  BUN 31  CREATININE 2.35  CALCIUM 9.2   Liver Function Tests: No results for input(s): AST, ALT, ALKPHOS, BILITOT, PROT, ALBUMIN in the last 168 hours. No results for input(s): LIPASE, AMYLASE in the last 168 hours. No results for input(s): AMMONIA in the last 168 hours. CBC:  Recent Labs Lab 01/08/15 1519  WBC 6.8  HGB 12.0  HCT 36.8  MCV 87.8  PLT 189   Cardiac Enzymes:  Recent Labs Lab 01/08/15 1519  TROPONINI  0.03    BNP (last 3 results)  Recent Labs  06/13/14 1038 01/08/15 1519  BNP 740.2 373.8    ProBNP (last 3 results)  Recent Labs  08/12/14 1254 12/03/14 1557  PROBNP 400.0 375.0    CBG: No results for input(s): GLUCAP in the last 168 hours.  Radiological Exams on Admission: Dg Chest 2 View  01/08/2015   CLINICAL DATA:  Shortness of breath for 2 months  EXAM: CHEST  2 VIEW  COMPARISON:  Nov 04, 2014  FINDINGS: The heart size and mediastinal contours are stable. The heart size is enlarged. The aorta is tortuous. Cardiac pacemaker is unchanged. There is central pulmonary vascular congestion without frank edema. There is no focal pneumonia or pleural effusion. Right calcified pleural plaque is unchanged. The visualized skeletal structures are stable.  IMPRESSION: Cardiomegaly and central pulmonary vascular congestion unchanged compared to prior exam. No focal pneumonia.   Electronically Signed   By: Abelardo Diesel M.D.   On: 01/08/2015 16:57   Ct Head Wo Contrast  01/08/2015   CLINICAL DATA:  Confusion, recent fall, altered mental status.  EXAM: CT HEAD WITHOUT CONTRAST  TECHNIQUE: Contiguous axial images were obtained from the base of the skull through the vertex without contrast.  COMPARISON:  None  FINDINGS: Diffuse brain atrophy and chronic white matter microvascular ischemic changes throughout the cerebral hemispheres. Remote right occipital infarct with encephalomalacia. No acute intracranial hemorrhage, definite acute infarction, mass lesion, midline shift, herniation, hydrocephalus, or extra-axial fluid collection. Cisterns are patent. Cerebellar atrophy as well. Atherosclerosis of the intracranial vessels at the skullbase. Orbits are symmetric. Mastoids and sinuses remain clear.  IMPRESSION: Atrophy, chronic white matter ischemic change, and remote right occipital PCA territory infarct.  Intracranial atherosclerosis  No acute intracranial finding by noncontrast CT.   Electronically  Signed   By: Jerilynn Mages.  Shick M.D.   On: 01/08/2015 16:37     Echocardiogram from 06/02/2014.  - Left ventricle: The cavity size was normal. Wall thickness was increased in a pattern of mild LVH. There was mild focal basal hypertrophy of the septum. Systolic function was severely reduced. The estimated ejection fraction was in the range of 25% to 30%. Diffuse hypokinesis. - Mitral valve: Calcified annulus. There was mild regurgitation. - Left atrium: The atrium was moderately dilated. - Pulmonary arteries: Systolic pressure was mildly increased. PA peak pressure: 39 mm Hg (S).  Impressions:  - Severe global reduction in LV function; moderate LAE; mild MR; mildly  elevated pulmonary pressures.  EKG: Independently reviewed. AV dual-paced rhythm with Premature atrial complexes Biventricular pacemaker detected Abnormal ECG  Vent. rate 65 BPM PR interval 156 ms QRS duration 172 ms QT/QTc 516/536 ms P-R-T axes * 255 80  Assessment/Plan Principal Problem:   Shortness of breath dyspnea Active Problems:   Essential hypertension   Non-ischemic cardiomyopathy-EF 25-30%   CAD-single vessel (RCA) in 2012   CAD (coronary artery disease), native coronary artery   Parkinson disease   Worsening renal function   Will admit for telemetry monitoring, serial troponin levels and rule out PE with VQ scan. Cardiology was informally consulted by the ED, and they did not have any other specific concerns. I doubt that the lower extremities redness is cellulitis, since there is no tenderness, calor or generalized symptoms of fever fatigue or malaise and the patient has a normal white blood cell count. Continue regular medications.  Code Status: Full DVT Prophylaxis: Lovenox SQ. Family CommunicationVito Backers Daughter [phone]  [phone]  Disposition Plan: Discharge home  Time spent: Over 70 minutes  Reubin Milan [facility] Pager [phone].

## 2015-01-08 NOTE — ED Provider Notes (Signed)
CSN: [id number]     Arrival date & time 01/08/15  1432 History   None    No chief complaint on file.    (Consider location/radiation/quality/duration/timing/severity/associated sxs/prior Treatment) HPI Comments: Patient with one week history of progressively worsening shortness of breath worse with exertion. Associated with nonproductive cough. Denies chest pain or fever. Patient with history of CAD s/p Stent, ischemic cardiomyopathy witf EF 25 %, on lasix MWF 40 mg. Daughter reports worsening swelling to legs with increased congestion and decreased appetite and mobility. Hx parkinson's, AICD, HTN. No chest pain, abdominal pain, headache, nausea, vomiting.  The history is provided by the patient and a relative.    Past Medical History  Diagnosis Date  . Coronary artery disease     80% distal RCA stenosis.  . Thyroid disease     HYPOTHYROIDISM  . Nontoxic multinodular goiter   . Psychosexual dysfunction with inhibited sexual excitement     ERECTILE DYSFUNCTION  . Hypertrophy of prostate without urinary obstruction and other lower urinary tract symptoms (LUTS)     BPH  . Hypertension   . Wide-complex tachycardia   . Parkinson's disease   . Status post biventricular cardiac pacemaker insertion     Status post CRT-D.   Past Surgical History  Procedure Laterality Date  . Cardiac catheterization    . Cardioversion N/A 06/03/2014    Procedure: CARDIOVERSION;  Surgeon: Thompson Grayer, MD;  Location: [facility] CATH LAB;  Service: Cardiovascular;  Laterality: N/A;  . Left heart catheterization with coronary angiogram N/A 06/10/2014    Procedure: LEFT HEART CATHETERIZATION WITH CORONARY ANGIOGRAM;  Surgeon: Sinclair Grooms, MD;  Location: [facility] CATH LAB;  Service: Cardiovascular;  Laterality: N/A;  . Abdominal angiogram  06/10/2014    Procedure: ABDOMINAL ANGIOGRAM;  Surgeon: Sinclair Grooms, MD;  Location: [facility] CATH LAB;  Service: Cardiovascular;;  . Percutaneous coronary stent intervention  (pci-s) N/A 06/12/2014    Procedure: PERCUTANEOUS CORONARY STENT INTERVENTION (PCI-S);  Surgeon: Sinclair Grooms, MD;  Location: [facility] CATH LAB;  Service: Cardiovascular;  Laterality: N/A;  . Percutaneous coronary rotoblator intervention (pci-r)  06/12/2014    Procedure: PERCUTANEOUS CORONARY ROTOBLATOR INTERVENTION (PCI-R);  Surgeon: Sinclair Grooms, MD;  Location: [facility] CATH LAB;  Service: Cardiovascular;;  . Cardiac catheterization  06/12/2014    Procedure: CORONARY STENT INTERVENTION W/IMPELLA;  Surgeon: Sinclair Grooms, MD;  Location: [facility] CATH LAB;  Service: Cardiovascular;;   Family History  Problem Relation Age of Onset  . Heart attack Mother   . Thyroid disease Neg Hx   . Coronary artery disease Other    History  Substance Use Topics  . Smoking status: Former Smoker -- 1.00 packs/day for 45 years    Types: Cigarettes    Quit date: 06/14/1995  . Smokeless tobacco: Never Used  . Alcohol Use: No    Review of Systems  Constitutional: Positive for activity change, appetite change and fatigue. Negative for fever.  HENT: Negative for congestion and rhinorrhea.   Respiratory: Positive for cough and shortness of breath. Negative for chest tightness.   Cardiovascular: Positive for leg swelling. Negative for chest pain.  Gastrointestinal: Negative for nausea, vomiting and abdominal pain.  Genitourinary: Negative for dysuria and testicular pain.  Musculoskeletal: Negative for myalgias and arthralgias.  Skin: Negative for rash.  Neurological: Negative for dizziness, weakness, light-headedness, numbness and headaches.  A complete 10 system review of systems was obtained and all systems are negative except as noted in the HPI and  PMH.      Allergies  Review of patient's allergies indicates no known allergies.  Home Medications   Prior to Admission medications   Medication Sig Start Date End Date Taking? Authorizing Provider  amantadine (SYMMETREL) 100 MG capsule Take 100 mg by  mouth 2 (two) times daily.    Yes Historical Provider, MD  amiodarone (PACERONE) 200 MG tablet Take 1 tablet (200 mg total) by mouth daily. 12/17/14  Yes Belva Crome, MD  aspirin EC 81 MG EC tablet Take 1 tablet (81 mg total) by mouth daily. 06/15/14  Yes Almyra Deforest, PA  clopidogrel (PLAVIX) 75 MG tablet Take 1 tablet (75 mg total) by mouth daily. 12/11/14  Yes Belva Crome, MD  DULoxetine (CYMBALTA) 30 MG capsule Take 30 mg by mouth every other day.    Yes Historical Provider, MD  furosemide (LASIX) 40 MG tablet Take 1 tablet (40 mg total) by mouth every Monday, Wednesday, and Friday. 12/04/14  Yes Belva Crome, MD  metoprolol succinate (TOPROL-XL) 12.5 mg TB24 24 hr tablet Take 0.5 tablets (12.5 mg total) by mouth daily. 06/15/14  Yes Almyra Deforest, PA  Multiple Vitamins-Minerals (ICAPS MV PO) Take 2 by mouth twice daily    Yes Historical Provider, MD  nitroGLYCERIN (NITROSTAT) 0.4 MG SL tablet Place 0.4 mg under the tongue every 5 (five) minutes as needed.     Yes Historical Provider, MD  Omega-3 Fatty Acids (FISH OIL) 1000 MG CAPS Take one by mouth twice daily     Yes Historical Provider, MD  omeprazole (PRILOSEC) 20 MG capsule Take 1 capsule (20 mg total) by mouth daily. 07/03/14  Yes Belva Crome, MD  potassium chloride SA (K-DUR,KLOR-CON) 20 MEQ tablet Take 20 mEq by mouth daily. 11/07/14  Yes Historical Provider, MD  pramipexole (MIRAPEX) 0.5 MG tablet Take 0.5 mg by mouth 3 (three) times daily. 12/09/14  Yes Historical Provider, MD  rotigotine (NEUPRO) 6 MG/24HR Place 1 patch onto the skin daily.   Yes Historical Provider, MD  vitamin C (ASCORBIC ACID) 500 MG tablet Take 500 mg by mouth daily.    Yes Historical Provider, MD  simvastatin (ZOCOR) 20 MG tablet Take 1 tablet (20 mg total) by mouth at bedtime. Patient not taking: Reported on 01/08/2015 11/09/12   Burna Forts Serpe, PA-C   BP 140/63 mmHg  Pulse 60  Temp(Src) 98.7 F (37.1 C) (Oral)  Resp 18  Ht 6' (1.829 m)  Wt 186 lb 4.6 oz (84.5 kg)  BMI  25.26 kg/m2  SpO2 100% Physical Exam  Constitutional: He is oriented to person, place, and time. He appears well-developed and well-nourished. No distress.  HENT:  Head: Normocephalic and atraumatic.  Mouth/Throat: Oropharynx is clear and moist. No oropharyngeal exudate.  Eyes: Conjunctivae and EOM are normal. Pupils are equal, round, and reactive to light.  Neck: Normal range of motion. Neck supple.  No meningismus.  Cardiovascular: Normal rate, regular rhythm, normal heart sounds and intact distal pulses.   No murmur heard. Pulmonary/Chest: Effort normal and breath sounds normal. No respiratory distress.  Mildly increased work of breathing, decreased breath sounds at bases.  Abdominal: Soft. There is no tenderness. There is no rebound and no guarding.  Musculoskeletal: Normal range of motion. He exhibits edema and tenderness.  +2 pretibial edema with erythematous skin changes.  Intact DP and PT pulses.  Neurological: He is alert and oriented to person, place, and time. No cranial nerve deficit. He exhibits normal muscle tone. Coordination normal.  No ataxia on finger to nose bilaterally. No pronator drift. 5/5 strength throughout. CN 2-12 intact.  Equal grip strength. Sensation intact.  Skin: Skin is warm.  Psychiatric: He has a normal mood and affect. His behavior is normal.  Nursing note and vitals reviewed.   ED Course  Procedures (including critical care time) Labs Review Labs Reviewed  BASIC METABOLIC PANEL - Abnormal; Notable for the following:    BUN 31 (*)    Creatinine, Ser 2.35 (*)    GFR calc non Af Amer 25 (*)    GFR calc Af Amer 29 (*)    All other components within normal limits  CBC - Abnormal; Notable for the following:    RBC 4.19 (*)    Hemoglobin 12.0 (*)    HCT 36.8 (*)    All other components within normal limits  BRAIN NATRIURETIC PEPTIDE - Abnormal; Notable for the following:    B Natriuretic Peptide 373.8 (*)    All other components within normal  limits  BRAIN NATRIURETIC PEPTIDE - Abnormal; Notable for the following:    B Natriuretic Peptide 538.7 (*)    All other components within normal limits  CBC - Abnormal; Notable for the following:    RBC 3.87 (*)    Hemoglobin 10.9 (*)    HCT 34.0 (*)    Platelets 148 (*)    All other components within normal limits  CREATININE, SERUM - Abnormal; Notable for the following:    Creatinine, Ser 2.13 (*)    GFR calc non Af Amer 28 (*)    GFR calc Af Amer 32 (*)    All other components within normal limits  PROTIME-INR  TROPONIN I  URINALYSIS, ROUTINE W REFLEX MICROSCOPIC (NOT AT [facility])  TROPONIN I  TROPONIN I  COMPREHENSIVE METABOLIC PANEL  CBC  I-STAT TROPOININ, ED    Imaging Review Dg Chest 2 View  01/08/2015   CLINICAL DATA:  Shortness of breath for 2 months  EXAM: CHEST  2 VIEW  COMPARISON:  Nov 04, 2014  FINDINGS: The heart size and mediastinal contours are stable. The heart size is enlarged. The aorta is tortuous. Cardiac pacemaker is unchanged. There is central pulmonary vascular congestion without frank edema. There is no focal pneumonia or pleural effusion. Right calcified pleural plaque is unchanged. The visualized skeletal structures are stable.  IMPRESSION: Cardiomegaly and central pulmonary vascular congestion unchanged compared to prior exam. No focal pneumonia.   Electronically Signed   By: Abelardo Diesel M.D.   On: 01/08/2015 16:57   Ct Head Wo Contrast  01/08/2015   CLINICAL DATA:  Confusion, recent fall, altered mental status.  EXAM: CT HEAD WITHOUT CONTRAST  TECHNIQUE: Contiguous axial images were obtained from the base of the skull through the vertex without contrast.  COMPARISON:  None  FINDINGS: Diffuse brain atrophy and chronic white matter microvascular ischemic changes throughout the cerebral hemispheres. Remote right occipital infarct with encephalomalacia. No acute intracranial hemorrhage, definite acute infarction, mass lesion, midline shift, herniation,  hydrocephalus, or extra-axial fluid collection. Cisterns are patent. Cerebellar atrophy as well. Atherosclerosis of the intracranial vessels at the skullbase. Orbits are symmetric. Mastoids and sinuses remain clear.  IMPRESSION: Atrophy, chronic white matter ischemic change, and remote right occipital PCA territory infarct.  Intracranial atherosclerosis  No acute intracranial finding by noncontrast CT.   Electronically Signed   By: Jerilynn Mages.  Shick M.D.   On: 01/08/2015 16:37   Nm Pulmonary Perf And Vent  01/08/2015   CLINICAL DATA:  Shortness of breath.  EXAM: NUCLEAR MEDICINE VENTILATION - PERFUSION LUNG SCAN  TECHNIQUE: Ventilation images were obtained in multiple projections using inhaled aerosol Tc-44m DTPA. Perfusion images were obtained in multiple projections after intravenous injection of Tc-12m MAA.  RADIOPHARMACEUTICALS:  40 mCi of Technetium-20m DTPA aerosol inhalation and 6 mCi of Technetium-53m MAA IV  COMPARISON:  Chest x-ray dated 01/08/2015  FINDINGS: Ventilation: No focal ventilation defect.  Perfusion: No wedge shaped peripheral perfusion defects to suggest acute pulmonary embolism.  IMPRESSION: Normal ventilation-perfusion lung scan.   Electronically Signed   By: Lorriane Shire M.D.   On: 01/08/2015 19:54     EKG Interpretation   Date/Time:  Thursday January 08 2015 14:46:39 EDT Ventricular Rate:  65 PR Interval:  156 QRS Duration: 172 QT Interval:  516 QTC Calculation: 536 R Axis:   -105 Text Interpretation:  AV dual-paced rhythm with Premature atrial complexes  Biventricular pacemaker detected Abnormal ECG Nonspecific ST and T wave  abnormality Confirmed by Amiri Riechers  MD, [place] [phone]) on 01/08/2015 3:17:29  PM      MDM   Final diagnoses:  SOB (shortness of breath)   One week of exertional SOB with nonproductive cough. No fever. No chest pain.  Paced EKG.  No hypoxia, lungs relatively clear.  CXR with stable vascular congestion. Creatinine elevated from baseline but stable  from last month.  Dr. Tamala Julian has seen patient and does not feel CHF exacerbation as primary source of dyspnea. He is able to lay flat and has no increased work of breathing. Ambulatory without desaturation. Dr. Tamala Julian feels some concern for cellulitis but no fever or WBC elevation.  D/w Dr. Olevia Bowens.  Will check VQ scan.  Ezequiel Essex, MD 01/09/15 [phone]

## 2015-01-08 NOTE — Consult Note (Signed)
Name: Thomas [name] is a 81 y.o. male Admit date: 01/08/2015 Referring Physician:  Wyvonnia Dusky, MD Primary Physician:  Madaline Brilliant, M.D. Primary Cardiologist:  [facility] Blenda Bridegroom, M.D.  Reason for Consultation:  Possible CHF  ASSESSMENT:  1. Bilateral lower extremity swelling with erythema and warmth. Rule out cellulitis 2. Chronic systolic heart failure without evidence of pulmonary congestion. Last EF 25-30% in December 2015 3. Coronary artery disease with history of high risk PCI December 2015 with left ventricular assistive device support at which time he received a proximal circumflex and mid LAD stent. 4. Parkinson's disease 5. Recurrent syncope/near syncope 6. Orthostatic hypotension possibly related to parkinsonism (? [place]). 7. Chronic kidney disease stage IV 8. History of proximal atrial fibrillation, with rhythm control on amiodarone therapy 9. Biventricular pacemaker for chronic systolic heart failure 10. Essential hypertension 11. Weakness and inability to care for himself at home with recent falls posing a risk to the patient's immediate outcome  PLAN:  1. Probably needs to have bilateral lower extremity Doppler studies to exclude DVT. 2. Treat for possible cellulitis involving the lower extremities 3. Check BNP along with chest x-ray to determine if there is any significant component of pulmonary congestion. This is doubtful as the patient is able to lie flat and have normal O2 saturations. 4. May need to consider alternative living circumstances and a nursing home, rehabilitation center, all with his daughter. 5. Consider neuro consultation to determine if recent gradual decline is related to progression of Parkinson's disease.    HPI: 81 year old gentleman with history of coronary artery disease, chronic systolic heart failure, Parkinson's disease, chronic kidney disease, high risk PCI with LAD and circumflex DES in December 2016, paroxysmal atrial fibrillation  with recent rhythm control on amiodarone.  Seen in our office approximately one month ago with lower extremity swelling and complain of dyspnea. At that time the patient was taking torsemide 40 mg as needed (several times per week) if he felt short of breath. He was reluctant to take the medication regularly because it made him feel bad. Laboratory data demonstrated kidney impairment with an estimated GFR of 30. We switched the patient to furosemide 40 mg on Monday, Wednesday, and Friday. According to the daughter, although this diuretic regimen he is gradually gaining weight and he complains of more shortness of breath. He denies chills and fever. He denies chest pain. He has not needed take nitroglycerin. He is on aspirin and Plavix because of relatively recent stent implantation.  PMH:   Past Medical History  Diagnosis Date  . Coronary artery disease     80% distal RCA stenosis.  . Thyroid disease     HYPOTHYROIDISM  . Nontoxic multinodular goiter   . Psychosexual dysfunction with inhibited sexual excitement     ERECTILE DYSFUNCTION  . Hypertrophy of prostate without urinary obstruction and other lower urinary tract symptoms (LUTS)     BPH  . Hypertension   . Wide-complex tachycardia   . Parkinson's disease   . Status post biventricular cardiac pacemaker insertion     Status post CRT-D.    PSH:   Past Surgical History  Procedure Laterality Date  . Cardiac catheterization    . Cardioversion N/A 06/03/2014    Procedure: CARDIOVERSION;  Surgeon: Thompson Grayer, MD;  Location: [facility] CATH LAB;  Service: Cardiovascular;  Laterality: N/A;  . Left heart catheterization with coronary angiogram N/A 06/10/2014    Procedure: LEFT HEART CATHETERIZATION WITH CORONARY ANGIOGRAM;  Surgeon: Sinclair Grooms, MD;  Location:  [place] CATH LAB;  Service: Cardiovascular;  Laterality: N/A;  . Abdominal angiogram  06/10/2014    Procedure: ABDOMINAL ANGIOGRAM;  Surgeon: Sinclair Grooms, MD;  Location: [facility] CATH LAB;   Service: Cardiovascular;;  . Percutaneous coronary stent intervention (pci-s) N/A 06/12/2014    Procedure: PERCUTANEOUS CORONARY STENT INTERVENTION (PCI-S);  Surgeon: Sinclair Grooms, MD;  Location: [facility] CATH LAB;  Service: Cardiovascular;  Laterality: N/A;  . Percutaneous coronary rotoblator intervention (pci-r)  06/12/2014    Procedure: PERCUTANEOUS CORONARY ROTOBLATOR INTERVENTION (PCI-R);  Surgeon: Sinclair Grooms, MD;  Location: [facility] CATH LAB;  Service: Cardiovascular;;  . Cardiac catheterization  06/12/2014    Procedure: CORONARY STENT INTERVENTION W/IMPELLA;  Surgeon: Sinclair Grooms, MD;  Location: [facility] CATH LAB;  Service: Cardiovascular;;   Allergies:  Review of patient's allergies indicates no known allergies. Prior to Admit Meds:   (Not in a hospital admission) Fam HX:    Family History  Problem Relation Age of Onset  . Heart attack Mother   . Thyroid disease Neg Hx   . Coronary artery disease Other    Social HX:    History   Social History  . Marital Status: Widowed    Spouse Name: N/A  . Number of Children: N/A  . Years of Education: N/A   Occupational History  . RETIRED    Social History Main Topics  . Smoking status: Former Smoker -- 1.00 packs/day for 45 years    Types: Cigarettes    Quit date: 06/14/1995  . Smokeless tobacco: Never Used  . Alcohol Use: No  . Drug Use: No  . Sexual Activity: Not on file   Other Topics Concern  . Not on file   Social History Narrative     Review of Systems: Dyspnea since December. Denies palpitations. Has not had chest pain. Difficulty with ambulation. Recent falls. The daughter is concerned about a recent out of systems CT scan that suggested an old stroke.  Physical Exam: Blood pressure 137/76, pulse 59, temperature 98.1 F (36.7 C), temperature source Oral, resp. rate 16, SpO2 96 %. Weight change:   Lying flat. Tremor present. HEENT exam reveals no jaundice or pallor Neck exam reveals no JVD with the patient  lying at 40 Chest is clear with exception of faint basilar rales Cardiac exam reveals irregular rhythm. No gallop. Abdomen is nontender and soft. Extremities reveal bilateral erythema with 2+ edema from ankles to the knees. Lower extremities are warm. Neuro exam reveals a tremor, weakness and rigidity, but otherwise nonfocal. Labs: Lab Results  Component Value Date   WBC 6.8 01/08/2015   HGB 12.0* 01/08/2015   HCT 36.8* 01/08/2015   MCV 87.8 01/08/2015   PLT 189 01/08/2015    Recent Labs Lab 01/08/15 1519  NA 140  K 4.5  CL 102  CO2 29  BUN 31*  CREATININE 2.35*  CALCIUM 9.2  GLUCOSE 96   No results found for: PTT Lab Results  Component Value Date   INR 1.10 01/08/2015   INR 1.23 06/15/2014   INR 1.31 06/14/2014   Lab Results  Component Value Date   TROPONINI 0.03 01/08/2015    No results found for: CHOL No results found for: HDL No results found for: LDLCALC No results found for: TRIG No results found for: CHOLHDL No results found for: LDLDIRECT    Radiology:  No results found.  EKG:  AV sequential pacing with no acute changes    Sinclair Grooms 01/08/2015  4:19 PM

## 2015-01-09 ENCOUNTER — Ambulatory Visit (HOSPITAL_COMMUNITY): Payer: Medicare Other

## 2015-01-09 DIAGNOSIS — I5023 Acute on chronic systolic (congestive) heart failure: Secondary | ICD-10-CM | POA: Diagnosis present

## 2015-01-09 DIAGNOSIS — G20 Parkinson's disease: Secondary | ICD-10-CM | POA: Diagnosis present

## 2015-01-09 DIAGNOSIS — G9340 Encephalopathy, unspecified: Secondary | ICD-10-CM | POA: Diagnosis present

## 2015-01-09 DIAGNOSIS — F329 Major depressive disorder, single episode, unspecified: Secondary | ICD-10-CM | POA: Diagnosis present

## 2015-01-09 DIAGNOSIS — I480 Paroxysmal atrial fibrillation: Secondary | ICD-10-CM | POA: Diagnosis present

## 2015-01-09 DIAGNOSIS — I129 Hypertensive chronic kidney disease with stage 1 through stage 4 chronic kidney disease, or unspecified chronic kidney disease: Secondary | ICD-10-CM | POA: Diagnosis present

## 2015-01-09 DIAGNOSIS — K219 Gastro-esophageal reflux disease without esophagitis: Secondary | ICD-10-CM | POA: Diagnosis present

## 2015-01-09 DIAGNOSIS — Z955 Presence of coronary angioplasty implant and graft: Secondary | ICD-10-CM | POA: Diagnosis not present

## 2015-01-09 DIAGNOSIS — R0602 Shortness of breath: Secondary | ICD-10-CM | POA: Diagnosis present

## 2015-01-09 DIAGNOSIS — N400 Enlarged prostate without lower urinary tract symptoms: Secondary | ICD-10-CM | POA: Diagnosis present

## 2015-01-09 DIAGNOSIS — R404 Transient alteration of awareness: Secondary | ICD-10-CM | POA: Diagnosis not present

## 2015-01-09 DIAGNOSIS — I951 Orthostatic hypotension: Secondary | ICD-10-CM | POA: Diagnosis present

## 2015-01-09 DIAGNOSIS — Z8249 Family history of ischemic heart disease and other diseases of the circulatory system: Secondary | ICD-10-CM | POA: Diagnosis not present

## 2015-01-09 DIAGNOSIS — Z87891 Personal history of nicotine dependence: Secondary | ICD-10-CM | POA: Diagnosis not present

## 2015-01-09 DIAGNOSIS — I2510 Atherosclerotic heart disease of native coronary artery without angina pectoris: Secondary | ICD-10-CM | POA: Diagnosis present

## 2015-01-09 DIAGNOSIS — E039 Hypothyroidism, unspecified: Secondary | ICD-10-CM | POA: Diagnosis present

## 2015-01-09 DIAGNOSIS — N390 Urinary tract infection, site not specified: Secondary | ICD-10-CM | POA: Diagnosis not present

## 2015-01-09 DIAGNOSIS — N184 Chronic kidney disease, stage 4 (severe): Secondary | ICD-10-CM | POA: Diagnosis present

## 2015-01-09 DIAGNOSIS — N183 Chronic kidney disease, stage 3 (moderate): Secondary | ICD-10-CM | POA: Diagnosis not present

## 2015-01-09 DIAGNOSIS — I10 Essential (primary) hypertension: Secondary | ICD-10-CM | POA: Diagnosis not present

## 2015-01-09 DIAGNOSIS — E785 Hyperlipidemia, unspecified: Secondary | ICD-10-CM | POA: Diagnosis present

## 2015-01-09 DIAGNOSIS — G252 Other specified forms of tremor: Secondary | ICD-10-CM | POA: Diagnosis present

## 2015-01-09 DIAGNOSIS — Z950 Presence of cardiac pacemaker: Secondary | ICD-10-CM | POA: Diagnosis not present

## 2015-01-09 DIAGNOSIS — Z7902 Long term (current) use of antithrombotics/antiplatelets: Secondary | ICD-10-CM | POA: Diagnosis not present

## 2015-01-09 DIAGNOSIS — R471 Dysarthria and anarthria: Secondary | ICD-10-CM | POA: Diagnosis not present

## 2015-01-09 DIAGNOSIS — G253 Myoclonus: Secondary | ICD-10-CM | POA: Diagnosis present

## 2015-01-09 DIAGNOSIS — Z7982 Long term (current) use of aspirin: Secondary | ICD-10-CM | POA: Diagnosis not present

## 2015-01-09 DIAGNOSIS — I255 Ischemic cardiomyopathy: Secondary | ICD-10-CM | POA: Diagnosis present

## 2015-01-09 LAB — COMPREHENSIVE METABOLIC PANEL
ALT: 24 U/L (ref 17–63)
AST: 21 U/L (ref 15–41)
Albumin: 3.2 g/dL — ABNORMAL LOW (ref 3.5–5.0)
Alkaline Phosphatase: 74 U/L (ref 38–126)
Anion gap: 7 (ref 5–15)
BUN: 26 mg/dL — AB (ref 6–20)
CO2: 31 mmol/L (ref 22–32)
Calcium: 8.7 mg/dL — ABNORMAL LOW (ref 8.9–10.3)
Chloride: 101 mmol/L (ref 101–111)
Creatinine, Ser: 1.9 mg/dL — ABNORMAL HIGH (ref 0.61–1.24)
GFR calc non Af Amer: 32 mL/min — ABNORMAL LOW (ref >60)
GFR, EST AFRICAN AMERICAN: 37 mL/min — AB (ref >60)
Glucose, Bld: 100 mg/dL — ABNORMAL HIGH (ref 65–99)
Potassium: 3.6 mmol/L (ref 3.5–5.1)
Sodium: 139 mmol/L (ref 135–145)
TOTAL PROTEIN: 6.1 g/dL — AB (ref 6.5–8.1)
Total Bilirubin: 1.1 mg/dL (ref 0.3–1.2)

## 2015-01-09 LAB — CBC
HCT: 34.1 % — ABNORMAL LOW (ref 39.0–52.0)
Hemoglobin: 10.9 g/dL — ABNORMAL LOW (ref 13.0–17.0)
MCH: 28.5 pg (ref 26.0–34.0)
MCHC: 32 g/dL (ref 30.0–36.0)
MCV: 89.3 fL (ref 78.0–100.0)
Platelets: 150 10*3/uL (ref 150–400)
RBC: 3.82 MIL/uL — ABNORMAL LOW (ref 4.22–5.81)
RDW: 15.1 % (ref 11.5–15.5)
WBC: 5.5 10*3/uL (ref 4.0–10.5)

## 2015-01-09 LAB — TROPONIN I: TROPONIN I: 0.03 ng/mL (ref <0.031)

## 2015-01-09 MED ORDER — FUROSEMIDE 10 MG/ML IJ SOLN
20.0000 mg | Freq: Two times a day (BID) | INTRAMUSCULAR | Status: DC
  Administered 2015-01-09 (×2): 20 mg via INTRAVENOUS
  Filled 2015-01-09 (×4): qty 2

## 2015-01-09 MED ORDER — SIMVASTATIN 20 MG PO TABS
20.0000 mg | ORAL_TABLET | Freq: Every day | ORAL | Status: DC
  Administered 2015-01-09 – 2015-01-11 (×3): 20 mg via ORAL
  Filled 2015-01-09 (×4): qty 1

## 2015-01-09 NOTE — Progress Notes (Signed)
[facility] PROGRESS NOTE  Thomas [name] Reason TKW:[id number] DOB: 05-Apr-1934 DOA: 01/08/2015 PCP: Madaline Brilliant, MD  Assessment/Plan: 1-acute on chronic systolic heart failure: EF 25-30% -SOB and swelling improved with IV lasix -strict I's and O's -follow low sodium diet -continue toprol  2- a. Fib: continue pacerone  3-hx of CAD: no CP -troponin neg -continue ASA, plavix, b-blockers and statins  4-HLD: will resume statins  5-hx of parkinson's: will continue mirapex and amantadine   6-depression: cymbalta  7-GERD; continue PPI  8-RLE swelling/redness: no DVT and no true signs of infection appreciated -improved with diuresis -will follow clinical response -hold abx's  9-CKD stage III: stable and in fact better than at baseline after diuresis -will follow BMET  Code Status: Full Family Communication: no family at bedside Disposition Plan: home when CHF exacerbation resolved   Consultants:  None   Procedures:  LE duplex: negative for DVT   V/Q scan: no PE  Antibiotics:  None   HPI/Subjective: No CP, no fever, no nausea, no vomiting. Patient endorses improvement in his breathing and swelling.  Objective: Filed Vitals:   01/09/15 1445  BP: 105/52  Pulse: 60  Temp: 97.9 F (36.6 C)  Resp: 18    Intake/Output Summary (Last 24 hours) at 01/09/15 1729 Last data filed at 01/09/15 9242  Gross per 24 hour  Intake    360 ml  Output   1040 ml  Net   -680 ml   Filed Weights   01/08/15 2017 01/09/15 0700  Weight: 84.5 kg (186 lb 4.6 oz) 83.2 kg (183 lb 6.8 oz)    Exam:   General:  Afebrile, no CP, reporting improvement in his breathing and no pain on his RLE. Patient is AAOX2  Cardiovascular: S1 and S2, no rubs, no gallops, no JVD  Respiratory: fine crackles bibasilar, no wheezing   Abdomen: soft, NT, ND, positive BS  Musculoskeletal: 1+ edema bilaterally, no open wounds; RLE with mild erythema, no pain.  Data Reviewed: Basic Metabolic  Panel:  Recent Labs Lab 01/08/15 1519 01/08/15 2200 01/09/15 0351  NA 140  --  139  K 4.5  --  3.6  CL 102  --  101  CO2 29  --  31  GLUCOSE 96  --  100*  BUN 31*  --  26*  CREATININE 2.35* 2.13* 1.90*  CALCIUM 9.2  --  8.7*   Liver Function Tests:  Recent Labs Lab 01/09/15 0351  AST 21  ALT 24  ALKPHOS 74  BILITOT 1.1  PROT 6.1*  ALBUMIN 3.2*   CBC:  Recent Labs Lab 01/08/15 1519 01/08/15 2200 01/09/15 0351  WBC 6.8 5.6 5.5  HGB 12.0* 10.9* 10.9*  HCT 36.8* 34.0* 34.1*  MCV 87.8 87.9 89.3  PLT 189 148* 150   Cardiac Enzymes:  Recent Labs Lab 01/08/15 1519 01/08/15 2200 01/09/15 0351  TROPONINI 0.03 0.03 0.03   BNP (last 3 results)  Recent Labs  06/13/14 1038 01/08/15 1519 01/08/15 2200  BNP 740.2* 373.8* 538.7*    ProBNP (last 3 results)  Recent Labs  08/12/14 1254 12/03/14 1557  PROBNP 400.0* 375.0*    Studies: Dg Chest 2 View  01/08/2015   CLINICAL DATA:  Shortness of breath for 2 months  EXAM: CHEST  2 VIEW  COMPARISON:  Nov 04, 2014  FINDINGS: The heart size and mediastinal contours are stable. The heart size is enlarged. The aorta is tortuous. Cardiac pacemaker is unchanged. There is central pulmonary vascular congestion without frank edema. There is no  focal pneumonia or pleural effusion. Right calcified pleural plaque is unchanged. The visualized skeletal structures are stable.  IMPRESSION: Cardiomegaly and central pulmonary vascular congestion unchanged compared to prior exam. No focal pneumonia.   Electronically Signed   By: Abelardo Diesel M.D.   On: 01/08/2015 16:57   Ct Head Wo Contrast  01/08/2015   CLINICAL DATA:  Confusion, recent fall, altered mental status.  EXAM: CT HEAD WITHOUT CONTRAST  TECHNIQUE: Contiguous axial images were obtained from the base of the skull through the vertex without contrast.  COMPARISON:  None  FINDINGS: Diffuse brain atrophy and chronic white matter microvascular ischemic changes throughout the  cerebral hemispheres. Remote right occipital infarct with encephalomalacia. No acute intracranial hemorrhage, definite acute infarction, mass lesion, midline shift, herniation, hydrocephalus, or extra-axial fluid collection. Cisterns are patent. Cerebellar atrophy as well. Atherosclerosis of the intracranial vessels at the skullbase. Orbits are symmetric. Mastoids and sinuses remain clear.  IMPRESSION: Atrophy, chronic white matter ischemic change, and remote right occipital PCA territory infarct.  Intracranial atherosclerosis  No acute intracranial finding by noncontrast CT.   Electronically Signed   By: Jerilynn Mages.  Shick M.D.   On: 01/08/2015 16:37   Nm Pulmonary Perf And Vent  01/08/2015   CLINICAL DATA:  Shortness of breath.  EXAM: NUCLEAR MEDICINE VENTILATION - PERFUSION LUNG SCAN  TECHNIQUE: Ventilation images were obtained in multiple projections using inhaled aerosol Tc-26m DTPA. Perfusion images were obtained in multiple projections after intravenous injection of Tc-5m MAA.  RADIOPHARMACEUTICALS:  40 mCi of Technetium-52m DTPA aerosol inhalation and 6 mCi of Technetium-68m MAA IV  COMPARISON:  Chest x-ray dated 01/08/2015  FINDINGS: Ventilation: No focal ventilation defect.  Perfusion: No wedge shaped peripheral perfusion defects to suggest acute pulmonary embolism.  IMPRESSION: Normal ventilation-perfusion lung scan.   Electronically Signed   By: Lorriane Shire M.D.   On: 01/08/2015 19:54    Scheduled Meds: . amantadine  100 mg Oral BID  . amiodarone  200 mg Oral Daily  . aspirin EC  81 mg Oral Daily  . clopidogrel  75 mg Oral Daily  . DULoxetine  30 mg Oral QODAY  . enoxaparin (LOVENOX) injection  40 mg Subcutaneous Q24H  . furosemide  20 mg Intravenous Q12H  . metoprolol succinate  12.5 mg Oral Daily  . pantoprazole  40 mg Oral Daily  . pramipexole  0.5 mg Oral TID  . rotigotine  1 patch Transdermal Daily  . sodium chloride  3 mL Intravenous Q12H  . sodium chloride  3 mL Intravenous Q12H    Continuous Infusions:   Principal Problem:   Shortness of breath dyspnea Active Problems:   Essential hypertension   Non-ischemic cardiomyopathy-EF 25-30%   CAD-single vessel (RCA) in 2012   CAD (coronary artery disease), native coronary artery   Parkinson disease   Worsening renal function   Time spent: 30 minutes   Barton Dubois  [facility] Pager [phone]. If 7PM-7AM, please contact night-coverage at [email or web address], password [facility] 01/09/2015, 5:29 PM  LOS: 0 days

## 2015-01-09 NOTE — Progress Notes (Signed)
*  PRELIMINARY RESULTS* Vascular Ultrasound Right lower extremity venous duplex has been completed.  Preliminary findings: no evidence of DVT  Landry Mellow, RDMS, RVT  01/09/2015, 10:32 AM

## 2015-01-09 NOTE — Evaluation (Signed)
Physical Therapy Evaluation Patient Details Name: Thomas [name] MRN: [id number] DOB: Mar 10, 1934 Today's Date: 01/09/2015   History of Present Illness  81 y.o. male with a past medical history of hypertension, CAD, status post biventricular cardiac pacemaker insertion, hypothyroidism, BPH, Parkinson's disease who comes to the ER with a history of progressively worse dyspnea for about a week, particularly on exertion  Clinical Impression  Patient demonstrates deficits in functional mobility as indicated below. Will benefit from continued skilled PT to address deficits and maximize function. Will see as indicated and progress as tolerated.   OF NOTE: Patient reports history of "atelast one fall per week". Patient unable to safely perform mobility without physical assist. At this time requires moderate to maximal assist for stability to prevent fall. Patient was unable to stand from bed without assist despite several attempts patient kept falling onto the bed requiring increased physical assist to come to standing. Feel patient is a significant fall risk and do not feel patient is safe for discharge home alone given history of multiple falls, inability to perform any mobility tasks without assist, and poor cognition/safety awareness. Recommending SNF at this time.    Follow Up Recommendations SNF;Supervision/Assistance - 24 hour    Equipment Recommendations  None recommended by PT    Recommendations for Other Services       Precautions / Restrictions Precautions Precautions: Fall      Mobility  Bed Mobility Overal bed mobility: Needs Assistance Bed Mobility: Sidelying to Sit;Sit to Sidelying   Sidelying to sit: Min assist     Sit to sidelying: Min assist General bed mobility comments: Min assist for sequencing, patient with right lateral lean at times when attempting to come to EOB, assist for positioning. Patient instructed for single step command to bend knees, unable to perform  without manual cues. Patient required assist to return to supine in bed and assist for positioning.  Transfers Overall transfer level: Needs assistance Equipment used: Rolling walker (2 wheeled) Transfers: Sit to/from Stand Sit to Stand: Mod assist;+2 safety/equipment;From elevated surface         General transfer comment: Patient intially attempting to stand with holding on to RW. Instructed patient with VCs for han dplacement, patient unable to power up due to weakness, patient then attempted to show [place] how her performs at home while holding onto the RW, patient unable to do so as RW and patient falling posteriorly back on to bed. Patient required moderate assist to power up to stnading, with physical assist to stabilize walker by second person.   Ambulation/Gait Ambulation/Gait assistance: Max assist   Assistive device: Rolling walker (2 wheeled) Gait Pattern/deviations: Step-through pattern;Decreased stride length;Steppage;Shuffle;Staggering left;Staggering right;Leaning posteriorly;Narrow base of support Gait velocity: varying Gait velocity interpretation: Below normal speed for age/gender General Gait Details: patient very unsteady with gait, signifcant posterior lean requiring moderate to maximmial assist during initiation of movement, patient improved after ~10 ft to min assist for stability but then required maximal assist for turns with significant instability and PD shuffling.  Stairs            Wheelchair Mobility    Modified Rankin (Stroke Patients Only)       Balance Overall balance assessment: History of Falls (patient reports atleast one fall per wk)  Pertinent Vitals/Pain Pain Assessment: No/denies pain    Home Living Family/patient expects to be discharged to:: Private residence Living Arrangements: Alone Available Help at Discharge:  (per pt, confirmed by nsg, patient has HHPT 2x wk and HH  Nsg.) Type of Home: House Home Access: Stairs to enter   [employer] of Steps: 1 Home Layout: One level        Prior Function Level of Independence: Needs assistance   Gait / Transfers Assistance Needed: uses RW for mobility     Comments: patient reports he uses the walker at home for mbility,  When asked about falls patient reports atleast one fall per week      Hand Dominance   Dominant Hand: Right    Extremity/Trunk Assessment   Upper Extremity Assessment: Defer to OT evaluation;Generalized weakness           Lower Extremity Assessment: Generalized weakness;RLE deficits/detail;LLE deficits/detail RLE Deficits / Details: patient with genrealized wekaness, + arthritic changes LLE Deficits / Details: decreased light touch     Communication   Communication: HOH  Cognition Arousal/Alertness: Awake/alert Behavior During Therapy: Impulsive Overall Cognitive Status: Impaired/Different from baseline Area of Impairment: Safety/judgement;Awareness;Problem solving         Safety/Judgement: Decreased awareness of safety;Decreased awareness of deficits Awareness: Intellectual Problem Solving: Slow processing;Decreased initiation;Difficulty sequencing;Requires verbal cues;Requires tactile cues General Comments: Patient reports history of at least one fall per wk but does not consider this alarming, when inquired what patient does during the day he states that he sits in the recliner all day and then going to bed when the sun goes down but could not state what he would do if he needed to use the bathroom or if the house was on fire. Patient with poor awareness and problem solving for basic safety measures    General Comments General comments (skin integrity, edema, etc.): patient noted with several areas of bruising and skin tears over his extremities. Patient reports multiple falls.    Exercises        Assessment/Plan    PT Assessment Patient needs  continued PT services  PT Diagnosis Difficulty walking;Abnormality of gait;Generalized weakness;Altered mental status   PT Problem List Decreased strength;Decreased range of motion;Decreased activity tolerance;Decreased balance;Decreased mobility;Decreased coordination;Decreased cognition;Decreased safety awareness  PT Treatment Interventions DME instruction;Gait training;Stair training;Functional mobility training;Therapeutic activities;Therapeutic exercise;Balance training;Patient/family education   PT Goals (Current goals can be found in the Care Plan section) Acute Rehab PT Goals Patient Stated Goal: to go home PT Goal Formulation: With patient Time For Goal Achievement: 01/23/15 Potential to Achieve Goals: Fair    Frequency Min 3X/week   Barriers to discharge        Co-evaluation               End of Session Equipment Utilized During Treatment: Gait belt Activity Tolerance: Patient tolerated treatment well Patient left: in bed;with call bell/phone within reach;with bed alarm set Nurse Communication: Mobility status         Time: 1607-3710 PT Time Calculation (min) (ACUTE ONLY): 21 min   Charges:   PT Evaluation $Initial PT Evaluation Tier I: 1 Procedure     PT G CodesDuncan Dull 01-22-2015, 2:23 PM Alben Deeds, [place] DPT  [phone]

## 2015-01-10 DIAGNOSIS — I2510 Atherosclerotic heart disease of native coronary artery without angina pectoris: Secondary | ICD-10-CM

## 2015-01-10 DIAGNOSIS — N189 Chronic kidney disease, unspecified: Secondary | ICD-10-CM

## 2015-01-10 DIAGNOSIS — N179 Acute kidney failure, unspecified: Secondary | ICD-10-CM

## 2015-01-10 LAB — BASIC METABOLIC PANEL
Anion gap: 8 (ref 5–15)
BUN: 25 mg/dL — ABNORMAL HIGH (ref 6–20)
CALCIUM: 8.8 mg/dL — AB (ref 8.9–10.3)
CO2: 29 mmol/L (ref 22–32)
CREATININE: 1.64 mg/dL — AB (ref 0.61–1.24)
Chloride: 103 mmol/L (ref 101–111)
GFR, EST AFRICAN AMERICAN: 44 mL/min — AB (ref >60)
GFR, EST NON AFRICAN AMERICAN: 38 mL/min — AB (ref >60)
Glucose, Bld: 87 mg/dL (ref 65–99)
Potassium: 3.6 mmol/L (ref 3.5–5.1)
Sodium: 140 mmol/L (ref 135–145)

## 2015-01-10 MED ORDER — MUPIROCIN 2 % EX OINT
TOPICAL_OINTMENT | Freq: Every day | CUTANEOUS | Status: DC
Start: 2015-01-10 — End: 2015-01-15
  Administered 2015-01-10 – 2015-01-15 (×6): via TOPICAL
  Filled 2015-01-10: qty 22

## 2015-01-10 MED ORDER — FUROSEMIDE 20 MG PO TABS
30.0000 mg | ORAL_TABLET | Freq: Every day | ORAL | Status: DC
  Administered 2015-01-10 – 2015-01-12 (×3): 30 mg via ORAL
  Filled 2015-01-10 (×3): qty 1.5

## 2015-01-10 NOTE — Clinical Social Work Placement (Signed)
   CLINICAL SOCIAL WORK PLACEMENT  NOTE  Date:  01/10/2015  Patient Details  Name: Thomas [name] MRN: [id number] Date of Birth: Feb 13, 1934  Clinical Social Work is seeking post-discharge placement for this patient at the [place] level of care (*CSW will initial, date and re-position this form in  chart as items are completed):  Yes   Patient/family provided with [place] Work Department's list of facilities offering this level of care within the geographic area requested by the patient (or if unable, by the patient's family).  Yes   Patient/family informed of their freedom to choose among providers that offer the needed level of care, that participate in Medicare, Medicaid or managed care program needed by the patient, have an available bed and are willing to accept the patient.  Yes   Patient/family informed of [place]'s ownership interest in [facility] and [facility], as well as of the fact that they are under no obligation to receive care at these facilities.  PASRR submitted to EDS on       PASRR number received on       Existing PASRR number confirmed on       FL2 transmitted to all facilities in geographic area requested by pt/family on 01/10/15     FL2 transmitted to all facilities within larger geographic area on       Patient informed that his/her managed care company has contracts with or will negotiate with certain facilities, including the following:            Patient/family informed of bed offers received.  Patient chooses bed at       Physician recommends and patient chooses bed at      Patient to be transferred to   on  .  Patient to be transferred to facility by       Patient family notified on   of transfer.  Name of family member notified:        PHYSICIAN       Additional Comment:    _______________________________________________ Ludwig Clarks, LCSW 01/10/2015, 4:04 PM

## 2015-01-10 NOTE — Clinical Social Work Note (Signed)
Clinical Social Work Assessment  Patient Details  Name: Thomas [name] MRN: [id number] Date of Birth: [birth year]  Date of referral:  01/10/15               Reason for consult:  Facility Placement, Discharge Planning                Permission sought to share information with:  Family Supports, Customer service manager Permission granted to share information::  Yes, Verbal Permission Granted  Name::      [name] [phone])  Agency::     Relationship::   (daughter- [name])  Contact Information:   [name] [phone] cell/ )  Housing/Transportation Living arrangements for the past 2 months:  [place] of Information:  Patient Patient Interpreter Needed:  None Criminal Activity/Legal Involvement Pertinent to Current Situation/Hospitalization:  No - Comment as needed Significant Relationships:  Adult Children Lives with:  Self Do you feel safe going back to the place where you live?  No Need for family participation in patient care:  Yes (Comment)  Care giving concerns:  Patient lives alone; PT is recommending SNF for rehab at dc- Daughter reports her husband is having surgery on MOnday and she will be unable to be around to help dad as much- as well, patient has a son who runs his own business. Per daughter, he is limited as well. Patient wants to go home and is hesitantly agreeing to SNF. "   Social Worker assessment / plan:  CSW to complete FL2 for SNF search- no PASARR needed in [place].  Employment status:  Retired Forensic scientist:  Medicare PT Recommendations:    Information / Referral to community resources:     Patient/Family's Response to care:  Patient would like to go home but is agreeing to SNF if we can find him a bed in [place] Per daughter, he is upset with her.    Patient/Family's Understanding of and Emotional Response to Diagnosis, Current Treatment, and Prognosis:  Daughter says he has gone down hill recently at home with his  Parkinsons disease- she is concerned for his safety and well-being and wants him placed- at least short term.  Emotional Assessment Appearance:  Appears stated age Attitude/Demeanor/Rapport:  Other (not happy about the need for SNF) Affect (typically observed):  Sad, Appropriate Orientation:  Oriented to Self, Oriented to Place, Oriented to  Time, Oriented to Situation Alcohol / Substance use:  Not Applicable Psych involvement (Current and /or in the community):  No (Comment)  Discharge Needs  Concerns to be addressed:  Discharge Planning Concerns Readmission within the last 30 days:  No Current discharge risk:  Physical Impairment Barriers to Discharge:  No Barriers Identified   Thomas Clarks, Thomas [name] 01/10/2015, 3:57 PM

## 2015-01-10 NOTE — Progress Notes (Signed)
Pharmacist Heart Failure Core Measure Documentation  Assessment: Thomas [name] has an EF documented as 25-30% on 06/02/14 by Echo.  Rationale: Heart failure patients with left ventricular systolic dysfunction (LVSD) and an EF < 40% should be prescribed an angiotensin converting enzyme inhibitor (ACEI) or angiotensin receptor blocker (ARB) at discharge unless a contraindication is documented in the medical record.  This patient is not currently on an ACEI or ARB for HF.  This note is being placed in the record in order to provide documentation that a contraindication to the use of these agents is present for this encounter.  ACE Inhibitor or Angiotensin Receptor Blocker is contraindicated (specify all that apply)  []   ACEI allergy AND ARB allergy []   Angioedema []   Moderate or severe aortic stenosis []   Hyperkalemia []   Hypotension []   Renal artery stenosis [x]   Worsening renal function, preexisting renal disease or dysfunction   Joya San, PharmD Clinical Pharmacist Pager # [phone] 01/10/2015 1:26 PM

## 2015-01-10 NOTE — Progress Notes (Signed)
Patient alert and oriented x3 throughout shift, vital signs stable.  Patient verbalized disappointment that he is not being discharged to home today but states he understands that his family prefers him to go to a SNF due to needing 24 hr care at this time.  Upon assessment, noted patient's left arm skin tear to have small amount of yellow/off-white pus on surface.  Dr. Dyann Kief made aware, antibiotic ointment ordered (see MAR).  Wound cleansed with NS and redressed with foam and ointment.  Patient denies any questions or concerns at this time, will continue to monitor.

## 2015-01-10 NOTE — Progress Notes (Signed)
[facility] PROGRESS NOTE  Thomas [name] ATF:[id number] DOB: 1934-01-07 DOA: 01/08/2015 PCP: Madaline Brilliant, MD  Assessment/Plan: 1-acute on chronic systolic heart failure: EF 25-30% -SOB and swelling significantly improved; will switch to PO lasix. Plan is for discharge on lasix 30mg  daily. (discussed with Dr. Benjamine Mola Heart failure service) -strict I's and O's -follow low sodium diet -continue toprol -no ACE/ARB due to chronic renal failure  2- a. Fib: continue pacerone -CHADsVASC score 4 -on ASA and plavix -high risk for falling due to parkinson's disease and no good candidate for anticoagulation  3-hx of CAD: no CP -troponin neg -continue ASA, plavix, b-blockers and statins  4-HLD: will continue statins  5-hx of parkinson's: will continue mirapex and amantadine  -needs physical reconditioning   6-depression: continue cymbalta  7-GERD; continue PPI  8-RLE swelling/redness: no DVT and no true signs of infection appreciated -improved with diuresis -will follow clinical response -no abx's needed  9-CKD stage III: stable and in fact better than at baseline after diuresis -will follow BMET -Cr 1.64 on 7/30  10-physical deconditioning: will need SNF. -SW consulted  Code Status: Full Family Communication: no family at bedside Disposition Plan: found to be in need of SNF due to physical deconditioning    Consultants:  None   Procedures:  LE duplex: negative for DVT   V/Q scan: no PE  Antibiotics:  None   HPI/Subjective: No CP, no fever, no nausea, no vomiting. Patient with significant improvement in his LE swelling and breathing. No fever  Objective: Filed Vitals:   01/10/15 0936  BP: 99/49  Pulse: 59  Temp:   Resp: 16    Intake/Output Summary (Last 24 hours) at 01/10/15 1214 Last data filed at 01/10/15 0941  Gross per 24 hour  Intake    723 ml  Output   1575 ml  Net   -852 ml   Filed Weights   01/08/15 2017 01/09/15 0700 01/10/15  0500  Weight: 84.5 kg (186 lb 4.6 oz) 83.2 kg (183 lb 6.8 oz) 83.008 kg (183 lb)    Exam:   General:  Afebrile, no CP, continue reporting improvement in his breathing and with significant improvement and no pain on his RLE swelling. Patient is AAOX2  Cardiovascular: S1 and S2, no rubs, no gallops, no JVD  Respiratory: improve air movements, no wheezing, no crackles.   Abdomen: soft, NT, ND, positive BS  Musculoskeletal: trace edema bilaterally, no open wounds; RLE with very mild erythema, no pain.  Data Reviewed: Basic Metabolic Panel:  Recent Labs Lab 01/08/15 1519 01/08/15 2200 01/09/15 0351 01/10/15 0300  NA 140  --  139 140  K 4.5  --  3.6 3.6  CL 102  --  101 103  CO2 29  --  31 29  GLUCOSE 96  --  100* 87  BUN 31*  --  26* 25*  CREATININE 2.35* 2.13* 1.90* 1.64*  CALCIUM 9.2  --  8.7* 8.8*   Liver Function Tests:  Recent Labs Lab 01/09/15 0351  AST 21  ALT 24  ALKPHOS 74  BILITOT 1.1  PROT 6.1*  ALBUMIN 3.2*   CBC:  Recent Labs Lab 01/08/15 1519 01/08/15 2200 01/09/15 0351  WBC 6.8 5.6 5.5  HGB 12.0* 10.9* 10.9*  HCT 36.8* 34.0* 34.1*  MCV 87.8 87.9 89.3  PLT 189 148* 150   Cardiac Enzymes:  Recent Labs Lab 01/08/15 1519 01/08/15 2200 01/09/15 0351  TROPONINI 0.03 0.03 0.03   BNP (last 3 results)  Recent Labs  06/13/14  1038 01/08/15 1519 01/08/15 2200  BNP 740.2* 373.8* 538.7*    ProBNP (last 3 results)  Recent Labs  08/12/14 1254 12/03/14 1557  PROBNP 400.0* 375.0*    Studies: Dg Chest 2 View  01/08/2015   CLINICAL DATA:  Shortness of breath for 2 months  EXAM: CHEST  2 VIEW  COMPARISON:  Nov 04, 2014  FINDINGS: The heart size and mediastinal contours are stable. The heart size is enlarged. The aorta is tortuous. Cardiac pacemaker is unchanged. There is central pulmonary vascular congestion without frank edema. There is no focal pneumonia or pleural effusion. Right calcified pleural plaque is unchanged. The visualized  skeletal structures are stable.  IMPRESSION: Cardiomegaly and central pulmonary vascular congestion unchanged compared to prior exam. No focal pneumonia.   Electronically Signed   By: Abelardo Diesel M.D.   On: 01/08/2015 16:57   Ct Head Wo Contrast  01/08/2015   CLINICAL DATA:  Confusion, recent fall, altered mental status.  EXAM: CT HEAD WITHOUT CONTRAST  TECHNIQUE: Contiguous axial images were obtained from the base of the skull through the vertex without contrast.  COMPARISON:  None  FINDINGS: Diffuse brain atrophy and chronic white matter microvascular ischemic changes throughout the cerebral hemispheres. Remote right occipital infarct with encephalomalacia. No acute intracranial hemorrhage, definite acute infarction, mass lesion, midline shift, herniation, hydrocephalus, or extra-axial fluid collection. Cisterns are patent. Cerebellar atrophy as well. Atherosclerosis of the intracranial vessels at the skullbase. Orbits are symmetric. Mastoids and sinuses remain clear.  IMPRESSION: Atrophy, chronic white matter ischemic change, and remote right occipital PCA territory infarct.  Intracranial atherosclerosis  No acute intracranial finding by noncontrast CT.   Electronically Signed   By: Jerilynn Mages.  Shick M.D.   On: 01/08/2015 16:37   Nm Pulmonary Perf And Vent  01/08/2015   CLINICAL DATA:  Shortness of breath.  EXAM: NUCLEAR MEDICINE VENTILATION - PERFUSION LUNG SCAN  TECHNIQUE: Ventilation images were obtained in multiple projections using inhaled aerosol Tc-26m DTPA. Perfusion images were obtained in multiple projections after intravenous injection of Tc-60m MAA.  RADIOPHARMACEUTICALS:  40 mCi of Technetium-40m DTPA aerosol inhalation and 6 mCi of Technetium-29m MAA IV  COMPARISON:  Chest x-ray dated 01/08/2015  FINDINGS: Ventilation: No focal ventilation defect.  Perfusion: No wedge shaped peripheral perfusion defects to suggest acute pulmonary embolism.  IMPRESSION: Normal ventilation-perfusion lung scan.    Electronically Signed   By: Lorriane Shire M.D.   On: 01/08/2015 19:54    Scheduled Meds: . amantadine  100 mg Oral BID  . amiodarone  200 mg Oral Daily  . aspirin EC  81 mg Oral Daily  . clopidogrel  75 mg Oral Daily  . DULoxetine  30 mg Oral QODAY  . furosemide  30 mg Oral Daily  . metoprolol succinate  12.5 mg Oral Daily  . pantoprazole  40 mg Oral Daily  . pramipexole  0.5 mg Oral TID  . rotigotine  1 patch Transdermal Daily  . simvastatin  20 mg Oral q1800  . sodium chloride  3 mL Intravenous Q12H   Continuous Infusions:   Principal Problem:   Shortness of breath dyspnea Active Problems:   Essential hypertension   Non-ischemic cardiomyopathy-EF 25-30%   CAD-single vessel (RCA) in 2012   CAD (coronary artery disease), native coronary artery   Parkinson disease   Worsening renal function   Time spent: 30 minutes   Barton Dubois  [facility] Pager [phone]. If 7PM-7AM, please contact night-coverage at [email or web address], password [facility] 01/10/2015, 12:14 PM  LOS:  1 day

## 2015-01-11 LAB — BASIC METABOLIC PANEL
ANION GAP: 8 (ref 5–15)
BUN: 23 mg/dL — ABNORMAL HIGH (ref 6–20)
CALCIUM: 9 mg/dL (ref 8.9–10.3)
CO2: 26 mmol/L (ref 22–32)
Chloride: 106 mmol/L (ref 101–111)
Creatinine, Ser: 1.55 mg/dL — ABNORMAL HIGH (ref 0.61–1.24)
GFR, EST AFRICAN AMERICAN: 47 mL/min — AB (ref >60)
GFR, EST NON AFRICAN AMERICAN: 41 mL/min — AB (ref >60)
Glucose, Bld: 107 mg/dL — ABNORMAL HIGH (ref 65–99)
Potassium: 3.8 mmol/L (ref 3.5–5.1)
SODIUM: 140 mmol/L (ref 135–145)

## 2015-01-11 NOTE — Progress Notes (Signed)
Pt was not acting like himself, according to pt's daughter, more confused than his norm, called MD, MD aware will continue to monitor, Thanks Arvella Nigh RN

## 2015-01-11 NOTE — Progress Notes (Signed)
[facility] PROGRESS NOTE  Thomas [name] [id number] DOB: 12-07-1933 DOA: 01/08/2015 PCP: Madaline Brilliant, MD  Assessment/Plan: 1-acute on chronic systolic heart failure: EF 25-30% -SOB and swelling significantly improved; will switch to PO lasix. Plan is for discharge on lasix 30 mg daily. (discussed with Dr. Benjamine Mola Heart failure service) -strict I's and O's -follow low sodium diet -continue toprol -no ACE/ARB due to chronic renal failure  2- a. Fib: continue pacerone -CHADsVASC score 4 -on ASA and plavix -high risk for falling due to parkinson's disease and no good candidate for anticoagulation  3-hx of CAD: no CP -troponin neg -continue ASA, plavix, b-blockers and statins  4-HLD: will continue statins  5-hx of parkinson's: will continue mirapex and amantadine  -needs physical reconditioning   6-depression: continue cymbalta  7-GERD; continue PPI  8-RLE swelling/redness: no DVT and no true signs of infection appreciated -improved with diuresis -will follow clinical response -no abx's needed  9-CKD stage III: stable and in fact better than at baseline after diuresis -will follow BMET -Cr 1.55 on 7/31  10-physical deconditioning: will need SNF. -SW consulted  Code Status: Full Family Communication: no family at bedside Disposition Plan: found to be in need of SNF due to physical deconditioning; most likely nursing home in am.   Consultants:  None   Procedures:  LE duplex: negative for DVT   V/Q scan: no PE  Antibiotics:  None   HPI/Subjective: No CP, no fever, no nausea, no vomiting and no SOB.   Objective: Filed Vitals:   01/11/15 1352  BP: 135/58  Pulse: 59  Temp: 98 F (36.7 C)  Resp: 18    Intake/Output Summary (Last 24 hours) at 01/11/15 1755 Last data filed at 01/11/15 1359  Gross per 24 hour  Intake    720 ml  Output    525 ml  Net    195 ml   Filed Weights   01/09/15 0700 01/10/15 0500 01/11/15 0247  Weight: 83.2 kg  (183 lb 6.8 oz) 83.008 kg (183 lb) 83.5 kg (184 lb 1.4 oz)    Exam:   General:  Afebrile, no CP, no SOB  and no pain on his RLE swelling. Patient is AAOX2  Cardiovascular: S1 and S2, no rubs, no gallops, no JVD  Respiratory: improve air movement, no wheezing, no crackles.   Abdomen: soft, NT, ND, positive BS  Musculoskeletal: no edema bilaterally, no open wounds; RLE with very mild erythema, no pain.  Data Reviewed: Basic Metabolic Panel:  Recent Labs Lab 01/08/15 1519 01/08/15 2200 01/09/15 0351 01/10/15 0300 01/11/15 0255  NA 140  --  139 140 140  K 4.5  --  3.6 3.6 3.8  CL 102  --  101 103 106  CO2 29  --  31 29 26   GLUCOSE 96  --  100* 87 107*  BUN 31*  --  26* 25* 23*  CREATININE 2.35* 2.13* 1.90* 1.64* 1.55*  CALCIUM 9.2  --  8.7* 8.8* 9.0   Liver Function Tests:  Recent Labs Lab 01/09/15 0351  AST 21  ALT 24  ALKPHOS 74  BILITOT 1.1  PROT 6.1*  ALBUMIN 3.2*   CBC:  Recent Labs Lab 01/08/15 1519 01/08/15 2200 01/09/15 0351  WBC 6.8 5.6 5.5  HGB 12.0* 10.9* 10.9*  HCT 36.8* 34.0* 34.1*  MCV 87.8 87.9 89.3  PLT 189 148* 150   Cardiac Enzymes:  Recent Labs Lab 01/08/15 1519 01/08/15 2200 01/09/15 0351  TROPONINI 0.03 0.03 0.03   BNP (last 3  results)  Recent Labs  06/13/14 1038 01/08/15 1519 01/08/15 2200  BNP 740.2* 373.8* 538.7*    ProBNP (last 3 results)  Recent Labs  08/12/14 1254 12/03/14 1557  PROBNP 400.0* 375.0*    Studies: No results found.  Scheduled Meds: . amantadine  100 mg Oral BID  . amiodarone  200 mg Oral Daily  . aspirin EC  81 mg Oral Daily  . clopidogrel  75 mg Oral Daily  . DULoxetine  30 mg Oral QODAY  . furosemide  30 mg Oral Daily  . metoprolol succinate  12.5 mg Oral Daily  . mupirocin ointment   Topical Daily  . pantoprazole  40 mg Oral Daily  . pramipexole  0.5 mg Oral TID  . rotigotine  1 patch Transdermal Daily  . simvastatin  20 mg Oral q1800  . sodium chloride  3 mL Intravenous  Q12H   Continuous Infusions:   Principal Problem:   Shortness of breath dyspnea Active Problems:   Essential hypertension   Non-ischemic cardiomyopathy-EF 25-30%   CAD-single vessel (RCA) in 2012   CAD (coronary artery disease), native coronary artery   Parkinson disease   Worsening renal function   Time spent: 30 minutes   Barton Dubois  [facility] Pager [phone]. If 7PM-7AM, please contact night-coverage at [email or web address], password [facility] 01/11/2015, 5:55 PM  LOS: 2 days

## 2015-01-11 NOTE — Progress Notes (Signed)
Patient's family had concerns they had witnessed a seizure. Notified Md [place]. Patient's VS remained stable and his cardiac monitoring remained at his baseline. Family was reassured that patient would be monitored closely.

## 2015-01-11 NOTE — Plan of Care (Signed)
Problem: Phase I Progression Outcomes Goal: EF % per last Echo/documented,Core Reminder form on chart Outcome: Completed/Met Date Met:  01/11/15 EF 25-30%(05-2014)

## 2015-01-12 ENCOUNTER — Inpatient Hospital Stay (HOSPITAL_COMMUNITY): Payer: Medicare Other

## 2015-01-12 DIAGNOSIS — N390 Urinary tract infection, site not specified: Secondary | ICD-10-CM

## 2015-01-12 LAB — URINALYSIS, ROUTINE W REFLEX MICROSCOPIC
Bilirubin Urine: NEGATIVE
Glucose, UA: NEGATIVE mg/dL
Hgb urine dipstick: NEGATIVE
KETONES UR: NEGATIVE mg/dL
NITRITE: POSITIVE — AB
PH: 8.5 — AB (ref 5.0–8.0)
PROTEIN: 30 mg/dL — AB
Specific Gravity, Urine: 1.019 (ref 1.005–1.030)
UROBILINOGEN UA: 1 mg/dL (ref 0.0–1.0)

## 2015-01-12 LAB — BASIC METABOLIC PANEL
ANION GAP: 10 (ref 5–15)
BUN: 24 mg/dL — AB (ref 6–20)
CALCIUM: 9.2 mg/dL (ref 8.9–10.3)
CHLORIDE: 107 mmol/L (ref 101–111)
CO2: 25 mmol/L (ref 22–32)
CREATININE: 1.81 mg/dL — AB (ref 0.61–1.24)
GFR calc non Af Amer: 34 mL/min — ABNORMAL LOW (ref >60)
GFR, EST AFRICAN AMERICAN: 39 mL/min — AB (ref >60)
Glucose, Bld: 95 mg/dL (ref 65–99)
Potassium: 3.8 mmol/L (ref 3.5–5.1)
Sodium: 142 mmol/L (ref 135–145)

## 2015-01-12 LAB — URINE MICROSCOPIC-ADD ON

## 2015-01-12 LAB — GLUCOSE, CAPILLARY
GLUCOSE-CAPILLARY: 179 mg/dL — AB (ref 65–99)
Glucose-Capillary: 111 mg/dL — ABNORMAL HIGH (ref 65–99)

## 2015-01-12 LAB — PHOSPHORUS: Phosphorus: 3.3 mg/dL (ref 2.5–4.6)

## 2015-01-12 LAB — MAGNESIUM: Magnesium: 2.2 mg/dL (ref 1.7–2.4)

## 2015-01-12 MED ORDER — LEVOFLOXACIN IN D5W 750 MG/150ML IV SOLN: 750.0000 mg | INTRAVENOUS | Status: DC

## 2015-01-12 MED ORDER — LEVOFLOXACIN 750 MG PO TABS
750.0000 mg | ORAL_TABLET | ORAL | Status: DC
  Filled 2015-01-12: qty 1

## 2015-01-12 MED ORDER — FUROSEMIDE 20 MG PO TABS
20.0000 mg | ORAL_TABLET | Freq: Every day | ORAL | Status: DC
  Administered 2015-01-13 – 2015-01-15 (×3): 20 mg via ORAL
  Filled 2015-01-12 (×3): qty 1

## 2015-01-12 MED ORDER — LEVOFLOXACIN IN D5W 750 MG/150ML IV SOLN
750.0000 mg | Freq: Once | INTRAVENOUS | Status: AC
  Administered 2015-01-12: 750 mg via INTRAVENOUS
  Filled 2015-01-12: qty 150

## 2015-01-12 MED ORDER — LORAZEPAM 2 MG/ML IJ SOLN
0.5000 mg | Freq: Once | INTRAMUSCULAR | Status: AC
  Administered 2015-01-12: 0.5 mg via INTRAVENOUS
  Filled 2015-01-12: qty 1

## 2015-01-12 NOTE — Progress Notes (Signed)
EEG completed; results pending.    

## 2015-01-12 NOTE — Progress Notes (Signed)
Physical Therapy Treatment Patient Details Name: Thomas [name] MRN: [id number] DOB: [birth year] Today's Date: 01/12/2015    History of Present Illness 81 y.o. male with a past medical history of hypertension, CAD, status post biventricular cardiac pacemaker insertion, hypothyroidism, BPH, Parkinson's disease who comes to the ER with a history of progressively worse dyspnea for about a week, particularly on exertion    PT Comments    Pt continues to remain confused, requires 2 person assist for mobility and limited to in room gait secondary to tremor, posterior lean and decreased safety/endurance. Pt without SOB with sats 97% on RA throughout. Continue to recommend SNF and will follow acutely.   Follow Up Recommendations  SNF;Supervision/Assistance - 24 hour     Equipment Recommendations  None recommended by PT    Recommendations for Other Services       Precautions / Restrictions Precautions Precautions: Fall Restrictions Weight Bearing Restrictions: No    Mobility  Bed Mobility               General bed mobility comments: Pt found in chair and returned to chair after session.  Transfers Overall transfer level: Needs assistance Equipment used: 2 person hand held assist Transfers: Sit to/from Stand Sit to Stand: +2 safety/equipment;Mod assist         General transfer comment: Initially attempted to stand from chair to RW but 2/2 tremors elected to proceed with 2 person hand held assist. Pt needed VCs for hand placement and mod A to powerup. Pt stands with posterior lean. Pt required mod A to remain standing upright.  Ambulation/Gait Ambulation/Gait assistance: Mod assist;+2 safety/equipment Ambulation Distance (Feet): 40 Feet Assistive device: 2 person hand held assist Gait Pattern/deviations: Step-to pattern;Decreased stride length;Shuffle;Staggering left;Staggering right;Narrow base of support;Leaning posteriorly   Gait velocity interpretation: Below normal  speed for age/gender General Gait Details: pt with significant posterior lean, tremor throughout and max assist to shift anteriorly. Utilized bil UE assist with increased pressure into pt bil UE to assist with calming and controlling gait and trunk. Pt unable to bring shoulders over feet   Stairs            Wheelchair Mobility    Modified Rankin (Stroke Patients Only)       Balance                                    Cognition Arousal/Alertness: Awake/alert Behavior During Therapy: WFL for tasks assessed/performed Overall Cognitive Status: Impaired/Different from baseline Area of Impairment: Orientation Orientation Level: Place;Situation       Safety/Judgement: Decreased awareness of safety;Decreased awareness of deficits   Problem Solving: Slow processing;Decreased initiation;Difficulty sequencing;Requires verbal cues;Requires tactile cues General Comments: Pt unaware of situation or place. Talked about a some truck throughout session.    Exercises General Exercises - Lower Extremity Long Arc Quad: AROM;Both;10 reps;Seated Hip Flexion/Marching: AROM;Both;10 reps;Seated    General Comments        Pertinent Vitals/Pain Pain Assessment:  (PAINAD=0)    Home Living                      Prior Function            PT Goals (current goals can now be found in the care plan section) Progress towards PT goals: Progressing toward goals    Frequency  Min 3X/week    PT Plan Current plan remains appropriate  Co-evaluation             End of Session Equipment Utilized During Treatment: Gait belt Activity Tolerance: Patient tolerated treatment well Patient left: in chair;with call bell/phone within reach;with nursing/sitter in room     Time: 1749-4496 PT Time Calculation (min) (ACUTE ONLY): 25 min  Charges:  $Gait Training: 8-22 mins $Therapeutic Activity: 8-22 mins                    G Codes:      Melford Aase 01/12/2015, 12:42 PM Elwyn Reach, [place]

## 2015-01-12 NOTE — Procedures (Signed)
History: 80 yo male being evaluated for tremor.  Sedation: None  Technique: This is a 19 channel routine scalp EEG performed at the bedside with bipolar and monopolar montages arranged in accordance to the international 10/20 system of electrode placement. One channel was dedicated to EKG recording.    Background: The background consists of intermixed alpha and beta activities. There is frequent tremor artifact throughout the study. There is a well defined posterior dominant rhythm of 10 Hz that attenuates with eye opening. Sleep is recorded with normal appearing structures.   Photic stimulation: Physiologic driving is performed  EEG Abnormalities: Frequent movement artifact  Clinical Interpretation: This EEG was essentially normal. Due to the frequent artifact, subtle slowing or other subtle findings of focal abnormalities could have been overlooked on this study but was adequate to rule out an epileptic nature of his tremor. There was no seizure or seizure predisposition recorded on this study.   Roland Rack, MD [facility] [phone]  If 7pm- 7am, please page neurology on call as listed in [place].

## 2015-01-12 NOTE — Consult Note (Signed)
Consult Reason for Consult: generalized tremors Referring Physician: Dr Dyann Kief  CC: generalized tremors  HPI: Thomas [name] is an 81 y.o. male hx of Parkinson's disease presenting with progressively worsening dyspnea found to have CHF exacerbation and UTI. While in the hospital noted to have episodes of generalized shaking, described as severe tremors. No LOC or altered consciousness during these episodes. No noted loss of bowel/bladder. No prior seizure history.   CT head imaging reviewed, shows atrophy, remote right occipital PCA infarct.   Past Medical History  Diagnosis Date  . Coronary artery disease     80% distal RCA stenosis.  . Thyroid disease     HYPOTHYROIDISM  . Nontoxic multinodular goiter   . Psychosexual dysfunction with inhibited sexual excitement     ERECTILE DYSFUNCTION  . Hypertrophy of prostate without urinary obstruction and other lower urinary tract symptoms (LUTS)     BPH  . Hypertension   . Wide-complex tachycardia   . Parkinson's disease   . Status post biventricular cardiac pacemaker insertion     Status post CRT-D.    Past Surgical History  Procedure Laterality Date  . Cardiac catheterization    . Cardioversion N/A 06/03/2014    Procedure: CARDIOVERSION;  Surgeon: Thompson Grayer, MD;  Location: [facility] CATH LAB;  Service: Cardiovascular;  Laterality: N/A;  . Left heart catheterization with coronary angiogram N/A 06/10/2014    Procedure: LEFT HEART CATHETERIZATION WITH CORONARY ANGIOGRAM;  Surgeon: Sinclair Grooms, MD;  Location: [facility] CATH LAB;  Service: Cardiovascular;  Laterality: N/A;  . Abdominal angiogram  06/10/2014    Procedure: ABDOMINAL ANGIOGRAM;  Surgeon: Sinclair Grooms, MD;  Location: [facility] CATH LAB;  Service: Cardiovascular;;  . Percutaneous coronary stent intervention (pci-s) N/A 06/12/2014    Procedure: PERCUTANEOUS CORONARY STENT INTERVENTION (PCI-S);  Surgeon: Sinclair Grooms, MD;  Location: [facility] CATH LAB;  Service: Cardiovascular;   Laterality: N/A;  . Percutaneous coronary rotoblator intervention (pci-r)  06/12/2014    Procedure: PERCUTANEOUS CORONARY ROTOBLATOR INTERVENTION (PCI-R);  Surgeon: Sinclair Grooms, MD;  Location: [facility] CATH LAB;  Service: Cardiovascular;;  . Cardiac catheterization  06/12/2014    Procedure: CORONARY STENT INTERVENTION W/IMPELLA;  Surgeon: Sinclair Grooms, MD;  Location: [facility] CATH LAB;  Service: Cardiovascular;;    Family History  Problem Relation Age of Onset  . Heart attack Mother   . Thyroid disease Neg Hx   . Coronary artery disease Other     Social History:  reports that he quit smoking about 19 years ago. His smoking use included Cigarettes. He has a 45 pack-year smoking history. He has never used smokeless tobacco. He reports that he does not drink alcohol or use illicit drugs.  No Known Allergies  Medications:  Scheduled: . amantadine  100 mg Oral BID  . amiodarone  200 mg Oral Daily  . aspirin EC  81 mg Oral Daily  . clopidogrel  75 mg Oral Daily  . DULoxetine  30 mg Oral QODAY  . [START ON 01/13/2015] furosemide  20 mg Oral Daily  . [START ON 01/14/2015] levofloxacin  750 mg Oral Q48H  . metoprolol succinate  12.5 mg Oral Daily  . mupirocin ointment   Topical Daily  . pantoprazole  40 mg Oral Daily  . pramipexole  0.5 mg Oral TID  . rotigotine  1 patch Transdermal Daily  . sodium chloride  3 mL Intravenous Q12H    ROS: Out of a complete 14 system review, the patient complains of  only the following symptoms, and all other reviewed systems are negative. + tremor, confusion  Physical Examination: Filed Vitals:   01/12/15 0500  BP:   Pulse:   Temp: 98.3 F (36.8 C)  Resp:    Physical Exam  Constitutional: He appears well-developed and well-nourished.  Psych: Affect appropriate to situation Eyes: No scleral injection HENT: No OP obstrucion Head: Normocephalic.  Cardiovascular: Normal rate and regular rhythm.  Respiratory: Effort normal and breath sounds  normal.  GI: Soft. Bowel sounds are normal. No distension. There is no tenderness.  Skin: WDI  Neurologic Examination Mental Status: Alert, oriented x 2.  Speech fluent without evidence of aphasia. Moderate hypophonia and dysarthria.    Cranial Nerves: II: visual fields grossly normal, pupils equal, round, reactive to light and accommodation III,IV, VI: ptosis not present, extra-ocular motions intact bilaterally V,VII: smile symmetric, facial light touch sensation normal bilaterally VIII: hearing normal bilaterally IX,X: gag reflex present XI: trapezius strength/neck flexion strength normal bilaterally XII: tongue strength normal  Motor: Right : Upper extremity    Left:     Upper extremity  5/5 strength     5/5 strength Right LE: 5/5 strength     Left LE: 5/5 strength Increased tone/cogwheel rigidity bilateral UE, mild resting tremor and moderate bradykinesia noted Tone and bulk:normal tone throughout; no atrophy noted Sensory: Pinprick and light touch intact throughout, bilaterally Deep Tendon Reflexes: 1+ and symmetric throughout Plantars: Right: downgoing   Left: downgoing  Laboratory Studies:   Basic Metabolic Panel:  Recent Labs Lab 01/08/15 1519 01/08/15 2200 01/09/15 0351 01/10/15 0300 01/11/15 0255 01/12/15 0531  NA 140  --  139 140 140 142  K 4.5  --  3.6 3.6 3.8 3.8  CL 102  --  101 103 106 107  CO2 29  --  31 29 26 25   GLUCOSE 96  --  100* 87 107* 95  BUN 31*  --  26* 25* 23* 24*  CREATININE 2.35* 2.13* 1.90* 1.64* 1.55* 1.81*  CALCIUM 9.2  --  8.7* 8.8* 9.0 9.2  MG  --   --   --   --   --  2.2  PHOS  --   --   --   --   --  3.3    Liver Function Tests:  Recent Labs Lab 01/09/15 0351  AST 21  ALT 24  ALKPHOS 74  BILITOT 1.1  PROT 6.1*  ALBUMIN 3.2*   No results for input(s): LIPASE, AMYLASE in the last 168 hours. No results for input(s): AMMONIA in the last 168 hours.  CBC:  Recent Labs Lab 01/08/15 1519 01/08/15 2200 01/09/15 0351   WBC 6.8 5.6 5.5  HGB 12.0* 10.9* 10.9*  HCT 36.8* 34.0* 34.1*  MCV 87.8 87.9 89.3  PLT 189 148* 150    Cardiac Enzymes:  Recent Labs Lab 01/08/15 1519 01/08/15 2200 01/09/15 0351  TROPONINI 0.03 0.03 0.03    BNP: Invalid input(s): POCBNP  CBG:  Recent Labs Lab 01/12/15 0401 01/12/15 0551  GLUCAP 179* 111*    Microbiology: Results for orders placed or performed during the hospital encounter of 06/01/14  MRSA PCR Screening     Status: None   Collection Time: 06/02/14  9:21 PM  Result Value Ref Range Status   MRSA by PCR NEGATIVE NEGATIVE Final    Comment:        The GeneXpert MRSA Assay (FDA approved for NASAL specimens only), is one component of a comprehensive MRSA colonization surveillance program.  It is not intended to diagnose MRSA infection nor to guide or monitor treatment for MRSA infections.     Coagulation Studies: No results for input(s): LABPROT, INR in the last 72 hours.  Urinalysis:  Recent Labs Lab 01/08/15 2024 01/12/15 0410  COLORURINE YELLOW AMBER*  LABSPEC 1.009 1.019  PHURINE 7.0 8.5*  GLUCOSEU NEGATIVE NEGATIVE  HGBUR NEGATIVE NEGATIVE  BILIRUBINUR NEGATIVE NEGATIVE  KETONESUR NEGATIVE NEGATIVE  PROTEINUR NEGATIVE 30*  UROBILINOGEN 1.0 1.0  NITRITE NEGATIVE POSITIVE*  LEUKOCYTESUR NEGATIVE SMALL*    Lipid Panel:  No results found for: CHOL, TRIG, HDL, CHOLHDL, VLDL, LDLCALC  HgbA1C: No results found for: HGBA1C  Urine Drug Screen:  No results found for: LABOPIA, COCAINSCRNUR, LABBENZ, AMPHETMU, THCU, LABBARB  Alcohol Level: No results for input(s): ETH in the last 168 hours.  Other results:  Imaging: No results found.   Assessment/Plan:  81y/o hx of CHF, PD presenting with increased dyspnea related to CHF found to have recurrent episodes of generalized tremors without loss of consciousness or loss of bowel/bladder. Have low suspicion these episodes represent a true seizure. Suspect they are related to his PD  with underlying UTI.  Per review of [facility] records, his PD regimen is amantadine 100mg  BID, Neupro patch 6mg  and he recently had Mirapex added. Of note he started tapering off Cymbalta on 7/25. Withdrawal of cymbalta could potentially account for some of his symptoms.   -will check EEG -continue home dose and regimen of PD medications -UTI treatment per primary team -will continue to follow   Jim Like, DO [facility] [phone]  If 7pm- 7am, please page neurology on call as listed in AMION. 01/12/2015, 3:43 PM

## 2015-01-12 NOTE — Progress Notes (Signed)
Shortly after Ativan 0.5 mg IV was given pt started having tremor episodes where his whole body would shake, had these before during the day with family at bedside, which MD was aware, also had these episodes before the medicine was given tonight but not as long and not as severe, seemed to be triggered by anxiety due to pt being confused, combative, and trying to get out of bed, VS WNL, MD and rapid respons notified, will continue to monitor, Thanks Arvella Nigh  RN.

## 2015-01-12 NOTE — Progress Notes (Signed)
ANTIBIOTIC CONSULT NOTE - INITIAL  Pharmacy Consult for Levaquin Indication: UTI  No Known Allergies   Estimated Creatinine Clearance: 42.4 mL/min (by C-G formula based on Cr of 1.55).    81yo male admitted for SOB and acute on CHF, now w/ abnormal UA, to begin IV ABX.  Will start Levaquin 750mg  IV Q48H and monitor CBC, Cx, levels prn.   Wynona Neat, PharmD, BCPS  01/12/2015,5:59 AM

## 2015-01-12 NOTE — Progress Notes (Signed)
[facility] PROGRESS NOTE  Thomas [name] [id number]:[id number] DOB: 08-Mar-1934 DOA: 01/08/2015 PCP: Madaline Brilliant, MD  Assessment/Plan: 1-acute on chronic systolic heart failure: EF 25-30% -SOB and swelling significantly improved; will switch to PO lasix. Initially Plan was to discharge on lasix 30 mg daily; but given slight increase in Cr, will change to 20mg  daily. (discussed with Dr. Benjamine Mola Heart failure service) -continue strict I's and O's -follow low sodium diet -continue toprol -no ACE/ARB due to chronic renal failure  2- a. Fib: continue pacerone -CHADsVASC score 4 -on ASA and plavix -high risk for falling due to parkinson's disease and no good candidate for anticoagulation  3-hx of CAD: no CP -troponin neg -continue ASA, plavix, and b-blockers  4-HLD: will hold on statins; given ongoing worsening tremors and concerns for increase chances of rhabdo  5-hx of parkinson's: will continue mirapex and amantadine  -needs physical reconditioning  -neurology called, due to worsening on tremors. Will follow any rec's -most likely infection playing a role and change in time for his home meds.  6-depression: continue cymbalta  7-GERD; continue PPI  8-RLE swelling/redness: no DVT and no true signs of infection appreciated -improved/resolved with diuresis -no abx's needed -due to swelling/stasis   9-CKD stage III: stable and in fact better than at baseline after diuresis -will follow BMET -Cr 1.8 on 8/1  10-physical deconditioning: will need SNF. -SW consulted  11-UTI:  -culture pending -will continue empirically treatment with levaquin   Code Status: Full Family Communication: no family at bedside Disposition Plan: found to be in need of SNF due to physical deconditioning; most likely nursing home in am.   Consultants:  Neurology   Procedures:  LE duplex: negative for DVT   V/Q scan: no PE  Antibiotics:  None   HPI/Subjective: No CP, no fever, no  nausea, no vomiting and no SOB. Patient with worsening tremors in the last 2 days. Family concerned for seizures (but patient able to talk while episodes present; also w/o LOC or post-ictal period, no bowel or urine incontinence, no tongue bite, no abnormalities on telemetry)  Objective: Filed Vitals:   01/12/15 0500  BP:   Pulse:   Temp: 98.3 F (36.8 C)  Resp:     Intake/Output Summary (Last 24 hours) at 01/12/15 1409 Last data filed at 01/12/15 0834  Gross per 24 hour  Intake    240 ml  Output    500 ml  Net   -260 ml   Filed Weights   01/09/15 0700 01/10/15 0500 01/11/15 0247  Weight: 83.2 kg (183 lb 6.8 oz) 83.008 kg (183 lb) 83.5 kg (184 lb 1.4 oz)    Exam:   General:  Afebrile, no CP, no SOB  and no pain on his RLE swelling. Patient is AAOX2. Experienced increase tremors on 7/31 and was found to have UTI on UA. Patient denies dysuria.  Cardiovascular: S1 and S2, no rubs, no gallops, no JVD  Respiratory: improve air movement, no wheezing, no crackles.   Abdomen: soft, NT, ND, positive BS  Musculoskeletal: no edema bilaterally, no open wounds; RLE with very mild erythema, no pain.  Data Reviewed: Basic Metabolic Panel:  Recent Labs Lab 01/08/15 1519 01/08/15 2200 01/09/15 0351 01/10/15 0300 01/11/15 0255 01/12/15 0531  NA 140  --  139 140 140 142  K 4.5  --  3.6 3.6 3.8 3.8  CL 102  --  101 103 106 107  CO2 29  --  31 29 26 25   GLUCOSE 96  --  100* 87 107* 95  BUN 31*  --  26* 25* 23* 24*  CREATININE 2.35* 2.13* 1.90* 1.64* 1.55* 1.81*  CALCIUM 9.2  --  8.7* 8.8* 9.0 9.2  MG  --   --   --   --   --  2.2  PHOS  --   --   --   --   --  3.3   Liver Function Tests:  Recent Labs Lab 01/09/15 0351  AST 21  ALT 24  ALKPHOS 74  BILITOT 1.1  PROT 6.1*  ALBUMIN 3.2*   CBC:  Recent Labs Lab 01/08/15 1519 01/08/15 2200 01/09/15 0351  WBC 6.8 5.6 5.5  HGB 12.0* 10.9* 10.9*  HCT 36.8* 34.0* 34.1*  MCV 87.8 87.9 89.3  PLT 189 148* 150    Cardiac Enzymes:  Recent Labs Lab 01/08/15 1519 01/08/15 2200 01/09/15 0351  TROPONINI 0.03 0.03 0.03   BNP (last 3 results)  Recent Labs  06/13/14 1038 01/08/15 1519 01/08/15 2200  BNP 740.2* 373.8* 538.7*    ProBNP (last 3 results)  Recent Labs  08/12/14 1254 12/03/14 1557  PROBNP 400.0* 375.0*    Studies: No results found.  Scheduled Meds: . amantadine  100 mg Oral BID  . amiodarone  200 mg Oral Daily  . aspirin EC  81 mg Oral Daily  . clopidogrel  75 mg Oral Daily  . DULoxetine  30 mg Oral QODAY  . furosemide  30 mg Oral Daily  . [START ON 01/14/2015] levofloxacin  750 mg Oral Q48H  . metoprolol succinate  12.5 mg Oral Daily  . mupirocin ointment   Topical Daily  . pantoprazole  40 mg Oral Daily  . pramipexole  0.5 mg Oral TID  . rotigotine  1 patch Transdermal Daily  . simvastatin  20 mg Oral q1800  . sodium chloride  3 mL Intravenous Q12H   Continuous Infusions:   Principal Problem:   Shortness of breath dyspnea Active Problems:   Essential hypertension   Non-ischemic cardiomyopathy-EF 25-30%   CAD-single vessel (RCA) in 2012   CAD (coronary artery disease), native coronary artery   Parkinson disease   Worsening renal function   Time spent: 30 minutes   Barton Dubois  [facility] Pager [phone]. If 7PM-7AM, please contact night-coverage at [email or web address], password [facility] 01/12/2015, 2:09 PM  LOS: 3 days

## 2015-01-12 NOTE — Progress Notes (Signed)
Called by the primary RN to see the pt for shaking.  On arrival to the room the pt is diaphoretic and having full body shaking.  He is alert during the episodes and knows when they are about to occur.  The initial tremors appear to be Parkinson's tremors but then proceed to become large muscle movements similar to seizure activity.  Pt is awake during the entire episode.  HR 130s, BP stable, CBG 170s.  Updated Walden Field, NP. Will cont. To monitor.

## 2015-01-12 NOTE — Progress Notes (Signed)
Pt acting more confused tonight trying to get out of bed, believes he is in his house and the RN is a stranger in his house, starting to get a little combative, VS WNL, called MD, ordered IV med to help with his confusion, will continue to monitor, thanks Coyle

## 2015-01-12 NOTE — Care Management Important Message (Signed)
Important Message  Patient Details  Name: Thomas [name] MRN: [id number] Date of Birth: 1934-01-16   Medicare Important Message Given:  Yes-second notification given    Pricilla Handler 01/12/2015, 12:29 PM

## 2015-01-13 ENCOUNTER — Inpatient Hospital Stay (HOSPITAL_COMMUNITY): Payer: Medicare Other

## 2015-01-13 DIAGNOSIS — F329 Major depressive disorder, single episode, unspecified: Secondary | ICD-10-CM

## 2015-01-13 DIAGNOSIS — R5381 Other malaise: Secondary | ICD-10-CM

## 2015-01-13 LAB — URINE CULTURE

## 2015-01-13 LAB — BASIC METABOLIC PANEL
ANION GAP: 13 (ref 5–15)
BUN: 31 mg/dL — ABNORMAL HIGH (ref 6–20)
CO2: 25 mmol/L (ref 22–32)
Calcium: 9.3 mg/dL (ref 8.9–10.3)
Chloride: 104 mmol/L (ref 101–111)
Creatinine, Ser: 1.78 mg/dL — ABNORMAL HIGH (ref 0.61–1.24)
GFR calc Af Amer: 40 mL/min — ABNORMAL LOW (ref >60)
GFR, EST NON AFRICAN AMERICAN: 35 mL/min — AB (ref >60)
GLUCOSE: 79 mg/dL (ref 65–99)
POTASSIUM: 3.6 mmol/L (ref 3.5–5.1)
Sodium: 142 mmol/L (ref 135–145)

## 2015-01-13 LAB — CK: CK TOTAL: 590 U/L — AB (ref 49–397)

## 2015-01-13 LAB — MAGNESIUM: Magnesium: 2.3 mg/dL (ref 1.7–2.4)

## 2015-01-13 NOTE — Progress Notes (Signed)
Pt getting more confused and agitated, trying to get out of bed, yelling at staff members. Lap belt restraints applied per order.

## 2015-01-13 NOTE — Progress Notes (Signed)
Prolonged EEG initiated about 1400.

## 2015-01-13 NOTE — Progress Notes (Addendum)
[facility] PROGRESS NOTE  Thomas [name] JSE:[id number] DOB: 01/22/1934 DOA: 01/08/2015 PCP: Madaline Brilliant, MD  Interim summary 81 y.o. male with a past medical history of hypertension, CAD, status post biventricular cardiac pacemaker insertion, hypothyroidism, BPH, Parkinson's disease who comes to the ER with a history of progressively worse dyspnea for about a week, particularly on exertion. This is associated with worsening edema of the lower extremities, decreased appetite, fatigue and some trouble sleeping. Hospitalization complicated with worsening tremors and UTI. See below for details. Patient is weak and will need SNF at discharge  Assessment/Plan: 1-acute on chronic systolic heart failure: EF 25-30% -SOB and swelling significantly improved; will switch to PO lasix. Initially Plan was to discharge on lasix 30 mg daily; but given slight increase in Cr, will continue lasix  20mg  daily. (discussed with Dr. Benjamine Mola Heart failure service) -continue strict I's and O's -follow low sodium diet -continue toprol -no ACE/ARB due to chronic renal failure  2- a. Fib: continue pacerone -CHADsVASC score 4 -on ASA and plavix -high risk for falling due to parkinson's disease and no good candidate for anticoagulation  3-hx of CAD: no CP -troponin neg -continue ASA, plavix, and b-blockers  4-HLD: will hold on statins; given ongoing worsening tremors and concerns for increase chances of rhabdo -follow CK  5-hx of parkinson's:  -will continue mirapex, neupro and amantadine  -needs physical reconditioning  -neurology called, due to worsening on tremors. Will follow any rec's. EEG neg -most likely infection playing a role and change in time for his home meds.  6-depression: continue cymbalta (since 7/25 according to family they initiated process of weaning off cymbalta and patient is taking reduce dose)  7-GERD; continue PPI  8-RLE swelling/redness: no DVT and no true signs of  infection appreciated -improved/resolved with diuresis -no abx's needed for this problem -due to swelling/stasis   9-CKD stage III: stable and in fact better than at baseline after diuresis -will follow BMET -Cr 1.78 on 8/2  10-physical deconditioning: will need SNF. -SW consulted  11-UTI:  -culture with multiple microorganism; non-specific isolation -will continue empirically treatment with levaquin (complete a total of 10 days) -denies dysuria and currently afebrile   Code Status: Full Family Communication: daughter Abigail Butts by phone Disposition Plan: need SNF at discharge. Has required restrains and sitter in the last 2 nights. Also given worsening in mental status and increase tremors, neurology called and holding discharge.  Consultants:  Neurology   Procedures:  LE duplex: negative for DVT   V/Q scan: no PE  EEG: no epileptiform activity   Antibiotics:  levaquin   HPI/Subjective: No CP, no fever, no nausea, no vomiting and no SOB. Patient with worsening tremors in the last 2-3 days. Family concerned for seizures (but patient able to talk while episodes present; also w/o LOC or post-ictal period, no bowel or urine incontinence, no tongue bite, no abnormalities on telemetry). EEG neg for epileptiform activity. Neurology following along.  Objective: Filed Vitals:   01/13/15 0909  BP: 112/63  Pulse: 73  Temp: 98 F (36.7 C)  Resp: 18    Intake/Output Summary (Last 24 hours) at 01/13/15 1219 Last data filed at 01/13/15 1104  Gross per 24 hour  Intake   1080 ml  Output    901 ml  Net    179 ml   Filed Weights   01/10/15 0500 01/11/15 0247 01/13/15 0558  Weight: 83.008 kg (183 lb) 83.5 kg (184 lb 1.4 oz) 78.9 kg (173 lb 15.1 oz)  Exam:   General:  Afebrile, no CP, no SOB, no Le swelling or erythema. Patient is AAOX2 (but confuse regarding situation and having difficulties following commands). Continue experiencing tremors and having sensation of falling.  Patient denies dysuria.  Cardiovascular: S1 and S2, no rubs, no gallops, no JVD  Respiratory: improve air movement, no wheezing, no crackles.   Abdomen: soft, NT, ND, positive BS  Musculoskeletal: no edema bilaterally, no open wounds; RLE with very mild erythema, no pain.  Data Reviewed: Basic Metabolic Panel:  Recent Labs Lab 01/09/15 0351 01/10/15 0300 01/11/15 0255 01/12/15 0531 01/13/15 0432  NA 139 140 140 142 142  K 3.6 3.6 3.8 3.8 3.6  CL 101 103 106 107 104  CO2 31 29 26 25 25   GLUCOSE 100* 87 107* 95 79  BUN 26* 25* 23* 24* 31*  CREATININE 1.90* 1.64* 1.55* 1.81* 1.78*  CALCIUM 8.7* 8.8* 9.0 9.2 9.3  MG  --   --   --  2.2 2.3  PHOS  --   --   --  3.3  --    Liver Function Tests:  Recent Labs Lab 01/09/15 0351  AST 21  ALT 24  ALKPHOS 74  BILITOT 1.1  PROT 6.1*  ALBUMIN 3.2*   CBC:  Recent Labs Lab 01/08/15 1519 01/08/15 2200 01/09/15 0351  WBC 6.8 5.6 5.5  HGB 12.0* 10.9* 10.9*  HCT 36.8* 34.0* 34.1*  MCV 87.8 87.9 89.3  PLT 189 148* 150   Cardiac Enzymes:  Recent Labs Lab 01/08/15 1519 01/08/15 2200 01/09/15 0351  TROPONINI 0.03 0.03 0.03   BNP (last 3 results)  Recent Labs  06/13/14 1038 01/08/15 1519 01/08/15 2200  BNP 740.2* 373.8* 538.7*    ProBNP (last 3 results)  Recent Labs  08/12/14 1254 12/03/14 1557  PROBNP 400.0* 375.0*    Studies: No results found.  Scheduled Meds: . amantadine  100 mg Oral BID  . amiodarone  200 mg Oral Daily  . aspirin EC  81 mg Oral Daily  . clopidogrel  75 mg Oral Daily  . DULoxetine  30 mg Oral QODAY  . furosemide  20 mg Oral Daily  . [START ON 01/14/2015] levofloxacin  750 mg Oral Q48H  . metoprolol succinate  12.5 mg Oral Daily  . mupirocin ointment   Topical Daily  . pantoprazole  40 mg Oral Daily  . pramipexole  0.5 mg Oral TID  . rotigotine  1 patch Transdermal Daily  . sodium chloride  3 mL Intravenous Q12H   Continuous Infusions:   Principal Problem:   Shortness of  breath dyspnea Active Problems:   Essential hypertension   Non-ischemic cardiomyopathy-EF 25-30%   CAD-single vessel (RCA) in 2012   CAD (coronary artery disease), native coronary artery   Parkinson disease   Worsening renal function   Time spent: 30 minutes   Barton Dubois  [facility] Pager [phone]. If 7PM-7AM, please contact night-coverage at [email or web address], password [facility] 01/13/2015, 12:19 PM  LOS: 4 days

## 2015-01-13 NOTE — Progress Notes (Signed)
Subjective: patient having frequent events of sudden onset sensation as if he is falling.  During episode he is holding both railings tightly, tremulous, talking in a dysarthric mumbling voice and not making clear sense when speaking.  He is able to look at me when talking but follows only intermittent simple commands such as showing me his thumb. EEG was performed and showed no epileptiform activity during events.  Patient remains Afebrile.   Objective: Current vital signs: BP 112/63 mmHg  Pulse 73  Temp(Src) 98 F (36.7 C) (Oral)  Resp 18  Ht 6' (1.829 m)  Wt 78.9 kg (173 lb 15.1 oz)  BMI 23.59 kg/m2  SpO2 98% Vital signs in last 24 hours: Temp:  [97.5 F (36.4 C)-98.4 F (36.9 C)] 98 F (36.7 C) (08/02 0909) Pulse Rate:  [59-90] 73 (08/02 0909) Resp:  [18] 18 (08/02 0909) BP: (106-129)/(63-92) 112/63 mmHg (08/02 0909) SpO2:  [95 %-99 %] 98 % (08/02 0909) Weight:  [78.9 kg (173 lb 15.1 oz)] 78.9 kg (173 lb 15.1 oz) (08/02 0558)  Intake/Output from previous day: 08/01 0701 - 08/02 0700 In: 840 [P.O.:840] Out: 900 [Urine:900] Intake/Output this shift: Total I/O In: 360 [P.O.:360] Out: -  Nutritional status: Diet 2 gram sodium Room service appropriate?: Yes; Fluid consistency:: Thin  Neurologic Exam: General: Mental Status: Alert, not oriented.  Speech dysarthric and non-sensical.  Able to follow only very simple commands such as showing his thumb and this is not consistent. Cranial Nerves: II: blinks to threat bilaterally, pupils equal, round, reactive to light and accommodation III,IV, VI: ptosis not present, extra-ocular motions intact bilaterally V,VII: face symmetric, facial light touch sensation normal bilaterally VIII: hearing normal bilaterally XI: bilateral shoulder shrug   Motor: Right : Upper extremity   5/5    Left:     Upper extremity   5/5  Lower extremity   5/5     Lower extremity   5/5 Tone and bulk:normal tone throughout; no atrophy noted Sensory:  Pinprick and light touch intact throughout, bilaterally Deep Tendon Reflexes:  Depressed throughout however patient would not relax.   Plantars: Right: downgoing   Left: downgoing    Lab Results: Basic Metabolic Panel:  Recent Labs Lab 01/09/15 0351 01/10/15 0300 01/11/15 0255 01/12/15 0531 01/13/15 0432  NA 139 140 140 142 142  K 3.6 3.6 3.8 3.8 3.6  CL 101 103 106 107 104  CO2 31 29 26 25 25   GLUCOSE 100* 87 107* 95 79  BUN 26* 25* 23* 24* 31*  CREATININE 1.90* 1.64* 1.55* 1.81* 1.78*  CALCIUM 8.7* 8.8* 9.0 9.2 9.3  MG  --   --   --  2.2 2.3  PHOS  --   --   --  3.3  --     Liver Function Tests:  Recent Labs Lab 01/09/15 0351  AST 21  ALT 24  ALKPHOS 74  BILITOT 1.1  PROT 6.1*  ALBUMIN 3.2*   No results for input(s): LIPASE, AMYLASE in the last 168 hours. No results for input(s): AMMONIA in the last 168 hours.  CBC:  Recent Labs Lab 01/08/15 1519 01/08/15 2200 01/09/15 0351  WBC 6.8 5.6 5.5  HGB 12.0* 10.9* 10.9*  HCT 36.8* 34.0* 34.1*  MCV 87.8 87.9 89.3  PLT 189 148* 150    Cardiac Enzymes:  Recent Labs Lab 01/08/15 1519 01/08/15 2200 01/09/15 0351  TROPONINI 0.03 0.03 0.03    Lipid Panel: No results for input(s): CHOL, TRIG, HDL, CHOLHDL, VLDL, LDLCALC in  the last 168 hours.  CBG:  Recent Labs Lab 01/12/15 0401 01/12/15 0551  GLUCAP 179* 111*    Microbiology: Results for orders placed or performed during the hospital encounter of 06/01/14  MRSA PCR Screening     Status: None   Collection Time: 06/02/14  9:21 PM  Result Value Ref Range Status   MRSA by PCR NEGATIVE NEGATIVE Final    Comment:        The GeneXpert MRSA Assay (FDA approved for NASAL specimens only), is one component of a comprehensive MRSA colonization surveillance program. It is not intended to diagnose MRSA infection nor to guide or monitor treatment for MRSA infections.     Coagulation Studies: No results for input(s): LABPROT, INR in the last  72 hours.  Imaging: No results found.  Medications:  Scheduled: . amantadine  100 mg Oral BID  . amiodarone  200 mg Oral Daily  . aspirin EC  81 mg Oral Daily  . clopidogrel  75 mg Oral Daily  . DULoxetine  30 mg Oral QODAY  . furosemide  20 mg Oral Daily  . [START ON 01/14/2015] levofloxacin  750 mg Oral Q48H  . metoprolol succinate  12.5 mg Oral Daily  . mupirocin ointment   Topical Daily  . pantoprazole  40 mg Oral Daily  . pramipexole  0.5 mg Oral TID  . rotigotine  1 patch Transdermal Daily  . sodium chloride  3 mL Intravenous Q12H    Assessment/Plan:   81y/o hx of CHF, PD presenting with increased dyspnea related to CHF found to have recurrent episodes of generalized tremors without loss of consciousness or loss of bowel/bladder. Have low suspicion these episodes represent a true seizure. Suspect they are related to his PD with underlying UTI.   Per review of [facility] records, his PD regimen is amantadine 100mg  BID, Neupro patch 6mg  and he recently had Mirapex added. Of note he started tapering off Cymbalta on 7/25. Withdrawal of cymbalta could potentially account for some of his symptoms.   Routine EEG unremarkable. Will plan for prolonged EEG to capture episode, though low suspicion that these represent true ictal event.   -continue home dose and regimen of PD medications -UTI treatment per primary team -will continue to follow     Jim Like, DO [facility] [phone]  If 7pm- 7am, please page neurology on call as listed in [place].  Etta Quill PA-C [facility] [phone]  01/13/2015, 10:13 AM

## 2015-01-13 NOTE — Progress Notes (Signed)
Prolonged EEG completed, results pending. 

## 2015-01-13 NOTE — Procedures (Signed)
History: 81 yo M with intermittent sensation of falling.   Sedation: None  Technique: This is a 19 channel routine scalp EEG performed at the bedside with bipolar and monopolar montages arranged in accordance to the international 10/20 system of electrode placement. One channel was dedicated to EKG recording.    Background: The background consists of intermixed alpha and beta activities. There is a well defined posterior dominant rhythm of 10 Hz that attenuates with eye opening. Sleep is not recorded today.   Photic stimulation: Physiologic driving is not performed  EEG Abnormalities: None  Clinical Interpretation: This normal EEG is recorded in the waking and drowsy state. There was no seizure or seizure predisposition recorded on this study. There is no EEG correlate to the patient's 6 push button events.   Roland Rack, MD [facility] [phone]  If 7pm- 7am, please page neurology on call as listed in [place].

## 2015-01-14 ENCOUNTER — Inpatient Hospital Stay (HOSPITAL_COMMUNITY): Payer: Medicare Other

## 2015-01-14 DIAGNOSIS — R404 Transient alteration of awareness: Secondary | ICD-10-CM | POA: Insufficient documentation

## 2015-01-14 LAB — T4, FREE: Free T4: 2.16 ng/dL — ABNORMAL HIGH (ref 0.61–1.12)

## 2015-01-14 LAB — TSH: TSH: 0.117 u[IU]/mL — ABNORMAL LOW (ref 0.350–4.500)

## 2015-01-14 LAB — AMMONIA: AMMONIA: 41 umol/L — AB (ref 9–35)

## 2015-01-14 MED ORDER — CARBIDOPA-LEVODOPA 25-100 MG PO TABS
1.0000 | ORAL_TABLET | Freq: Three times a day (TID) | ORAL | Status: DC
  Administered 2015-01-14 – 2015-01-15 (×4): 1 via ORAL
  Filled 2015-01-14 (×7): qty 1

## 2015-01-14 MED ORDER — LACTULOSE 10 GM/15ML PO SOLN
20.0000 g | Freq: Two times a day (BID) | ORAL | Status: DC
  Administered 2015-01-14 – 2015-01-15 (×3): 20 g via ORAL
  Filled 2015-01-14 (×5): qty 30

## 2015-01-14 MED ORDER — CARBIDOPA-LEVODOPA 25-100 MG PO TABS
1.0000 | ORAL_TABLET | Freq: Three times a day (TID) | ORAL | Status: DC
  Filled 2015-01-14 (×3): qty 1

## 2015-01-14 MED ORDER — RESOURCE THICKENUP CLEAR PO POWD
ORAL | Status: DC | PRN
  Filled 2015-01-14: qty 125

## 2015-01-14 MED ORDER — SULFAMETHOXAZOLE-TRIMETHOPRIM 800-160 MG PO TABS
1.0000 | ORAL_TABLET | Freq: Two times a day (BID) | ORAL | Status: DC
Start: 2015-01-14 — End: 2015-01-15
  Administered 2015-01-14 – 2015-01-15 (×3): 1 via ORAL
  Filled 2015-01-14 (×4): qty 1

## 2015-01-14 MED ORDER — LEVOFLOXACIN 750 MG PO TABS
750.0000 mg | ORAL_TABLET | ORAL | Status: DC
  Filled 2015-01-14: qty 1

## 2015-01-14 MED ORDER — PRAMIPEXOLE DIHYDROCHLORIDE 0.25 MG PO TABS
0.2500 mg | ORAL_TABLET | Freq: Three times a day (TID) | ORAL | Status: DC
  Administered 2015-01-14 (×2): 0.25 mg via ORAL
  Filled 2015-01-14 (×5): qty 1

## 2015-01-14 NOTE — Evaluation (Signed)
Clinical/Bedside Swallow Evaluation Patient Details  Name: Thomas [name] MRN: [id number] Date of Birth: 1933/07/24  Today's Date: 01/14/2015 Time: SLP Start Time (ACUTE ONLY): 1435 SLP Stop Time (ACUTE ONLY): 1505 SLP Time Calculation (min) (ACUTE ONLY): 30 min  Past Medical History:  Past Medical History  Diagnosis Date  . Coronary artery disease     80% distal RCA stenosis.  . Thyroid disease     HYPOTHYROIDISM  . Nontoxic multinodular goiter   . Psychosexual dysfunction with inhibited sexual excitement     ERECTILE DYSFUNCTION  . Hypertrophy of prostate without urinary obstruction and other lower urinary tract symptoms (LUTS)     BPH  . Hypertension   . Wide-complex tachycardia   . Parkinson's disease   . Status post biventricular cardiac pacemaker insertion     Status post CRT-D.   Past Surgical History:  Past Surgical History  Procedure Laterality Date  . Cardiac catheterization    . Cardioversion N/A 06/03/2014    Procedure: CARDIOVERSION;  Surgeon: Thompson Grayer, MD;  Location: [facility] CATH LAB;  Service: Cardiovascular;  Laterality: N/A;  . Left heart catheterization with coronary angiogram N/A 06/10/2014    Procedure: LEFT HEART CATHETERIZATION WITH CORONARY ANGIOGRAM;  Surgeon: Sinclair Grooms, MD;  Location: [facility] CATH LAB;  Service: Cardiovascular;  Laterality: N/A;  . Abdominal angiogram  06/10/2014    Procedure: ABDOMINAL ANGIOGRAM;  Surgeon: Sinclair Grooms, MD;  Location: [facility] CATH LAB;  Service: Cardiovascular;;  . Percutaneous coronary stent intervention (pci-s) N/A 06/12/2014    Procedure: PERCUTANEOUS CORONARY STENT INTERVENTION (PCI-S);  Surgeon: Sinclair Grooms, MD;  Location: [facility] CATH LAB;  Service: Cardiovascular;  Laterality: N/A;  . Percutaneous coronary rotoblator intervention (pci-r)  06/12/2014    Procedure: PERCUTANEOUS CORONARY ROTOBLATOR INTERVENTION (PCI-R);  Surgeon: Sinclair Grooms, MD;  Location: [facility] CATH LAB;  Service: Cardiovascular;;  . Cardiac  catheterization  06/12/2014    Procedure: CORONARY STENT INTERVENTION W/IMPELLA;  Surgeon: Sinclair Grooms, MD;  Location: [facility] CATH LAB;  Service: Cardiovascular;;   HPI:  81 y.o. male with a past medical history of hypertension, CAD, status post biventricular cardiac pacemaker insertion, hypothyroidism, BPH, Parkinson's disease who comes to the ER with a history of progressively worse dyspnea for about a week, particularly on exertion. Swallow evaluation was ordered as sitter noted coughing with thin liquid intake.   Assessment / Plan / Recommendation Clinical Impression  Pt has immediate coughing with thin liquids that is not observed with nectar thick liquids or solids. He does have prolonged mastication and incomplete oral clearance with solids. Mod cues from SLP for liquid and puree washes reduce residuals. Recommend Dys 3 diet and nectar thick liquids. SLP to f/u for tolerance and advancement as indicated.    Aspiration Risk  Mild    Diet Recommendation Dysphagia 3 (Mech soft);Nectar   Medication Administration: Whole meds with puree Compensations: Slow rate;Small sips/bites;Check for pocketing;Follow solids with liquid    Other  Recommendations Oral Care Recommendations: Oral care BID Other Recommendations: Order thickener from pharmacy;Prohibited food (jello, ice cream, thin soups);Remove water pitcher    Follow Up Recommendations   SNF    Frequency and Duration min 2x/week  2 weeks   Pertinent Vitals/Pain n/a    SLP Swallow Goals     Swallow Study Prior Functional Status       General Other Pertinent Information: 81 y.o. male with a past medical history of hypertension, CAD, status post biventricular cardiac pacemaker insertion, hypothyroidism,  BPH, Parkinson's disease who comes to the ER with a history of progressively worse dyspnea for about a week, particularly on exertion. Swallow evaluation was ordered as sitter noted coughing with thin liquid intake. Type of  Study: Bedside swallow evaluation Previous Swallow Assessment: none in chart Diet Prior to this Study: Regular;Thin liquids Temperature Spikes Noted: No Respiratory Status: Room air History of Recent Intubation: No Behavior/Cognition: Alert;Cooperative;Pleasant mood;Requires cueing Oral Cavity - Dentition: Other (Comment) (dentures) Self-Feeding Abilities: Needs assist Patient Positioning: Upright in bed Baseline Vocal Quality: Low vocal intensity    Oral/Motor/Sensory Function     Ice Chips Ice chips: Not tested   Thin Liquid Thin Liquid: Impaired Presentation: Cup;Self Fed Pharyngeal  Phase Impairments: Suspected delayed Swallow;Cough - Immediate;Multiple swallows    Nectar Thick Nectar Thick Liquid: Impaired Presentation: Cup;Self Fed Pharyngeal Phase Impairments: Suspected delayed Swallow   Honey Thick Honey Thick Liquid: Not tested   Puree Puree: Within functional limits Presentation: Spoon   Solid    Solid: Impaired Oral Phase Impairments: Impaired anterior to posterior transit Oral Phase Functional Implications: Oral residue      Germain Osgood, M.A. CCC-SLP [phone]  Germain Osgood 01/14/2015,3:12 PM

## 2015-01-14 NOTE — Progress Notes (Addendum)
Subjective: No overnight events. Continues to have episodes of generalized shaking/jerking associated with sensation of falling and delerium.   Objective: Current vital signs: BP 114/80 mmHg  Pulse 57  Temp(Src) 97.5 F (36.4 C) (Oral)  Resp 20  Ht 6' (1.829 m)  Wt 78.9 kg (173 lb 15.1 oz)  BMI 23.59 kg/m2  SpO2 92% Vital signs in last 24 hours: Temp:  [97.5 F (36.4 C)-98.4 F (36.9 C)] 97.5 F (36.4 C) (08/03 0532) Pulse Rate:  [57-73] 57 (08/03 0532) Resp:  [18-22] 20 (08/03 0532) BP: (109-117)/(59-80) 114/80 mmHg (08/03 0532) SpO2:  [92 %-98 %] 92 % (08/03 0532)  Intake/Output from previous day: 08/02 0701 - 08/03 0700 In: 900 [P.O.:900] Out: 851 [Urine:850; Stool:1] Intake/Output this shift:   Nutritional status: Diet 2 gram sodium Room service appropriate?: Yes; Fluid consistency:: Thin  Neurologic Exam: General: Mental Status: Alert, not oriented.  Speech dysarthric and non-sensical.  Not following commands Cranial Nerves: II: blinks to threat bilaterally, pupils equal, round, reactive to light and accommodation III,IV, VI: ptosis not present, extra-ocular motions intact bilaterally V,VII: face symmetric, facial light touch sensation normal bilaterally VIII: hearing normal bilaterally XI: bilateral shoulder shrug  Motor: Right : Upper extremity   5/5    Left:     Upper extremity   5/5  Lower extremity   5/5     Lower extremity   5/5 Tone and bulk:normal tone throughout; no atrophy noted Sensory: Pinprick and light touch intact throughout, bilaterally Deep Tendon Reflexes:  Depressed throughout however patient would not relax.   Plantars: Right: downgoing   Left: downgoing    Lab Results: Basic Metabolic Panel:  Recent Labs Lab 01/09/15 0351 01/10/15 0300 01/11/15 0255 01/12/15 0531 01/13/15 0432  NA 139 140 140 142 142  K 3.6 3.6 3.8 3.8 3.6  CL 101 103 106 107 104  CO2 31 29 26 25 25   GLUCOSE 100* 87 107* 95 79  BUN 26* 25* 23* 24* 31*   CREATININE 1.90* 1.64* 1.55* 1.81* 1.78*  CALCIUM 8.7* 8.8* 9.0 9.2 9.3  MG  --   --   --  2.2 2.3  PHOS  --   --   --  3.3  --     Liver Function Tests:  Recent Labs Lab 01/09/15 0351  AST 21  ALT 24  ALKPHOS 74  BILITOT 1.1  PROT 6.1*  ALBUMIN 3.2*   No results for input(s): LIPASE, AMYLASE in the last 168 hours. No results for input(s): AMMONIA in the last 168 hours.  CBC:  Recent Labs Lab 01/08/15 1519 01/08/15 2200 01/09/15 0351  WBC 6.8 5.6 5.5  HGB 12.0* 10.9* 10.9*  HCT 36.8* 34.0* 34.1*  MCV 87.8 87.9 89.3  PLT 189 148* 150    Cardiac Enzymes:  Recent Labs Lab 01/08/15 1519 01/08/15 2200 01/09/15 0351 01/13/15 1048  CKTOTAL  --   --   --  590*  TROPONINI 0.03 0.03 0.03  --     Lipid Panel: No results for input(s): CHOL, TRIG, HDL, CHOLHDL, VLDL, LDLCALC in the last 168 hours.  CBG:  Recent Labs Lab 01/12/15 0401 01/12/15 0551  GLUCAP 179* 111*    Microbiology: Results for orders placed or performed during the hospital encounter of 01/08/15  Culture, Urine     Status: None   Collection Time: 01/12/15  4:10 AM  Result Value Ref Range Status   Specimen Description URINE, RANDOM  Final   Special Requests ADDED [id number] [phone]  Final  Culture MULTIPLE SPECIES PRESENT, SUGGEST RECOLLECTION  Final   Report Status 01/13/2015 FINAL  Final    Coagulation Studies: No results for input(s): LABPROT, INR in the last 72 hours.  Imaging: No results found.  Medications:  Scheduled: . amantadine  100 mg Oral BID  . amiodarone  200 mg Oral Daily  . aspirin EC  81 mg Oral Daily  . carbidopa-levodopa  1 tablet Oral TID  . clopidogrel  75 mg Oral Daily  . DULoxetine  30 mg Oral QODAY  . furosemide  20 mg Oral Daily  . levofloxacin  750 mg Oral Q48H  . metoprolol succinate  12.5 mg Oral Daily  . mupirocin ointment   Topical Daily  . pantoprazole  40 mg Oral Daily  . pramipexole  0.5 mg Oral TID  . rotigotine  1 patch Transdermal Daily  .  sodium chloride  3 mL Intravenous Q12H    Assessment/Plan:   81y/o hx of CHF, PD presenting with increased dyspnea related to CHF found to have recurrent episodes of generalized tremors without loss of consciousness or loss of bowel/bladder. Episodes also associated with delerium.  Unclear etiology of these continued episodes and associated delerium. Will check MRI/A head to rule out central process though this is less likely based on lack of focality on exam. Levaquin has been shown to cause encephalopathy associated with myoclonus, question if this is the etiology. Will discontinue levaquin and switch to bactrim.Question if some of his PD medications are playing a role in his delirium. Dual dopamine agonist therapy can potentially exacerbate delerium. Amantadine has also been implicated.  -will start low dose Sinemet 25-100 TID -will cut mirapex in half to 0.25mg  TID and plan to discontinue tomorrow if tolerating -will consider tapering down amantadine in the future -dc levaquin and start bactrim for UTI   Jim Like, DO [facility] [phone]  If 7pm- 7am, please page neurology on call as listed in [place].  Etta Quill PA-C [facility] [phone]  01/14/2015, 9:03 AM

## 2015-01-14 NOTE — Progress Notes (Signed)
Scheduled d/c to [facility]- in [place], [place]. Cancelled today per Dr. Tyrell Antonio. Hopefully will be stable tomorrow.  Notified Ivin Booty- Admissions at the SNF and she stated this is fine and will keep a bed for patient.  Discussed with daughter and will follow up on d/c tomorrow if patient is stable.  Lorie Phenix. Pauline Good, [place]

## 2015-01-14 NOTE — Progress Notes (Signed)
Physical Therapy Treatment Patient Details Name: Thomas [name] MRN: [id number] DOB: 1933-08-21 Today's Date: 01/14/2015    History of Present Illness 81 y.o. male with a past medical history of hypertension, CAD, status post biventricular cardiac pacemaker insertion, hypothyroidism, BPH, Parkinson's disease who comes to the ER with a history of progressively worse dyspnea for about a week, particularly on exertion    PT Comments    Pt tolerated further ambulation today but was very fatigued last 25', ambulated 65' with RW and mod A +2. Pt difficult to understand today and expresses that he is not understanding what people are telling him. PT will continue to follow.   Follow Up Recommendations  SNF;Supervision/Assistance - 24 hour     Equipment Recommendations  None recommended by PT    Recommendations for Other Services       Precautions / Restrictions Precautions Precautions: Fall Restrictions Weight Bearing Restrictions: No    Mobility  Bed Mobility Overal bed mobility: Needs Assistance Bed Mobility: Supine to Sit     Supine to sit: Min assist     General bed mobility comments: mod vc's and tactile cues needed for pt to initiate mvmt but once bed mobility initiated, pt able to get to EOB with min A.   Transfers Overall transfer level: Needs assistance Equipment used: Rolling walker (2 wheeled) Transfers: Sit to/from Stand Sit to Stand: +2 safety/equipment;Mod assist         General transfer comment: pt puts hands on RW every time, depite verbal and tactile cues not to do so. Posterior lean, 15 secs needed to get balance before intiiating gait  Ambulation/Gait Ambulation/Gait assistance: Min assist;Mod assist;+2 safety/equipment Ambulation Distance (Feet): 75 Feet Assistive device: Rolling walker (2 wheeled) Gait Pattern/deviations: Step-through pattern;Decreased stride length;Shuffle;Festinating Gait velocity: decreased with distance Gait velocity  interpretation: <1.8 ft/sec, indicative of risk for recurrent falls General Gait Details: tremor improved initially with ambulation but worsened as he fatigued. First 33' of ambulation, pt min A with RW with posterior lean but good fwd momentum. Pt required mod A, +2 for safety for last 25' due to fatigue and LE weakness. Pt very discombobulated by turning around and had difficulty after that as well as difficulty with turning to sit in chair   Stairs            Wheelchair Mobility    Modified Rankin (Stroke Patients Only)       Balance Overall balance assessment: Needs assistance Sitting-balance support: Feet supported Sitting balance-Leahy Scale: Fair Sitting balance - Comments: can maintain sitting EOB without UE support, though loses balance if he moves his legs Postural control: Posterior lean Standing balance support: Bilateral upper extremity supported Standing balance-Leahy Scale: Poor Standing balance comment: loses balance bkwds in standing, even with min A                    Cognition Arousal/Alertness: Awake/alert Behavior During Therapy: WFL for tasks assessed/performed Overall Cognitive Status: Impaired/Different from baseline Area of Impairment: Orientation;Memory;Safety/judgement;Problem solving Orientation Level: Place;Situation;Time   Memory: Decreased short-term memory   Safety/Judgement: Decreased awareness of safety;Decreased awareness of deficits Awareness: Intellectual Problem Solving: Slow processing;Decreased initiation;Difficulty sequencing;Requires verbal cues;Requires tactile cues General Comments: pt speech difficult to understand today and he reports that he does not understand what people are saying to him either. Education on safety awareness does not carry over session to session    Exercises      General Comments General comments (skin integrity, edema, etc.): pt fatigued after  session      Pertinent Vitals/Pain Pain  Assessment: No/denies pain    Home Living                      Prior Function            PT Goals (current goals can now be found in the care plan section) Acute Rehab PT Goals Patient Stated Goal: to go home PT Goal Formulation: With patient Time For Goal Achievement: 01/23/15 Potential to Achieve Goals: Fair Progress towards PT goals: Progressing toward goals    Frequency  Min 3X/week    PT Plan Current plan remains appropriate    Co-evaluation             End of Session Equipment Utilized During Treatment: Gait belt Activity Tolerance: Patient limited by fatigue Patient left: in chair;with nursing/sitter in room;with call bell/phone within reach     Time: 1034-1057 PT Time Calculation (min) (ACUTE ONLY): 23 min  Charges:  $Gait Training: 8-22 mins $Therapeutic Activity: 8-22 mins                    G Codes:     Leighton Roach, PT  Acute Rehab Services  [phone]  Leighton Roach 01/14/2015, 12:06 PM

## 2015-01-14 NOTE — Progress Notes (Signed)
[facility] PROGRESS NOTE  Thomas [name] WSF:[id number] DOB: February 24, 1934 DOA: 01/08/2015 PCP: Thomas Brilliant, MD  Interim summary 81 y.o. male with a past medical history of hypertension, CAD, status post biventricular cardiac pacemaker insertion, hypothyroidism, BPH, Parkinson's disease who comes to the ER with a history of progressively worse dyspnea for about a week, particularly on exertion. This is associated with worsening edema of the lower extremities, decreased appetite, fatigue and some trouble sleeping. Hospitalization complicated with worsening tremors and UTI. See below for details. Patient is weak and will need SNF at discharge  Assessment/Plan: 1-acute on chronic systolic heart failure: EF 25-30% -SOB and swelling significantly improved; will switch to PO lasix. Initially Plan was to discharge on lasix 30 mg daily; but given slight increase in Cr, will continue lasix  20mg  daily. (discussed with Dr. Benjamine [name] Heart failure service) -continue strict I's and O's -follow low sodium diet -continue toprol -no ACE/ARB due to chronic renal failure  2- a. Fib: continue pacerone -CHADsVASC score 4 -on ASA and plavix -high risk for falling due to parkinson's disease and no good candidate for anticoagulation  3-hx of CAD: no CP -troponin neg -continue ASA, plavix, and b-blockers  4-HLD: will hold on statins; given ongoing worsening tremors and concerns for increase chances of rhabdo -follow CK  5-Delirium, tremors.  Ammonia at 40. Will order lactulose. TSH low . Will check free T 3 and T 4.  hx of parkinson's:  -will continue mirapex, neupro and amantadine , neurology adjusting doses .  -needs physical reconditioning  -neurology called, due to worsening on tremors. EEG neg -most likely infection playing a role and change in time for his home meds.  6-depression: continue cymbalta (since 7/25 according to family they initiated process of weaning off cymbalta and patient is  taking reduce dose)  7-GERD; continue PPI  8-RLE swelling/redness: no DVT and no true signs of infection appreciated -improved/resolved with diuresis -no abx's needed for this problem -due to swelling/stasis   9-CKD stage III: stable and in fact better than at baseline after diuresis -will follow BMET -Cr 1.78 on 8/2  10-physical deconditioning: Will need SNF. -SW consulted  11-UTI:  -culture with multiple microorganism; non-specific isolation -denies dysuria and currently afebrile  Levaquin change to bactrim, due to concern for Levaquin causing delirium.   Code Status: Full Family Communication: daughter Thomas [name] by phone Disposition Plan: need SNF at discharge. Hopefully 8-4  Consultants:  Neurology   Procedures:  LE duplex: negative for DVT   V/Q scan: no PE  EEG: no epileptiform activity   Antibiotics:  levaquin   HPI/Subjective: Alert, tremors appears to have improved  Unable to perform MRI due to pacemaker.  He is alert, mildly confuse  Objective: Filed Vitals:   01/14/15 1017  BP: 106/56  Pulse: 60  Temp:   Resp:     Intake/Output Summary (Last 24 hours) at 01/14/15 1247 Last data filed at 01/14/15 1000  Gross per 24 hour  Intake    540 ml  Output    650 ml  Net   -110 ml   Filed Weights   01/10/15 0500 01/11/15 0247 01/13/15 0558  Weight: 83.008 kg (183 lb) 83.5 kg (184 lb 1.4 oz) 78.9 kg (173 lb 15.1 oz)    Exam:   General:  NAD  Cardiovascular: S1 and S2, no rubs, no gallops, no JVD  Respiratory: improve air movement, no wheezing, no crackles.   Abdomen: soft, NT, ND, positive BS  Musculoskeletal: no edema bilaterally, no open  wounds; RLE with very mild erythema, no pain.  Data Reviewed: Basic Metabolic Panel:  Recent Labs Lab 01/09/15 0351 01/10/15 0300 01/11/15 0255 01/12/15 0531 01/13/15 0432  NA 139 140 140 142 142  K 3.6 3.6 3.8 3.8 3.6  CL 101 103 106 107 104  CO2 31 29 26 25 25   GLUCOSE 100* 87 107* 95 79   BUN 26* 25* 23* 24* 31*  CREATININE 1.90* 1.64* 1.55* 1.81* 1.78*  CALCIUM 8.7* 8.8* 9.0 9.2 9.3  MG  --   --   --  2.2 2.3  PHOS  --   --   --  3.3  --    Liver Function Tests:  Recent Labs Lab 01/09/15 0351  AST 21  ALT 24  ALKPHOS 74  BILITOT 1.1  PROT 6.1*  ALBUMIN 3.2*   CBC:  Recent Labs Lab 01/08/15 1519 01/08/15 2200 01/09/15 0351  WBC 6.8 5.6 5.5  HGB 12.0* 10.9* 10.9*  HCT 36.8* 34.0* 34.1*  MCV 87.8 87.9 89.3  PLT 189 148* 150   Cardiac Enzymes:  Recent Labs Lab 01/08/15 1519 01/08/15 2200 01/09/15 0351 01/13/15 1048  CKTOTAL  --   --   --  590*  TROPONINI 0.03 0.03 0.03  --    BNP (last 3 results)  Recent Labs  06/13/14 1038 01/08/15 1519 01/08/15 2200  BNP 740.2* 373.8* 538.7*    ProBNP (last 3 results)  Recent Labs  08/12/14 1254 12/03/14 1557  PROBNP 400.0* 375.0*    Studies: No results found.  Scheduled Meds: . amantadine  100 mg Oral BID  . amiodarone  200 mg Oral Daily  . aspirin EC  81 mg Oral Daily  . carbidopa-levodopa  1 tablet Oral TID  . clopidogrel  75 mg Oral Daily  . DULoxetine  30 mg Oral QODAY  . furosemide  20 mg Oral Daily  . metoprolol succinate  12.5 mg Oral Daily  . mupirocin ointment   Topical Daily  . pantoprazole  40 mg Oral Daily  . pramipexole  0.25 mg Oral TID  . rotigotine  1 patch Transdermal Daily  . sodium chloride  3 mL Intravenous Q12H  . sulfamethoxazole-trimethoprim  1 tablet Oral Q12H   Continuous Infusions:   Principal Problem:   Shortness of breath dyspnea Active Problems:   Essential hypertension   Non-ischemic cardiomyopathy-EF 25-30%   CAD-single vessel (RCA) in 2012   CAD (coronary artery disease), native coronary artery   Parkinson disease   Worsening renal function   Time spent: 30 minutes   Thomas [name], [name] Pager [phone]. If 7PM-7AM, please contact night-coverage at [email or web address], password [facility] 01/14/2015, 12:47 PM  LOS: 5 days

## 2015-01-15 DIAGNOSIS — L8990 Pressure ulcer of unspecified site, unspecified stage: Secondary | ICD-10-CM | POA: Insufficient documentation

## 2015-01-15 LAB — AMMONIA: Ammonia: 35 umol/L (ref 9–35)

## 2015-01-15 LAB — BASIC METABOLIC PANEL
ANION GAP: 11 (ref 5–15)
BUN: 38 mg/dL — AB (ref 6–20)
CALCIUM: 9.2 mg/dL (ref 8.9–10.3)
CO2: 27 mmol/L (ref 22–32)
Chloride: 107 mmol/L (ref 101–111)
Creatinine, Ser: 1.68 mg/dL — ABNORMAL HIGH (ref 0.61–1.24)
GFR calc Af Amer: 43 mL/min — ABNORMAL LOW (ref >60)
GFR calc non Af Amer: 37 mL/min — ABNORMAL LOW (ref >60)
GLUCOSE: 117 mg/dL — AB (ref 65–99)
POTASSIUM: 3.3 mmol/L — AB (ref 3.5–5.1)
Sodium: 145 mmol/L (ref 135–145)

## 2015-01-15 LAB — T3, FREE: T3 FREE: 2.2 pg/mL (ref 2.0–4.4)

## 2015-01-15 MED ORDER — PRAMIPEXOLE DIHYDROCHLORIDE 0.25 MG PO TABS
0.2500 mg | ORAL_TABLET | Freq: Three times a day (TID) | ORAL | Status: DC
  Administered 2015-01-15: 0.25 mg via ORAL
  Filled 2015-01-15 (×3): qty 1

## 2015-01-15 MED ORDER — LACTULOSE 10 GM/15ML PO SOLN: 20.0000 g | Freq: Every day | ORAL | Status: DC | PRN

## 2015-01-15 MED ORDER — FUROSEMIDE 20 MG PO TABS: 20.0000 mg | ORAL_TABLET | Freq: Every day | ORAL | Status: DC

## 2015-01-15 MED ORDER — SULFAMETHOXAZOLE-TRIMETHOPRIM 800-160 MG PO TABS: 1.0000 | ORAL_TABLET | Freq: Two times a day (BID) | ORAL | Status: DC

## 2015-01-15 MED ORDER — CARBIDOPA-LEVODOPA 25-100 MG PO TABS: 1.0000 | ORAL_TABLET | Freq: Three times a day (TID) | ORAL | Status: AC

## 2015-01-15 MED ORDER — METHIMAZOLE 10 MG PO TABS: 10.0000 mg | ORAL_TABLET | Freq: Every day | ORAL | Status: AC

## 2015-01-15 MED ORDER — POTASSIUM CHLORIDE CRYS ER 20 MEQ PO TBCR: 40.0000 meq | EXTENDED_RELEASE_TABLET | Freq: Once | ORAL | Status: DC

## 2015-01-15 MED ORDER — METHIMAZOLE 10 MG PO TABS
10.0000 mg | ORAL_TABLET | Freq: Every day | ORAL | Status: DC
  Filled 2015-01-15: qty 1

## 2015-01-15 MED ORDER — PRAMIPEXOLE DIHYDROCHLORIDE 0.25 MG PO TABS: 0.2500 mg | ORAL_TABLET | Freq: Three times a day (TID) | ORAL | Status: DC

## 2015-01-15 NOTE — Discharge Summary (Signed)
Physician Discharge Summary  [name] EQA:[id number] DOB: 07/14/1933 DOA: 01/08/2015  PCP: Madaline Brilliant, MD  Admit date: 01/08/2015 Discharge date: 01/15/2015  Time spent: 35 minutes  Recommendations for Outpatient Follow-up:  1. Needs B-met to follow K level and renal function.  2. Needs to follow with cardiology for evaluation and titration of amiodarone.  3. Needs to follow up with endocrinologist Dr Dwyane Dee, needs repeat TSH 4. Follow up with neurology for further titration off mirapex.   Discharge Diagnoses:    Non-ischemic cardiomyopathy-EF 25-30%   Parkinson disease   Worsening renal function   Altered awareness, transient   Pressure ulcer   Shortness of breath dyspnea   Essential hypertension   CAD-single vessel (RCA) in 2012   CAD (coronary artery disease), native coronary artery    Discharge Condition: Stable  Diet recommendation: Dysphagia 3 diet, nectar thick  Filed Weights   01/11/15 0247 01/13/15 0558 01/15/15 0552  Weight: 83.5 kg (184 lb 1.4 oz) 78.9 kg (173 lb 15.1 oz) 79.2 kg (174 lb 9.7 oz)    History of present illness:  Thomas [name] is a 81 y.o. male with a past medical history of hypertension, CAD, status post biventricular cardiac pacemaker insertion, hypothyroidism, BPH, Parkinson's disease who comes to the ER with a history of progressively worse dyspnea for about a week, particularly on exertion. This is associated with worsening edema of the lower extremities, decreased appetite, fatigue and some trouble sleeping. He denies chest pain, dizziness, diaphoresis, palpitations, PND, or orthopnea.  Workup has been inconclusive in the ER and Dr. Kingsley Callander spoke to the patient's cardiologist, Dr. Daneen Schick, who did not have any other specific concerns as per the dyspnea being at the results of a CHF exacerbation. He is currently in no acute distress.  Review of Systems:   Hospital Course:  81 y.o. male with a past medical history of hypertension,  CAD, status post biventricular cardiac pacemaker insertion, hypothyroidism, BPH, Parkinson's disease who comes to the ER with a history of progressively worse dyspnea for about a week, particularly on exertion. This is associated with worsening edema of the lower extremities, decreased appetite, fatigue and some trouble sleeping. Hospitalization complicated with worsening tremors and UTI. See below for details. Patient is weak and will need SNF at discharge  Assessment/Plan: 1-acute on chronic systolic heart failure: EF 25-30% -SOB and swelling significantly improved; will switch to PO lasix. Initially Plan was to discharge on lasix 30 mg daily; but given slight increase in Cr, will continue lasix 4m daily. (discussed with Dr. MBenjamine MolaHeart failure service) -continue strict I's and O's -follow low sodium diet -continue toprol -no ACE/ARB due to chronic renal failure  2- a. Fib: continue pacerone -CHADsVASC score 4 -on ASA and plavix -high risk for falling due to parkinson's disease and no good candidate for anticoagulation  3-hx of CAD: no CP -troponin neg -continue ASA, plavix, and b-blockers  4-HLD: will hold on statins; given ongoing worsening tremors and concerns for increase chances of rhabdo -follow CK  5-Delirium, tremors.  Ammonia at 40, received lactulose. Ammonia normal. TSH low .  free T 3 normal and T 4 high. Suspect related to amiodarone. Discussed with Endocrinologist Dr KDwyane Dee Will start low dose methimazole. Needs to follow up with endocrinologist in 2 to 3 weeks. .  hx of parkinson's:  -will continue mirapex, neupro and amantadine , neurology adjusting doses .  -needs physical reconditioning  -neurology called, due to worsening on tremors. EEG neg -most likely infection playing a role  and change in time for his home meds. -started on sinemt, pramipexole decreased. This medication will need to be taper off. Needs to follow up with neurologist.   6-depression:  continue cymbalta (since 7/25 according to family they initiated process of weaning off cymbalta and patient is taking reduce dose)  7-GERD; continue PPI  8-RLE swelling/redness: no DVT and no true signs of infection appreciated -improved/resolved with diuresis -no abx's needed for this problem -due to swelling/stasis   9-CKD stage III: stable and in fact better than at baseline after diuresis -will follow BMET -Cr 1.78 on 8/2  10-physical deconditioning: Will need SNF. -SW consulted  11-UTI:  -culture with multiple microorganism; non-specific isolation -denies dysuria and currently afebrile  Levaquin change to bactrim, due to concern for Levaquin causing delirium.  Discharge on bactrim .   Procedures:    Consultations:  Neurology  Dr Dwyane Dee, phone  Discharge Exam: Filed Vitals:   01/15/15 0954  BP: 105/50  Pulse: 61  Temp: 98.7 F (37.1 C)  Resp: 20    General: Calm, no Acute distress.  Cardiovascular: S 1, S 2 RRR Respiratory: CTA  Discharge Instructions   Discharge Instructions    Diet - low sodium heart healthy    Complete by:  As directed      Increase activity slowly    Complete by:  As directed           Current Discharge Medication List    START taking these medications   Details  carbidopa-levodopa (SINEMET IR) 25-100 MG per tablet Take 1 tablet by mouth 3 (three) times daily. Qty: 30 tablet, Refills: 0    lactulose (CHRONULAC) 10 GM/15ML solution Take 30 mLs (20 g total) by mouth daily as needed for mild constipation. Qty: 240 mL, Refills: 0    methimazole (TAPAZOLE) 10 MG tablet Take 1 tablet (10 mg total) by mouth daily. Qty: 30 tablet, Refills: 0    sulfamethoxazole-trimethoprim (BACTRIM DS,SEPTRA DS) 800-160 MG per tablet Take 1 tablet by mouth every 12 (twelve) hours. Qty: 4 tablet, Refills: 0      CONTINUE these medications which have CHANGED   Details  furosemide (LASIX) 20 MG tablet Take 1 tablet (20 mg total) by mouth  daily. Qty: 30 tablet, Refills: 0    pramipexole (MIRAPEX) 0.25 MG tablet Take 1 tablet (0.25 mg total) by mouth 3 (three) times daily. Qty: 60 tablet, Refills: 0      CONTINUE these medications which have NOT CHANGED   Details  amantadine (SYMMETREL) 100 MG capsule Take 100 mg by mouth 2 (two) times daily.     amiodarone (PACERONE) 200 MG tablet Take 1 tablet (200 mg total) by mouth daily. Qty: 30 tablet, Refills: 6   Associated Diagnoses: Atrial fibrillation with RVR; On amiodarone therapy; Coronary artery disease involving native coronary artery of native heart without angina pectoris; Dyspnea; Chronic systolic heart failure    aspirin EC 81 MG EC tablet Take 1 tablet (81 mg total) by mouth daily.    clopidogrel (PLAVIX) 75 MG tablet Take 1 tablet (75 mg total) by mouth daily. Qty: 30 tablet, Refills: 11    DULoxetine (CYMBALTA) 30 MG capsule Take 30 mg by mouth every other day.     metoprolol succinate (TOPROL-XL) 12.5 mg TB24 24 hr tablet Take 0.5 tablets (12.5 mg total) by mouth daily. Qty: 30 tablet, Refills: 5    Multiple Vitamins-Minerals (ICAPS MV PO) Take 2 by mouth twice daily     nitroGLYCERIN (NITROSTAT) 0.4  MG SL tablet Place 0.4 mg under the tongue every 5 (five) minutes as needed.      Omega-3 Fatty Acids (FISH OIL) 1000 MG CAPS Take one by mouth twice daily      omeprazole (PRILOSEC) 20 MG capsule Take 1 capsule (20 mg total) by mouth daily. Qty: 30 capsule, Refills: 11    rotigotine (NEUPRO) 6 MG/24HR Place 1 patch onto the skin daily.    vitamin C (ASCORBIC ACID) 500 MG tablet Take 500 mg by mouth daily.       STOP taking these medications     potassium chloride SA (K-DUR,KLOR-CON) 20 MEQ tablet      simvastatin (ZOCOR) 20 MG tablet        No Known Allergies Follow-up Information    Follow up with Madaline Brilliant, MD.   Specialty:  Family Medicine   Contact information:   [address] [place] [address] [phone]       Follow  up with [facility], MD In 2 weeks.   Specialty:  Endocrinology   Contact information:   [place] [address] [phone]        The results of significant diagnostics from this hospitalization (including imaging, microbiology, ancillary and laboratory) are listed below for reference.    Significant Diagnostic Studies: Dg Chest 2 View  01/08/2015   CLINICAL DATA:  Shortness of breath for 2 months  EXAM: CHEST  2 VIEW  COMPARISON:  Nov 04, 2014  FINDINGS: The heart size and mediastinal contours are stable. The heart size is enlarged. The aorta is tortuous. Cardiac pacemaker is unchanged. There is central pulmonary vascular congestion without frank edema. There is no focal pneumonia or pleural effusion. Right calcified pleural plaque is unchanged. The visualized skeletal structures are stable.  IMPRESSION: Cardiomegaly and central pulmonary vascular congestion unchanged compared to prior exam. No focal pneumonia.   Electronically Signed   By: Abelardo Diesel M.D.   On: 01/08/2015 16:57   Ct Head Wo Contrast  01/08/2015   CLINICAL DATA:  Confusion, recent fall, altered mental status.  EXAM: CT HEAD WITHOUT CONTRAST  TECHNIQUE: Contiguous axial images were obtained from the base of the skull through the vertex without contrast.  COMPARISON:  None  FINDINGS: Diffuse brain atrophy and chronic white matter microvascular ischemic changes throughout the cerebral hemispheres. Remote right occipital infarct with encephalomalacia. No acute intracranial hemorrhage, definite acute infarction, mass lesion, midline shift, herniation, hydrocephalus, or extra-axial fluid collection. Cisterns are patent. Cerebellar atrophy as well. Atherosclerosis of the intracranial vessels at the skullbase. Orbits are symmetric. Mastoids and sinuses remain clear.  IMPRESSION: Atrophy, chronic white matter ischemic change, and remote right occipital PCA territory infarct.  Intracranial atherosclerosis  No  acute intracranial finding by noncontrast CT.   Electronically Signed   By: Jerilynn Mages.  Shick M.D.   On: 01/08/2015 16:37   Nm Pulmonary Perf And Vent  01/08/2015   CLINICAL DATA:  Shortness of breath.  EXAM: NUCLEAR MEDICINE VENTILATION - PERFUSION LUNG SCAN  TECHNIQUE: Ventilation images were obtained in multiple projections using inhaled aerosol Tc-25mDTPA. Perfusion images were obtained in multiple projections after intravenous injection of Tc-96mAA.  RADIOPHARMACEUTICALS:  40 mCi of Technetium-9955mPA aerosol inhalation and 6 mCi of Technetium-55m55m IV  COMPARISON:  Chest x-ray dated 01/08/2015  FINDINGS: Ventilation: No focal ventilation defect.  Perfusion: No wedge shaped peripheral perfusion defects to suggest acute pulmonary embolism.  IMPRESSION: Normal ventilation-perfusion lung scan.   Electronically Signed  By: Lorriane Shire M.D.   On: 01/08/2015 19:54    Microbiology: Recent Results (from the past 240 hour(s))  Culture, Urine     Status: None   Collection Time: 01/12/15  4:10 AM  Result Value Ref Range Status   Specimen Description URINE, RANDOM  Final   Special Requests ADDED [id number] [phone]  Final   Culture MULTIPLE SPECIES PRESENT, SUGGEST RECOLLECTION  Final   Report Status 01/13/2015 FINAL  Final     Labs: Basic Metabolic Panel:  Recent Labs Lab 01/10/15 0300 01/11/15 0255 01/12/15 0531 01/13/15 0432 01/15/15 0320  NA 140 140 142 142 145  K 3.6 3.8 3.8 3.6 3.3*  CL 103 106 107 104 107  CO2 _0 GLUCOSE 87 107* 95 79 117*  BUN 25* 23* 24* 31* 38*  CREATININE 1.64* 1.55* 1.81* 1.78* 1.68*  CALCIUM 8.8* 9.0 9.2 9.3 9.2  MG  --   --  2.2 2.3  --   PHOS  --   --  3.3  --   --    Liver Function Tests:  Recent Labs Lab 01/09/15 0351  AST 21  ALT 24  ALKPHOS 74  BILITOT 1.1  PROT 6.1*  ALBUMIN 3.2*   No results for input(s): LIPASE, AMYLASE in the last 168 hours.  Recent Labs Lab 01/14/15 1115 01/15/15 0320  AMMONIA 41* 35   CBC:  Recent  Labs Lab 01/08/15 1519 01/08/15 2200 01/09/15 0351  WBC 6.8 5.6 5.5  HGB 12.0* 10.9* 10.9*  HCT 36.8* 34.0* 34.1*  MCV 87.8 87.9 89.3  PLT 189 148* 150   Cardiac Enzymes:  Recent Labs Lab 01/08/15 1519 01/08/15 2200 01/09/15 0351 01/13/15 1048  CKTOTAL  --   --   --  590*  TROPONINI 0.03 0.03 0.03  --    BNP: BNP (last 3 results)  Recent Labs  06/13/14 1038 01/08/15 1519 01/08/15 2200  BNP 740.2* 373.8* 538.7*    ProBNP (last 3 results)  Recent Labs  08/12/14 1254 12/03/14 1557  PROBNP 400.0* 375.0*    CBG:  Recent Labs Lab 01/12/15 0401 01/12/15 0551  GLUCAP 179* 111*       Signed:  Ceana Fiala A  [facility] 01/15/2015, 10:51 AM

## 2015-01-15 NOTE — Progress Notes (Addendum)
CSW (Clinical Education officer, museum) left voicemail for facility to notify of plan for dc. Pt daughter made aware and confirmed pt will need non-emergent ambulance transport.   ADDENDUM: CSW received call back from facility. They confirmed they are ready for pt.   CSW (Clinical Education officer, museum) prepared pt dc packet and placed with shadow chart. CSW arranged non-emergent ambulance transport. Pt family, pt nurse, and facility informed. CSW signing off.   [place], [place]

## 2015-01-15 NOTE — Progress Notes (Signed)
Pt transferred out via stretcher by PTAR awake, alert and conversant. Daughter Thomas [name] made aware of time of departure from Hospital. Report called tro Deirdre Priest, LPN receiving nurse at [facility] in [place]. All questions answered.

## 2015-01-15 NOTE — Care Management Important Message (Signed)
Important Message  Patient Details  Name: Thomas [name] MRN: [id number] Date of Birth: [birth year]   Medicare Important Message Given:  Yes-second notification given    Pricilla Handler 01/15/2015, 12:39 PM

## 2015-01-15 NOTE — Progress Notes (Signed)
Subjective: No overnight events. Mental status appears to have improved, no further episodes of generalized shaking noted.   Objective: Current vital signs: BP 93/54 mmHg  Pulse 61  Temp(Src) 98 F (36.7 C) (Oral)  Resp 18  Ht 6' (1.829 m)  Wt 79.2 kg (174 lb 9.7 oz)  BMI 23.68 kg/m2  SpO2 93% Vital signs in last 24 hours: Temp:  [97.7 F (36.5 C)-98.2 F (36.8 C)] 98 F (36.7 C) (08/04 0552) Pulse Rate:  [59-61] 61 (08/04 0552) Resp:  [17-18] 18 (08/04 0552) BP: (81-124)/(26-62) 93/54 mmHg (08/04 0552) SpO2:  [93 %-99 %] 93 % (08/04 0552) Weight:  [79.2 kg (174 lb 9.7 oz)] 79.2 kg (174 lb 9.7 oz) (08/04 0552)  Intake/Output from previous day: 08/03 0701 - 08/04 0700 In: 780 [P.O.:780] Out: 225 [Urine:225] Intake/Output this shift:   Nutritional status: DIET DYS 3 Room service appropriate?: Yes; Fluid consistency:: Nectar Thick  Neurologic Exam: General: Mental Status: Alert, oriented to name, hospital  Speech dysarthric but more sensical.  Will follow simple commands Cranial Nerves: II: blinks to threat bilaterally, pupils equal, round, reactive to light and accommodation III,IV, VI: ptosis not present, extra-ocular motions intact bilaterally V,VII: face symmetric Motor: Right : Upper extremity   5/5    Left:     Upper extremity   5/5  Lower extremity   5/5     Lower extremity   5/5 Tone and bulk:normal tone throughout; no atrophy noted Sensory: Pinprick and light touch intact throughout, bilaterally  Lab Results: Basic Metabolic Panel:  Recent Labs Lab 01/10/15 0300 01/11/15 0255 01/12/15 0531 01/13/15 0432 01/15/15 0320  NA 140 140 142 142 145  K 3.6 3.8 3.8 3.6 3.3*  CL 103 106 107 104 107  CO2 29 26 25 25 27   GLUCOSE 87 107* 95 79 117*  BUN 25* 23* 24* 31* 38*  CREATININE 1.64* 1.55* 1.81* 1.78* 1.68*  CALCIUM 8.8* 9.0 9.2 9.3 9.2  MG  --   --  2.2 2.3  --   PHOS  --   --  3.3  --   --     Liver Function Tests:  Recent Labs Lab  01/09/15 0351  AST 21  ALT 24  ALKPHOS 74  BILITOT 1.1  PROT 6.1*  ALBUMIN 3.2*   No results for input(s): LIPASE, AMYLASE in the last 168 hours.  Recent Labs Lab 01/14/15 1115 01/15/15 0320  AMMONIA 41* 35    CBC:  Recent Labs Lab 01/08/15 1519 01/08/15 2200 01/09/15 0351  WBC 6.8 5.6 5.5  HGB 12.0* 10.9* 10.9*  HCT 36.8* 34.0* 34.1*  MCV 87.8 87.9 89.3  PLT 189 148* 150    Cardiac Enzymes:  Recent Labs Lab 01/08/15 1519 01/08/15 2200 01/09/15 0351 01/13/15 1048  CKTOTAL  --   --   --  590*  TROPONINI 0.03 0.03 0.03  --     Lipid Panel: No results for input(s): CHOL, TRIG, HDL, CHOLHDL, VLDL, LDLCALC in the last 168 hours.  CBG:  Recent Labs Lab 01/12/15 0401 01/12/15 0551  GLUCAP 179* 111*    Microbiology: Results for orders placed or performed during the hospital encounter of 01/08/15  Culture, Urine     Status: None   Collection Time: 01/12/15  4:10 AM  Result Value Ref Range Status   Specimen Description URINE, RANDOM  Final   Special Requests ADDED [id number] [phone]  Final   Culture MULTIPLE SPECIES PRESENT, SUGGEST RECOLLECTION  Final   Report Status 01/13/2015  FINAL  Final    Coagulation Studies: No results for input(s): LABPROT, INR in the last 72 hours.  Imaging: No results found.  Medications:  Scheduled: . amantadine  100 mg Oral BID  . amiodarone  200 mg Oral Daily  . aspirin EC  81 mg Oral Daily  . carbidopa-levodopa  1 tablet Oral TID  . clopidogrel  75 mg Oral Daily  . DULoxetine  30 mg Oral QODAY  . furosemide  20 mg Oral Daily  . lactulose  20 g Oral BID  . metoprolol succinate  12.5 mg Oral Daily  . mupirocin ointment   Topical Daily  . pantoprazole  40 mg Oral Daily  . pramipexole  0.25 mg Oral TID  . rotigotine  1 patch Transdermal Daily  . sodium chloride  3 mL Intravenous Q12H  . sulfamethoxazole-trimethoprim  1 tablet Oral Q12H    Assessment/Plan:   81y/o hx of CHF, PD presenting with increased dyspnea  related to CHF found to have recurrent episodes of generalized tremors without loss of consciousness or loss of bowel/bladder. Episodes also associated with delerium.  Unclear etiology of these continued episodes and associated delerium. Levaquin has been shown to cause encephalopathy associated with myoclonus, question if this is the etiology. Will discontinue levaquin and switch to bactrim.Question if some of his PD medications are playing a role in his delirium. Dual dopamine agonist therapy can potentially exacerbate delerium. Amantadine has also been implicated. Showed marked improvement with addition of Sinemet, question if he may have been undermedicated.   -will start low dose Sinemet 25-100 TID -will cut mirapex in half to 0.25mg  TID. Would continue this dose for now. Would follow up with his  outpatient neurologist to further taper this off.  -dc levaquin and start bactrim for UTI -no further neurological workup at this time, please call with further questions   Jim Like, DO [facility] [phone]  If 7pm- 7am, please page neurology on call as listed in [place].  Etta Quill PA-C [facility] [phone]  01/15/2015, 9:27 AM

## 2015-01-16 NOTE — Clinical Social Work Placement (Signed)
   CLINICAL SOCIAL WORK PLACEMENT  NOTE  Date:  01/16/2015  Patient Details  Name: Thomas [name] MRN: [id number] Date of Birth: 10-Jan-1934  Clinical Social Work is seeking post-discharge placement for this patient at the [place] level of care (*CSW will initial, date and re-position this form in  chart as items are completed):  Yes   Patient/family provided with [facility] Work Department's list of facilities offering this level of care within the geographic area requested by the patient (or if unable, by the patient's family).  Yes   Patient/family informed of their freedom to choose among providers that offer the needed level of care, that participate in Medicare, Medicaid or managed care program needed by the patient, have an available bed and are willing to accept the patient.  Yes   Patient/family informed of [facility]'s ownership interest in [facility] and [facility], as well as of the fact that they are under no obligation to receive care at these facilities.  PASRR submitted to EDS on  (PASARR not required for [place])     PASRR number received on       Existing PASRR number confirmed on       FL2 transmitted to all facilities in geographic area requested by pt/family on 01/10/15     FL2 transmitted to all facilities within larger geographic area on       Patient informed that his/her managed care company has contracts with or will negotiate with certain facilities, including the following:        Yes   Patient/family informed of bed offers received.  Patient chooses bed at [facility]     Physician recommends and patient chooses bed at      Patient to be transferred to [facility] on 01/15/15.  Patient to be transferred to facility by Ambulance Corey Harold)     Patient family notified on 01/15/15 of transfer.  Name of family member notified:  Daughter Vito Backers     PHYSICIAN Please sign  FL2, Please prepare priority discharge summary, including medications, Please prepare prescriptions     Additional Comment: Ok per MD for d/c 01/15/15 to SNF for continued care. DC facilitated by Poonum Ambelal, LCSWA- SW coverage.  Daughter was aware of d/c as this was her preference for placement.  Patient remained alert but confused.  Nursing called report to facility. No further CSW needs identified and CSW services signed off.  Lorie Phenix. Murrell Redden [phone]      _______________________________________________ Williemae Area, LCSW 01/16/2015, 7:13 AM

## 2015-01-25 NOTE — Progress Notes (Signed)
Cardiology Office Note   Date:  01/26/2015   ID:  Thomas [name], DOB 1934-02-14, MRN [id number]  PCP:  Madaline Brilliant, MD  Cardiologist:  Sinclair Grooms, MD   Chief Complaint  Patient presents with  . Congestive Heart Failure      History of Present Illness: Thomas [name] is a 81 y.o. male who presents for follow-up of congestive cardiomyopathy, coronary artery disease, paroxysmal atrial fibrillation, amiodarone therapy, elevated thyroid with suppressed TSH, and Parkinsonism with progressive debility.  He is now in a skilled nursing facility but wants to get out of there and go home. He feels improved. He is stronger. States that he will be in the nursing home only as long as it takes him to get strong enough to walk. His appetite is improved. Lower extremity swelling has completely resolved.  There was a question of thyrotoxicosis while in the hospital. He is on amiodarone and that may have some influence.  Past Medical History  Diagnosis Date  . Coronary artery disease     80% distal RCA stenosis.  . Thyroid disease     HYPOTHYROIDISM  . Nontoxic multinodular goiter   . Psychosexual dysfunction with inhibited sexual excitement     ERECTILE DYSFUNCTION  . Hypertrophy of prostate without urinary obstruction and other lower urinary tract symptoms (LUTS)     BPH  . Hypertension   . Wide-complex tachycardia   . Parkinson's disease   . Status post biventricular cardiac pacemaker insertion     Status post CRT-D.    Past Surgical History  Procedure Laterality Date  . Cardiac catheterization    . Cardioversion N/A 06/03/2014    Procedure: CARDIOVERSION;  Surgeon: Thompson Grayer, MD;  Location[facility] CATH LAB;  Service: Cardiovascular;  Laterality: N/A;  . Left heart catheterization with coronary angiogram N/A 06/10/2014    Procedure: LEFT HEART CATHETERIZATION WITH CORONARY ANGIOGRAM;  Surgeon: Sinclair Grooms, MD;  Location: [facility] CATH LAB;  Service: Cardiovascular;   Laterality: N/A;  . Abdominal angiogram  06/10/2014    Procedure: ABDOMINAL ANGIOGRAM;  Surgeon: Sinclair Grooms, MD;  Location: [facility] CATH LAB;  Service: Cardiovascular;;  . Percutaneous coronary stent intervention (pci-s) N/A 06/12/2014    Procedure: PERCUTANEOUS CORONARY STENT INTERVENTION (PCI-S);  Surgeon: Sinclair Grooms, MD;  Location: [facility] CATH LAB;  Service: Cardiovascular;  Laterality: N/A;  . Percutaneous coronary rotoblator intervention (pci-r)  06/12/2014    Procedure: PERCUTANEOUS CORONARY ROTOBLATOR INTERVENTION (PCI-R);  Surgeon: Sinclair Grooms, MD;  Location: [facility] CATH LAB;  Service: Cardiovascular;;  . Cardiac catheterization  06/12/2014    Procedure: CORONARY STENT INTERVENTION W/IMPELLA;  Surgeon: Sinclair Grooms, MD;  Location: [facility] CATH LAB;  Service: Cardiovascular;;     Current Outpatient Prescriptions  Medication Sig Dispense Refill  . acetaminophen (TYLENOL) 325 MG tablet Take 650 mg by mouth every 4 (four) hours as needed for mild pain.    Marland Kitchen amantadine (SYMMETREL) 100 MG capsule Take 100 mg by mouth 2 (two) times daily.     . Amino Acids-Protein Hydrolys (FEEDING SUPPLEMENT, PRO-STAT SUGAR FREE 64,) LIQD Take 30 mLs by mouth 2 (two) times daily.    Marland Kitchen amiodarone (PACERONE) 200 MG tablet Take 1 tablet (200 mg total) by mouth daily. 30 tablet 6  . aspirin EC 81 MG EC tablet Take 1 tablet (81 mg total) by mouth daily.    . carbidopa-levodopa (SINEMET IR) 25-100 MG per tablet Take 1 tablet by mouth 3 (three) times  daily. 30 tablet 0  . clopidogrel (PLAVIX) 75 MG tablet Take 1 tablet (75 mg total) by mouth daily. 30 tablet 11  . furosemide (LASIX) 20 MG tablet Take 1 tablet (20 mg total) by mouth daily. 30 tablet 0  . lactulose (CHRONULAC) 10 GM/15ML solution Take 30 mLs (20 g total) by mouth daily as needed for mild constipation. 240 mL 0  . magnesium hydroxide (MILK OF MAGNESIA) 400 MG/5ML suspension Take 30 mLs by mouth at bedtime as needed for mild constipation.    .  methimazole (TAPAZOLE) 10 MG tablet Take 1 tablet (10 mg total) by mouth daily. 30 tablet 0  . metoprolol succinate (TOPROL-XL) 12.5 mg TB24 24 hr tablet Take 0.5 tablets (12.5 mg total) by mouth daily. 30 tablet 5  . Multiple Vitamins-Minerals (ICAPS MV PO) Take 2 by mouth twice daily     . nitroGLYCERIN (NITROSTAT) 0.4 MG SL tablet Place 0.4 mg under the tongue every 5 (five) minutes as needed for chest pain.     . Omega-3 Fatty Acids (FISH OIL) 1000 MG CAPS Take one by mouth twice daily      . omeprazole (PRILOSEC) 20 MG capsule Take 1 capsule (20 mg total) by mouth daily. 30 capsule 11  . pramipexole (MIRAPEX) 0.25 MG tablet Take 1 tablet (0.25 mg total) by mouth 3 (three) times daily. 60 tablet 0  . rotigotine (NEUPRO) 6 MG/24HR Place 1 patch onto the skin daily.    . vitamin C (ASCORBIC ACID) 500 MG tablet Take 500 mg by mouth daily.      No current facility-administered medications for this visit.    Allergies:   Ciprofloxacin and Levaquin    Social History:  The patient  reports that he quit smoking about 19 years ago. His smoking use included Cigarettes. He has a 45 pack-year smoking history. He has never used smokeless tobacco. He reports that he does not drink alcohol or use illicit drugs.   Family History:  The patient's family history includes Coronary artery disease in his other; Heart attack in his mother. There is no history of Thyroid disease.    ROS:  Please see the history of present illness.   Otherwise, review of systems are positive for stubborn according to his daughter. He doesn't want to be in the skilled nursing facility. His strength has improved..   All other systems are reviewed and negative.    PHYSICAL EXAM: VS:  BP 90/50 mmHg  Pulse 60  Ht 6' (1.829 m)  Wt 84.877 kg (187 lb 1.9 oz)  BMI 25.37 kg/m2  SpO2 95% , BMI Body mass index is 25.37 kg/(m^2). recheck of blood pressure 99/56 mmHg. GEN: Well nourished, well developed, in no acute distress HEENT:  normal Neck: no JVD, carotid bruits, or masses Cardiac: RRR; no murmurs, rubs, or gallops,no edema  Respiratory:  clear to auscultation bilaterally, normal work of breathing GI: soft, nontender, nondistended, + BS MS: no deformity or atrophy Skin: warm and dry, no rash Neuro:  Strength and sensation are intact Psych: euthymic mood, full affect   EKG:  EKG is not ordered today.   Recent Labs: 12/03/2014: Pro B Natriuretic peptide (BNP) 375.0* 01/08/2015: B Natriuretic Peptide 538.7* 01/09/2015: ALT 24; Hemoglobin 10.9*; Platelets 150 01/13/2015: Magnesium 2.3 01/14/2015: TSH 0.117* 01/15/2015: BUN 38*; Creatinine, Ser 1.68*; Potassium 3.3*; Sodium 145    Lipid Panel No results found for: CHOL, TRIG, HDL, CHOLHDL, VLDL, LDLCALC, LDLDIRECT    Wt Readings from Last 3 Encounters:  01/26/15 84.877 kg (187 lb 1.9 oz)  01/15/15 79.2 kg (174 lb 9.7 oz)  12/03/14 83.825 kg (184 lb 12.8 oz)      Other studies Reviewed: Additional studies/ records that were reviewed today include: Reviewed the discharge summary and hospital records from the late July early August admission..    ASSESSMENT AND PLAN:  1. Non-ischemic cardiomyopathy-EF 25-30% No evidence of volume overload  2. On amiodarone therapy May be causing thyrotoxicosis. Therefore we will reassess  3. Coronary artery disease involving native coronary artery of native heart without angina pectoris Denies angina  4. Thyrotoxicosis with toxic multinodular goiter and without thyroid storm Rule out thyroid abnormalities related to amiodarone  5. Essential hypertension Controlled and relatively low. Rule out by M contraction    Current medicines are reviewed at length with the patient today.  The patient has concerns regarding medicines.  The following changes have been made:  Continue current medical therapy. Adjustments in diuretic and amiodarone therapy may be necessary if laboratory data suggests.  Labs/ tests ordered  today include:  No orders of the defined types were placed in this encounter.     Disposition:   FU with HS in 5 months  Signed, Sinclair Grooms, MD  01/26/2015 3:25 PM    [facility] [address], [place], [place]  [address] Phone: [phone]; Fax: [phone]

## 2015-01-26 ENCOUNTER — Encounter: Payer: Self-pay | Admitting: Interventional Cardiology

## 2015-01-26 ENCOUNTER — Ambulatory Visit (INDEPENDENT_AMBULATORY_CARE_PROVIDER_SITE_OTHER): Payer: Medicare Other | Admitting: Interventional Cardiology

## 2015-01-26 VITALS — BP 90/50 | HR 60 | Ht 72.0 in | Wt 187.1 lb

## 2015-01-26 DIAGNOSIS — I5022 Chronic systolic (congestive) heart failure: Secondary | ICD-10-CM

## 2015-01-26 DIAGNOSIS — E0520 Thyrotoxicosis with toxic multinodular goiter without thyrotoxic crisis or storm: Secondary | ICD-10-CM

## 2015-01-26 DIAGNOSIS — Z79899 Other long term (current) drug therapy: Secondary | ICD-10-CM | POA: Diagnosis not present

## 2015-01-26 DIAGNOSIS — I10 Essential (primary) hypertension: Secondary | ICD-10-CM

## 2015-01-26 DIAGNOSIS — I429 Cardiomyopathy, unspecified: Secondary | ICD-10-CM

## 2015-01-26 DIAGNOSIS — I428 Other cardiomyopathies: Secondary | ICD-10-CM

## 2015-01-26 DIAGNOSIS — I2510 Atherosclerotic heart disease of native coronary artery without angina pectoris: Secondary | ICD-10-CM

## 2015-01-26 LAB — BASIC METABOLIC PANEL
BUN: 27 mg/dL — AB (ref 6–23)
CHLORIDE: 103 meq/L (ref 96–112)
CO2: 31 meq/L (ref 19–32)
CREATININE: 1.3 mg/dL (ref 0.40–1.50)
Calcium: 9.2 mg/dL (ref 8.4–10.5)
GFR: 56.57 mL/min — ABNORMAL LOW (ref >60.00)
GLUCOSE: 93 mg/dL (ref 70–99)
POTASSIUM: 4.2 meq/L (ref 3.5–5.1)
Sodium: 140 mEq/L (ref 135–145)

## 2015-01-26 LAB — T4, FREE: Free T4: 2.6 ng/dL — ABNORMAL HIGH (ref 0.60–1.60)

## 2015-01-26 LAB — TSH: TSH: 0.06 u[IU]/mL — ABNORMAL LOW (ref 0.35–4.50)

## 2015-01-26 NOTE — Patient Instructions (Signed)
Medication Instructions:  Your physician recommends that you continue on your current medications as directed. Please refer to the Current Medication list given to you today.   Labwork: Tsh, T4, Bmet today   Testing/Procedures: None   Follow-Up: Your physician recommends that you schedule a follow-up appointment in: 3-4 months with Dr.Smith   Any Other Special Instructions Will Be Listed Below (If Applicable).

## 2015-01-28 ENCOUNTER — Telehealth: Payer: Self-pay

## 2015-01-28 ENCOUNTER — Telehealth: Payer: Self-pay | Admitting: Interventional Cardiology

## 2015-01-28 NOTE — Telephone Encounter (Signed)
Pt daughter aware of lab results and Dr.Smith instructions. Please see tel enc with today's date

## 2015-01-28 NOTE — Telephone Encounter (Signed)
-----   Message from Belva Crome, MD sent at 01/26/2015  8:02 PM EDT ----- Thyroid overactive and likely related to amiodarone. Will stop the medication. Atrial fibrillation may recur.  Will need to let the primary physician know that amiodarone stopped so that Methimazole can be adjusted.  Kidney function is stable and so diuretic dose is correct.

## 2015-01-28 NOTE — Telephone Encounter (Signed)
New message      Returning Lisa's call

## 2015-01-28 NOTE — Telephone Encounter (Signed)
lmom for pt daughter [name] to call back.  Called pt SNF [facility] Therapy phone [phone] and spoke with the nurse that takes care of him Kim,RN. Lab results and verbal instructions given. Written instructions faxed to [phone]  Per Dr.Smith:  Thyroid overactive and likely related to amiodarone. Will stop the medication. Atrial fibrillation may recur. Will need to let the primary physician know that amiodarone stopped so that Methimazole can be adjusted.      Kidney function is stable and so diuretic dose is correct.    they will make the onsite physician aware, for futher adjustment of pt Methimazole

## 2015-03-05 ENCOUNTER — Encounter: Payer: Self-pay | Admitting: Internal Medicine

## 2015-03-05 ENCOUNTER — Ambulatory Visit (INDEPENDENT_AMBULATORY_CARE_PROVIDER_SITE_OTHER): Payer: Medicare Other | Admitting: Internal Medicine

## 2015-03-05 ENCOUNTER — Other Ambulatory Visit (INDEPENDENT_AMBULATORY_CARE_PROVIDER_SITE_OTHER): Payer: Medicare Other

## 2015-03-05 ENCOUNTER — Ambulatory Visit (INDEPENDENT_AMBULATORY_CARE_PROVIDER_SITE_OTHER)
Admission: RE | Admit: 2015-03-05 | Discharge: 2015-03-05 | Disposition: A | Discharge status: Home / Self Care | Payer: Medicare Other | Source: Ambulatory Visit | Attending: Internal Medicine | Admitting: Internal Medicine

## 2015-03-05 VITALS — BP 90/58 | HR 60 | Ht 72.0 in | Wt 172.0 lb

## 2015-03-05 DIAGNOSIS — R0600 Dyspnea, unspecified: Secondary | ICD-10-CM | POA: Insufficient documentation

## 2015-03-05 DIAGNOSIS — R0689 Other abnormalities of breathing: Secondary | ICD-10-CM | POA: Diagnosis not present

## 2015-03-05 DIAGNOSIS — I2510 Atherosclerotic heart disease of native coronary artery without angina pectoris: Secondary | ICD-10-CM

## 2015-03-05 DIAGNOSIS — E0590 Thyrotoxicosis, unspecified without thyrotoxic crisis or storm: Secondary | ICD-10-CM | POA: Diagnosis not present

## 2015-03-05 LAB — CBC WITH DIFFERENTIAL/PLATELET
BASOS ABS: 0 10*3/uL (ref 0.0–0.1)
Basophils Relative: 0.5 % (ref 0.0–3.0)
EOS PCT: 3.6 % (ref 0.0–5.0)
Eosinophils Absolute: 0.3 10*3/uL (ref 0.0–0.7)
HEMATOCRIT: 36.6 % — AB (ref 39.0–52.0)
Hemoglobin: 11.9 g/dL — ABNORMAL LOW (ref 13.0–17.0)
LYMPHS PCT: 10.1 % — AB (ref 12.0–46.0)
Lymphs Abs: 0.8 10*3/uL (ref 0.7–4.0)
MCHC: 32.5 g/dL (ref 30.0–36.0)
MCV: 81.2 fl (ref 78.0–100.0)
MONOS PCT: 7.9 % (ref 3.0–12.0)
Monocytes Absolute: 0.6 10*3/uL (ref 0.1–1.0)
NEUTROS ABS: 5.8 10*3/uL (ref 1.4–7.7)
Neutrophils Relative %: 77.9 % — ABNORMAL HIGH (ref 43.0–77.0)
PLATELETS: 141 10*3/uL — AB (ref 150.0–400.0)
RBC: 4.51 Mil/uL (ref 4.22–5.81)
RDW: 15.2 % (ref 11.5–15.5)
WBC: 7.5 10*3/uL (ref 4.0–10.5)

## 2015-03-05 LAB — T3 UPTAKE: T3 Uptake: 40 % — ABNORMAL HIGH (ref 22–35)

## 2015-03-05 LAB — T4: T4, Total: 12.1 ug/dL — ABNORMAL HIGH (ref 4.5–12.0)

## 2015-03-05 LAB — SEDIMENTATION RATE: Sed Rate: 15 mm/hr (ref 0–22)

## 2015-03-05 NOTE — Patient Instructions (Signed)
Continue to wear the 02 at bedtime  I strongly suspect your thyroid is what is making you short of breath and will communicate with Dr Alfonso Ramus on this

## 2015-03-05 NOTE — Assessment & Plan Note (Addendum)
T4 03/05/2015 12.1 and T3Uptake 40% with TSH < .0008   Clearly still hyperthyroid though no certainly evidence of storm on rx with methimazole and pulse only 60; however,   has not yet seen Dr Dwyane Dee, strongly rec he do so .

## 2015-03-05 NOTE — Progress Notes (Signed)
Subjective:    Patient ID: Thomas [name], male    DOB: [birth year], 81 y.o.   MRN: [id number]  HPI   43 yowm quit smoking 1997 with Parkinson's Dz x 2011 with new sob early in 2016 > hosp> dx heart problems > no better then back in p blood pressure drop and rapid heart beat and referred by Dr Tommye Standard to pulmonary clinic 03/05/2015   Admit date: 01/08/2015 Discharge date: 01/15/2015  Recommendations for Outpatient Follow-up:  1. Needs B-met to follow K level and renal function.  2. Needs to follow with cardiology for evaluation and titration of amiodarone.  3. Needs to follow up with endocrinologist Dr Dwyane Dee >> did not happen 4. Follow up with neurology for further titration off mirapex.  Discharge Diagnoses:   Non-ischemic cardiomyopathy-EF 25-30%  Parkinson disease  Worsening renal function  Altered awareness, transient  Pressure ulcer  Shortness of breath   Essential hypertension  CAD-single vessel (RCA) in 2012  CAD (coronary artery disease), native coronary artery     Diet recommendation: Dysphagia 3 diet, nectar thick  Filed Weights   01/11/15 0247 01/13/15 0558 01/15/15 0552  Weight: 83.5 kg (184 lb 1.4 oz) 78.9 kg (173 lb 15.1 oz) 79.2 kg (174 lb 9.7 oz)   Hospital Course:  81 y.o. male with a past medical history of hypertension, CAD, status post biventricular cardiac pacemaker insertion, hypothyroidism, BPH, Parkinson's disease who comes to the ER with a history of progressively worse dyspnea for about a week, particularly on exertion. This is associated with worsening edema of the lower extremities, decreased appetite, fatigue and some trouble sleeping. Hospitalization complicated with worsening tremors and UTI. See below for details. Patient is weak and will need SNF at discharge  Assessment/Plan: 1-acute on chronic systolic heart failure: EF 25-30% -SOB and swelling significantly improved; will switch to PO lasix. Initially Plan was to  discharge on lasix 30 mg daily; but given slight increase in Cr, will continue lasix 8m daily. (discussed with Dr. MBenjamine MolaHeart failure service) -continue strict I's and O's -follow low sodium diet -continue toprol -no ACE/ARB due to chronic renal failure  2- a. Fib: continue pacerone -CHADsVASC score 4 -on ASA and plavix -high risk for falling due to parkinson's disease and no good candidate for anticoagulation  3-hx of CAD: no CP -troponin neg -continue ASA, plavix, and b-blockers  4-HLD: will hold on statins; given ongoing worsening tremors and concerns for increase chances of rhabdo -follow CK  5-Delirium, tremors.  Ammonia at 40, received lactulose. Ammonia normal. TSH low . free T 3 normal and T 4 high. Suspect related to amiodarone. Discussed with Endocrinologist Dr KDwyane Dee Will start low dose methimazole. Needs to follow up with endocrinologist in 2 to 3 weeks. .  hx of parkinson's:  -will continue mirapex, neupro and amantadine , neurology adjusting doses .  -needs physical reconditioning  -neurology called, due to worsening on tremors. EEG neg -most likely infection playing a role and change in time for his home meds. -started on sinemt, pramipexole decreased. This medication will need to be taper off. Needs to follow up with neurologist.   6-depression: continue cymbalta (since 7/25 according to family they initiated process of weaning off cymbalta and patient is taking reduce dose)  7-GERD; continue PPI  8-RLE swelling/redness: no DVT and no true signs of infection appreciated -improved/resolved with diuresis -no abx's needed for this problem -due to swelling/stasis   9-CKD stage III: stable and in fact better than at baseline  after diuresis -will follow BMET -Cr 1.78 on 8/2  10-physical deconditioning: Will need SNF. -SW consulted  11-UTI:  -culture with multiple microorganism; non-specific isolation -denies dysuria and currently afebrile  Levaquin  change to bactrim, due to concern for Levaquin causing delirium.  Discharge on bactrim .          03/05/2015 1st [facility] office visit/ [facility]  Re sob  Chief Complaint  Patient presents with  . Pulmonary Consult    Referred by Dr. Tommye Standard. Pt c/o non prod cough and DOE x 6 months. He states that he gets SOB walking from room to room.      Ok sitting/ ok walking with walker/ sob supine better lying  on R side newly rx with 02 x 2 weeks at bedtime helps  Not much cough at all nor sign sputum production  No obvious other patterns in day to day or daytime variabilty or assoc  cp or chest tightness, subjective wheeze overt sinus or hb symptoms. No unusual exp hx or h/o childhood pna/ asthma or knowledge of premature birth.  Sleeping ok on R side on 02  without nocturnal  or early am exacerbation  of respiratory  c/o's or need for noct saba. Also denies any obvious fluctuation of symptoms with weather or environmental changes or other aggravating or alleviating factors except as outlined above   Current Medications, Allergies, Complete Past Medical History, Past Surgical History, Family History, and Social History were reviewed in [employer] record.            Review of Systems  Constitutional: Negative for fever, chills, activity change, appetite change and unexpected weight change.  HENT: Positive for congestion and postnasal drip. Negative for dental problem, rhinorrhea, sneezing, sore throat, trouble swallowing and voice change.   Eyes: Negative for visual disturbance.  Respiratory: Positive for cough and shortness of breath. Negative for choking.   Cardiovascular: Negative for chest pain and leg swelling.  Gastrointestinal: Negative for nausea, vomiting and abdominal pain.  Genitourinary: Negative for difficulty urinating.  Musculoskeletal: Positive for arthralgias.  Skin: Negative for rash.  Psychiatric/Behavioral: Negative for behavioral  problems and confusion.       Objective:   Physical Exam W/c bound hoarse wm nad   Wt Readings from Last 3 Encounters:  03/05/15 172 lb (78.019 kg)  01/26/15 187 lb 1.9 oz (84.877 kg)  01/15/15 174 lb 9.7 oz (79.2 kg)    Vital signs reviewed  HEENT: nl dentition, turbinates, and orophanx. Nl external ear canals without cough reflex   NECK :  without JVD/Nodes/TM/ nl carotid upstrokes bilaterally   LUNGS: no acc muscle use, minimal insp and exp rhonchi with slt decrease bs L Base    CV:  RRR  no s3 or murmur or increase in P2, no edema   ABD:  soft and nontender with nl excursion in the supine position. No bruits or organomegaly, bowel sounds nl  MS:  warm without deformities, calf tenderness, cyanosis or clubbing  SKIN: warm and dry without lesions    NEURO:  alert, approp, no deficits     I personally reviewed images and agree with radiology impression as follows:  CXR:  03/05/2015 1. AICD in stable position. Stable severe cardiomegaly. No pulmonary venous congestion. 2. Calcified pleural plaques again noted consistent with prior asbestos exposure. 3. Low lung volumes with mild right base subsegmental atelectasis  Vs 02/08/11 no change including elevation of L HD   Labs 02/27/15 TSH  02/27/15  < .0008 BMET  hco3  38 Creat 1.37  BNP 538  Lab sent 03/05/2015  :  T4, T3 uptake,  Lab Results  Component Value Date   WBC 7.5 03/05/2015   HGB 11.9* 03/05/2015   HCT 36.6* 03/05/2015   MCV 81.2 03/05/2015   PLT 141.0* 03/05/2015       Lab Results  Component Value Date   ESRSEDRATE 15 03/05/2015   ESRSEDRATE 4 08/12/2014          Assessment & Plan:

## 2015-03-05 NOTE — Assessment & Plan Note (Signed)
This is a surprising finding in pt with hyperthyroidism and chf as the main issues that I presume are driving his sob and suggest underlying copd in absence of obvious OHS - would use 02 carefully here, clearly not needed at rest or walking for now

## 2015-03-05 NOTE — Assessment & Plan Note (Addendum)
-  03/05/2015   Walked RA x 40 ft stopped due to legs weak, unsteady / slow pace, sats 99% at end and no sob    Symptoms are markedly disproportionate to objective findings and not clear this is a lung problem but pt does appear to have difficult airway management issues. DDX of  difficult airways management all start with A and  include Adherence, Ace Inhibitors, Acid Reflux, Active Sinus Disease, Alpha 1 Antitripsin deficiency, Anxiety masquerading as Airways dz,  ABPA,  allergy(esp in young), Aspiration (esp in elderly), Adverse effects of meds,  Active smokers, A bunch of PE's (a small clot burden can't cause this syndrome unless there is already severe underlying pulm or vascular dz with poor reserve) plus two Bs  = Bronchiectasis and Beta blocker use..and one C= CHF   Anemia and amiodarone exp need to be added to this ddx though he's not that anemic and ESR 15 rules against amio toxicity, at least the acute form.  A bunch of pe's excluded by V/q 01/08/15 but always a concern, doubt at present though   CHF has already been documented and clearly the main chronic problem he has > defer rx to Dr Daneen Schick, noting CRI complicates rx  Total time = 106m review case with pt/ discussion/ counseling/ giving and going over instructions (see avs)

## 2015-03-06 ENCOUNTER — Telehealth: Payer: Self-pay | Admitting: Internal Medicine

## 2015-03-06 NOTE — Progress Notes (Signed)
Quick Note:  Called and spoke with pt's daughter. Reviewed results and recs. Pt's daughter voiced understanding and had no further questions. ______

## 2015-03-06 NOTE — Progress Notes (Signed)
Quick Note:  LMTCB ______ 

## 2015-03-06 NOTE — Telephone Encounter (Signed)
Called and spoke to pt's daughter. Reviewed results and recs Pt's daughter voiced understanding and had no further questions Nothing further needed.

## 2015-06-02 ENCOUNTER — Encounter: Payer: Self-pay | Admitting: Interventional Cardiology

## 2015-06-02 ENCOUNTER — Ambulatory Visit (INDEPENDENT_AMBULATORY_CARE_PROVIDER_SITE_OTHER): Payer: Medicare Other | Admitting: Interventional Cardiology

## 2015-06-02 VITALS — BP 118/64 | HR 68 | Ht 72.0 in | Wt 192.8 lb

## 2015-06-02 DIAGNOSIS — I5022 Chronic systolic (congestive) heart failure: Secondary | ICD-10-CM

## 2015-06-02 DIAGNOSIS — I429 Cardiomyopathy, unspecified: Secondary | ICD-10-CM | POA: Diagnosis not present

## 2015-06-02 DIAGNOSIS — Z79899 Other long term (current) drug therapy: Secondary | ICD-10-CM | POA: Diagnosis not present

## 2015-06-02 DIAGNOSIS — I10 Essential (primary) hypertension: Secondary | ICD-10-CM

## 2015-06-02 DIAGNOSIS — I2510 Atherosclerotic heart disease of native coronary artery without angina pectoris: Secondary | ICD-10-CM | POA: Diagnosis not present

## 2015-06-02 DIAGNOSIS — I428 Other cardiomyopathies: Secondary | ICD-10-CM

## 2015-06-02 DIAGNOSIS — I447 Left bundle-branch block, unspecified: Secondary | ICD-10-CM

## 2015-06-02 DIAGNOSIS — I4891 Unspecified atrial fibrillation: Secondary | ICD-10-CM

## 2015-06-02 LAB — COMPREHENSIVE METABOLIC PANEL
ALBUMIN: 4 g/dL (ref 3.6–5.1)
ALT: 12 U/L (ref 9–46)
AST: 15 U/L (ref 10–35)
Alkaline Phosphatase: 90 U/L (ref 40–115)
BILIRUBIN TOTAL: 0.4 mg/dL (ref 0.2–1.2)
BUN: 26 mg/dL — ABNORMAL HIGH (ref 7–25)
CALCIUM: 9 mg/dL (ref 8.6–10.3)
CHLORIDE: 103 mmol/L (ref 98–110)
CO2: 28 mmol/L (ref 20–31)
Creat: 1.53 mg/dL — ABNORMAL HIGH (ref 0.70–1.18)
Glucose, Bld: 84 mg/dL (ref 65–99)
Potassium: 4.3 mmol/L (ref 3.5–5.3)
Sodium: 141 mmol/L (ref 135–146)
Total Protein: 6.3 g/dL (ref 6.1–8.1)

## 2015-06-02 NOTE — Patient Instructions (Addendum)
Medication Instructions:  Your physician recommends that you continue on your current medications as directed. Please refer to the Current Medication list given to you today.   Labwork: Cmet, Bnp, Urinalysis  Testing/Procedures: Your physician has requested that you have an echocardiogram. Echocardiography is a painless test that uses sound waves to create images of your heart. It provides your doctor with information about the size and shape of your heart and how well your heart's chambers and valves are working. This procedure takes approximately one hour. There are no restrictions for this procedure.    Follow-Up: Your physician wants you to follow-up in: 6 months with Dr.Smith You will receive a reminder letter in the mail two months in advance. If you don't receive a letter, please call our office to schedule the follow-up appointment.   Any Other Special Instructions Will Be Listed Below (If Applicable).     If you need a refill on your cardiac medications before your next appointment, please call your pharmacy.

## 2015-06-02 NOTE — Progress Notes (Signed)
Cardiology Office Note   Date:  06/02/2015   ID:  Thomas [name], DOB [birth year], MRN [id number]  PCP:  Thomas Standard, MD  Cardiologist:  Thomas Grooms, MD   Chief Complaint  [name] presents with  . Coronary Artery Disease      History of Present Illness: Thomas [name] is a 81 y.o. male who presents for follow-up of chronic systolic heart failure, coronary artery disease with prior CHIP LAD and circumflex within [place] support, history of atrial fibrillation, Parkinson's disease, and prior history of wide-complex tachycardia.  Thomas [name] is doing about as well as can be expected. He still has obvious parking since with bradykinesia and tremor. Since Thomas last office visit in August 2016, he has completed rehabilitation in a skilled nursing facility and is now back at home. He has not had chest discomfort. He has chronic dyspnea but markedly improved compared to a year ago this time. He denies orthopnea. He has recently gained weight up to 192 pounds from 187 and August. There is no orthopnea. He has not needed nitroglycerin. He has not had prolonged palpitations. He denies syncope. He ambulates with a walker.    Past Medical History  Diagnosis Date  . Coronary artery disease     80% distal RCA stenosis.  . Thyroid disease     HYPOTHYROIDISM  . Nontoxic multinodular goiter   . Psychosexual dysfunction with inhibited sexual excitement     ERECTILE DYSFUNCTION  . Hypertrophy of prostate without urinary obstruction and other lower urinary tract symptoms (LUTS)     BPH  . Hypertension   . Wide-complex tachycardia ([place])   . Parkinson's disease   . Status post biventricular cardiac pacemaker insertion     Status post CRT-D.    Past Surgical History  Procedure Laterality Date  . Cardiac catheterization    . Cardioversion N/A 06/03/2014    Procedure: CARDIOVERSION;  Surgeon: Thomas Grayer, MD;  Location: [facility] CATH LAB;  Service: Cardiovascular;  Laterality: N/A;  . Left heart  catheterization with coronary angiogram N/A 06/10/2014    Procedure: LEFT HEART CATHETERIZATION WITH CORONARY ANGIOGRAM;  Surgeon: Thomas Grooms, MD;  Location: [facility] CATH LAB;  Service: Cardiovascular;  Laterality: N/A;  . Abdominal angiogram  06/10/2014    Procedure: ABDOMINAL ANGIOGRAM;  Surgeon: Thomas Grooms, MD;  Location: [facility] CATH LAB;  Service: Cardiovascular;;  . Percutaneous coronary stent intervention (pci-s) N/A 06/12/2014    Procedure: PERCUTANEOUS CORONARY STENT INTERVENTION (PCI-S);  Surgeon: Thomas Grooms, MD;  Location: [facility] CATH LAB;  Service: Cardiovascular;  Laterality: N/A;  . Percutaneous coronary rotoblator intervention (pci-r)  06/12/2014    Procedure: PERCUTANEOUS CORONARY ROTOBLATOR INTERVENTION (PCI-R);  Surgeon: Thomas Grooms, MD;  Location: [facility] CATH LAB;  Service: Cardiovascular;;  . Cardiac catheterization  06/12/2014    Procedure: CORONARY STENT INTERVENTION W/IMPELLA;  Surgeon: Thomas Grooms, MD;  Location: [facility] CATH LAB;  Service: Cardiovascular;;     Current Outpatient Prescriptions  Medication Sig Dispense Refill  . acetaminophen (TYLENOL) 325 MG tablet Take 650 mg by mouth every 4 (four) hours as needed for mild pain.    Marland Kitchen amantadine (SYMMETREL) 100 MG capsule Take 100 mg by mouth 2 (two) times daily.     Marland Kitchen amLODipine (NORVASC) 5 MG tablet Take 5 mg by mouth daily as needed (elevated BP).    Marland Kitchen aspirin EC 81 MG EC tablet Take 1 tablet (81 mg total) by mouth daily.    Marland Kitchen b  complex vitamins tablet Take 1 tablet by mouth daily. Reported on 06/02/2015    . carbidopa-levodopa (SINEMET IR) 25-100 MG per tablet Take 1 tablet by mouth 3 (three) times daily. 30 tablet 0  . clopidogrel (PLAVIX) 75 MG tablet Take 75 mg by mouth daily.    Marland Kitchen escitalopram (LEXAPRO) 10 MG tablet Take 10 mg by mouth daily.    . furosemide (LASIX) 40 MG tablet Take 40 mg by mouth 2 (two) times daily.     Marland Kitchen ipratropium-albuterol (DUONEB) 0.5-2.5 (3) MG/3ML SOLN Take 3 mLs by  nebulization 4 (four) times daily.    Marland Kitchen lactulose (CHRONULAC) 10 GM/15ML solution Take 30 mLs (20 g total) by mouth daily as needed for mild constipation. 240 mL 0  . lisinopril (PRINIVIL,ZESTRIL) 2.5 MG tablet Take 2.5 mg by mouth daily.    . magnesium hydroxide (MILK OF MAGNESIA) 400 MG/5ML suspension Take 30 mLs by mouth at bedtime as needed for mild constipation.    . methimazole (TAPAZOLE) 10 MG tablet Take 1 tablet (10 mg total) by mouth daily. 30 tablet 0  . Multiple Vitamins-Minerals (ICAPS MV PO) Take 2 by mouth twice daily     . nitroGLYCERIN (NITROSTAT) 0.4 MG SL tablet Place 0.4 mg under Thomas tongue every 5 (five) minutes as needed for chest pain.     . Omega-3 Fatty Acids (FISH OIL) 1000 MG CAPS Take one by mouth twice daily      . omeprazole (PRILOSEC) 20 MG capsule Take 1 capsule (20 mg total) by mouth daily. 30 capsule 11  . potassium chloride SA (K-DUR,KLOR-CON) 20 MEQ tablet Take 20 mEq by mouth daily.    . pramipexole (MIRAPEX) 0.25 MG tablet Take 0.25 mg by mouth at bedtime.    . rotigotine (NEUPRO) 6 MG/24HR Place 1 patch onto Thomas skin daily.     No current facility-administered medications for this visit.    Allergies:   Ciprofloxacin and Levaquin    Social History:  Thomas [name]  reports that he quit smoking about 19 years ago. His smoking use included Cigarettes. He has a 45 pack-year smoking history. He has never used smokeless tobacco. He reports that he does not drink alcohol or use illicit drugs.   Family History:  Thomas [name]'s family history includes Coronary artery disease in his other; Heart attack in his mother. There is no history of Thyroid disease.    ROS:  Please see Thomas history of present illness.   Otherwise, review of systems are positive for daughter is concerned about Thomas possibility of a smoldering UTI. He has trace lower extremity swelling. Otherwise no complaints..   All other systems are reviewed and negative.    PHYSICAL EXAM: VS:  BP 118/64  mmHg  Pulse 68  Ht 6' (1.829 m)  Wt 192 lb 12.8 oz (87.454 kg)  BMI 26.14 kg/m2  SpO2 98% , BMI Body mass index is 26.14 kg/(m^2). GEN: Well nourished, well developed, in no acute distress HEENT: normal Neck: no JVD, carotid bruits, or masses Cardiac: RRR.  There is no murmur, rub, or gallop. There is bilateral 1+ ankle edema. Respiratory:  clear to auscultation bilaterally, normal work of breathing. GI: soft, nontender, nondistended, + BS MS: no deformity or atrophy Skin: warm and dry, no rash Neuro:  Strength and sensation are intact Psych: euthymic mood, full affect   EKG:  EKG is not ordered today.    Recent Labs: 12/03/2014: Pro B Natriuretic peptide (BNP) 375.0* 01/08/2015: B Natriuretic Peptide 538.7* 01/09/2015:  ALT 24 01/13/2015: Magnesium 2.3 01/26/2015: BUN 27*; Creatinine, Ser 1.30; Potassium 4.2; Sodium 140; TSH 0.06* 03/05/2015: Hemoglobin 11.9*; Platelets 141.0*    Lipid Panel No results found for: CHOL, TRIG, HDL, CHOLHDL, VLDL, LDLCALC, LDLDIRECT    Wt Readings from Last 3 Encounters:  06/02/15 192 lb 12.8 oz (87.454 kg)  03/05/15 172 lb (78.019 kg)  01/26/15 187 lb 1.9 oz (84.877 kg)      Other studies Reviewed: Additional studies/ records that were reviewed today include: Medication list has significantly changed since Thomas last office visit.. Thomas findings include amiodarone is been discontinued. Furosemide is now 40 mg twice daily..    ASSESSMENT AND PLAN:  1. Coronary artery disease involving native coronary artery of native heart without angina pectoris Had protected LAD and circumflex PCI. No angina since that time.  2. Non-ischemic cardiomyopathy-EF 25-30% Thomas cardiomyopathy is really a mixed ischemic and nonischemic. Last EF is as listed.  3. On amiodarone therapy This therapy has been discontinued  4. Essential hypertension Excellent control currently  5. Chronic systolic heart failure (HCC) No evidence of volume overload  6. Atrial  fibrillation with RVR- DCCV 06/03/14 No recurrence of clinical atrial fibrillation  7. LBBB (left bundle branch block) Not addressed. Has CRT device.    Current medicines are reviewed at length with Thomas [name] today.  Thomas [name] has Thomas following concerns regarding medicines: None.  Thomas following changes/actions have been instituted:    Urinalysis  Comprehensive metabolic panel  BNP  Set up follow-up for device clinic  2-D Doppler echocardiogram  Low-salt diet  Aerobic activity as much as tolerated  Labs/ tests ordered today include:   Orders Placed This Encounter  Procedures  . B Nat Peptide  . Comp Met (CMET)  . Urinalysis  . Echocardiogram     Disposition:   FU with HS in 6 months  Signed, Thomas Grooms, MD  06/02/2015 4:42 PM    [facility] [address], [place], [place]  [address] Phone: [phone]; Fax: [phone]

## 2015-06-02 NOTE — Addendum Note (Signed)
Addended by: Eulis Foster on: 06/02/2015 05:14 PM   Modules accepted: Orders

## 2015-06-03 LAB — BRAIN NATRIURETIC PEPTIDE: Brain Natriuretic Peptide: 352.6 pg/mL — ABNORMAL HIGH (ref 0.0–100.0)

## 2015-06-04 ENCOUNTER — Telehealth: Payer: Self-pay

## 2015-06-04 NOTE — Telephone Encounter (Signed)
-----   Message from Belva Crome, MD sent at 06/03/2015  6:13 PM EST ----- The electrolytes including potassium are normal. Kidney function has improved. No evidence of fluid buildup based upon BNP

## 2015-06-04 NOTE — Telephone Encounter (Signed)
Pt daughter aware of lab results with verbal understanding. The electrolytes including potassium are normal. Kidney function has improved. No evidence of fluid buildup based upon BNP

## 2015-06-17 ENCOUNTER — Encounter: Payer: Self-pay | Admitting: Internal Medicine

## 2015-06-17 ENCOUNTER — Other Ambulatory Visit: Payer: Self-pay

## 2015-06-17 ENCOUNTER — Ambulatory Visit (HOSPITAL_COMMUNITY): Payer: Medicare Other | Attending: Cardiology

## 2015-06-17 ENCOUNTER — Ambulatory Visit (INDEPENDENT_AMBULATORY_CARE_PROVIDER_SITE_OTHER): Payer: Medicare Other | Admitting: Internal Medicine

## 2015-06-17 VITALS — BP 122/73 | HR 64 | Ht 72.0 in | Wt 193.6 lb

## 2015-06-17 DIAGNOSIS — I358 Other nonrheumatic aortic valve disorders: Secondary | ICD-10-CM | POA: Insufficient documentation

## 2015-06-17 DIAGNOSIS — I517 Cardiomegaly: Secondary | ICD-10-CM | POA: Diagnosis not present

## 2015-06-17 DIAGNOSIS — Z95810 Presence of automatic (implantable) cardiac defibrillator: Secondary | ICD-10-CM

## 2015-06-17 DIAGNOSIS — I4891 Unspecified atrial fibrillation: Secondary | ICD-10-CM

## 2015-06-17 DIAGNOSIS — I2510 Atherosclerotic heart disease of native coronary artery without angina pectoris: Secondary | ICD-10-CM | POA: Diagnosis not present

## 2015-06-17 DIAGNOSIS — I429 Cardiomyopathy, unspecified: Secondary | ICD-10-CM | POA: Diagnosis present

## 2015-06-17 DIAGNOSIS — I5022 Chronic systolic (congestive) heart failure: Secondary | ICD-10-CM | POA: Diagnosis not present

## 2015-06-17 DIAGNOSIS — I428 Other cardiomyopathies: Secondary | ICD-10-CM

## 2015-06-17 DIAGNOSIS — I10 Essential (primary) hypertension: Secondary | ICD-10-CM | POA: Insufficient documentation

## 2015-06-17 DIAGNOSIS — I340 Nonrheumatic mitral (valve) insufficiency: Secondary | ICD-10-CM | POA: Diagnosis not present

## 2015-06-17 DIAGNOSIS — R29898 Other symptoms and signs involving the musculoskeletal system: Secondary | ICD-10-CM | POA: Diagnosis not present

## 2015-06-17 NOTE — Patient Instructions (Signed)
Medication Instructions: 1) Increase lasix (furosemide) to 80 mg in the morning and 40 mg in the afternoon x 3 days, then resume your normal dosing  Labwork: - none  Procedures/Testing: - none  Follow-Up: - Remote monitoring is used to monitor your Pacemaker of ICD from home. This monitoring reduces the number of office visits required to check your device to one time per year. It allows [place] to keep an eye on the functioning of your device to ensure it is working properly. You are scheduled for a device check from home on 09/16/15. You may send your transmission at any time that day. If you have a wireless device, the transmission will be sent automatically. After your physician reviews your transmission, you will receive a postcard with your next transmission date. (We will order you a new monitor).  - Your physician wants you to follow-up in: 1 year with Dr. Gari Crown will receive a reminder letter in the mail two months in advance. If you don't receive a letter, please call our office to schedule the follow-up appointment.  Any Additional Special Instructions Will Be Listed Below (If Applicable).

## 2015-06-17 NOTE — Progress Notes (Signed)
Patient Care Team: Tommye Standard, MD as PCP - General (Family Medicine)   HPI  Thomas [name] is a 82 y.o. male Seen in follow-up for CRT-D implanted 2012 for polymorphic ventricular tachycardia in the setting of nonischemic disease with concomitant coronary disease.Marland Kitchen He was noted a year ago to have atrial fibrillation trigger decompensated heart failure.  He developed thyrotoxicosis. Amiodarone was discontinued; and methimazole was initiated.  His thyroid condition is being followed in [place] by  Dr. Alfonso Ramus  He has exercise intolerance and peripheral edema which is worsening. Fluid intake is risk in his salt intake is not restricted  Records and Results Reviewed recent outpatient records and laboratories  Past Medical History  Diagnosis Date  . Coronary artery disease     80% distal RCA stenosis.  . Thyroid disease     HYPOTHYROIDISM  . Nontoxic multinodular goiter   . Psychosexual dysfunction with inhibited sexual excitement     ERECTILE DYSFUNCTION  . Hypertrophy of prostate without urinary obstruction and other lower urinary tract symptoms (LUTS)     BPH  . Hypertension   . Wide-complex tachycardia ([place])   . Parkinson's disease   . Status post biventricular cardiac pacemaker insertion     Status post CRT-D.    Past Surgical History  Procedure Laterality Date  . Cardiac catheterization    . Cardioversion N/A 06/03/2014    Procedure: CARDIOVERSION;  Surgeon: Thompson Grayer, MD;  Location: [facility] CATH LAB;  Service: Cardiovascular;  Laterality: N/A;  . Left heart catheterization with coronary angiogram N/A 06/10/2014    Procedure: LEFT HEART CATHETERIZATION WITH CORONARY ANGIOGRAM;  Surgeon: Sinclair Grooms, MD;  Location: [facility] CATH LAB;  Service: Cardiovascular;  Laterality: N/A;  . Abdominal angiogram  06/10/2014    Procedure: ABDOMINAL ANGIOGRAM;  Surgeon: Sinclair Grooms, MD;  Location: [facility] CATH LAB;  Service: Cardiovascular;;  . Percutaneous coronary stent  intervention (pci-s) N/A 06/12/2014    Procedure: PERCUTANEOUS CORONARY STENT INTERVENTION (PCI-S);  Surgeon: Sinclair Grooms, MD;  Location: [facility] CATH LAB;  Service: Cardiovascular;  Laterality: N/A;  . Percutaneous coronary rotoblator intervention (pci-r)  06/12/2014    Procedure: PERCUTANEOUS CORONARY ROTOBLATOR INTERVENTION (PCI-R);  Surgeon: Sinclair Grooms, MD;  Location: [facility] CATH LAB;  Service: Cardiovascular;;  . Cardiac catheterization  06/12/2014    Procedure: CORONARY STENT INTERVENTION W/IMPELLA;  Surgeon: Sinclair Grooms, MD;  Location: [facility] CATH LAB;  Service: Cardiovascular;;    Current Outpatient Prescriptions  Medication Sig Dispense Refill  . acetaminophen (TYLENOL) 325 MG tablet Take 650 mg by mouth every 4 (four) hours as needed for mild pain.    Marland Kitchen amantadine (SYMMETREL) 100 MG capsule Take 100 mg by mouth 2 (two) times daily.     Marland Kitchen aspirin EC 81 MG EC tablet Take 1 tablet (81 mg total) by mouth daily.    Marland Kitchen b complex vitamins tablet Take 1 tablet by mouth daily. Reported on 06/02/2015    . carbidopa-levodopa (SINEMET IR) 25-100 MG per tablet Take 1 tablet by mouth 3 (three) times daily. 30 tablet 0  . clopidogrel (PLAVIX) 75 MG tablet Take 75 mg by mouth daily.    Marland Kitchen escitalopram (LEXAPRO) 10 MG tablet Take 10 mg by mouth daily.    . furosemide (LASIX) 40 MG tablet Take 40 mg by mouth 2 (two) times daily.     Marland Kitchen ipratropium-albuterol (DUONEB) 0.5-2.5 (3) MG/3ML SOLN Take 3 mLs by nebulization 4 (four) times daily.    Marland Kitchen  lactulose (CHRONULAC) 10 GM/15ML solution Take 30 mLs (20 g total) by mouth daily as needed for mild constipation. 240 mL 0  . lisinopril (PRINIVIL,ZESTRIL) 2.5 MG tablet Take 2.5 mg by mouth daily.    . magnesium hydroxide (MILK OF MAGNESIA) 400 MG/5ML suspension Take 30 mLs by mouth at bedtime as needed for mild constipation.    . methimazole (TAPAZOLE) 10 MG tablet Take 1 tablet (10 mg total) by mouth daily. 30 tablet 0  . Multiple Vitamins-Minerals (ICAPS  MV PO) Take 2 by mouth twice daily     . nitroGLYCERIN (NITROSTAT) 0.4 MG SL tablet Place 0.4 mg under the tongue every 5 (five) minutes as needed for chest pain.     . Omega-3 Fatty Acids (FISH OIL) 1000 MG CAPS Take one by mouth twice daily      . omeprazole (PRILOSEC) 20 MG capsule Take 1 capsule (20 mg total) by mouth daily. 30 capsule 11  . potassium chloride SA (K-DUR,KLOR-CON) 20 MEQ tablet Take 20 mEq by mouth daily.    . pramipexole (MIRAPEX) 0.25 MG tablet Take 0.25 mg by mouth at bedtime.    . rotigotine (NEUPRO) 6 MG/24HR Place 1 patch onto the skin daily.     No current facility-administered medications for this visit.    Allergies  Allergen Reactions  . Ciprofloxacin Other (See Comments)    "PER MD"  . Levaquin [Levofloxacin] Other (See Comments)    REACTION: "OUT OF HEAD/ SEIZURES"      Review of Systems negative except from HPI and PMH  Physical Exam BP 122/73 mmHg  Pulse 64  Ht 6' (1.829 m)  Wt 193 lb 9.6 oz (87.816 kg)  BMI 26.25 kg/m2  SpO2 92% Well developed and well nourished in no acute distress HENT normal E scleral and icterus clear Neck Supple JVP flat; carotids brisk and full Clear to ausculation Device pocket well healed; without hematoma or erythema.  There is no tethering Regular rate and rhythm, no murmurs gallops or rub Soft with active bowel sounds No clubbing cyanosis 2+ Edema Alert and oriented, grossly normal motor and sensory function Skin Warm and Dry    Assessment and  Plan  Atrial fibrillation-paroxysmal  No intercurrent atrial fibrillation or flutter  Nonischemic cardiomyopathy  Congestive heart failure-chronic-  Implantable defibrillator-CRT-[employer]  The patient's device was interrogated.  The information was reviewed. No changes were made in the programming.     Coronary artery disease  Hyyperthroidism  The patient has paroxysmal atrial fibrillation and is not on anticoagulation. I suspect it is related to his fall  risk but I don't have that documented. I will touch base with his primary cardiologist.  He is volume overloaded. We have discussed the importance of salt and fluid restriction. We'll increase his furosemide 40 /40---80/40 for 3 days.  His renal insufficiency is modest with a creatinine of 1.53  Without symptoms of ischemia

## 2015-06-22 ENCOUNTER — Telehealth: Payer: Self-pay

## 2015-06-22 NOTE — Telephone Encounter (Signed)
-----   Message from Belva Crome, MD sent at 06/20/2015  3:56 PM EST ----- Improved but still significant dysfunction EF improved from 25-30% to 35%. Pressure in lungs back to normal.

## 2015-06-22 NOTE — Telephone Encounter (Signed)
Pt daughter  [name] aware of echo results with verbal understanding. Improved but still significant dysfunction EF improved from 25-30% to 35%. Pressure in lungs back to normal.

## 2015-07-07 LAB — CUP PACEART INCLINIC DEVICE CHECK
Battery Voltage: 2.95 V
Brady Statistic AP VS Percent: 0.2 %
Brady Statistic AS VS Percent: 0.61 %
HIGH POWER IMPEDANCE MEASURED VALUE: 361 Ohm
HIGH POWER IMPEDANCE MEASURED VALUE: 41 Ohm
HIGH POWER IMPEDANCE MEASURED VALUE: 52 Ohm
Implantable Lead Implant Date: 20120827
Implantable Lead Location: 753859
Implantable Lead Location: 753860
Implantable Lead Model: 4194
Implantable Lead Model: 5076
Lead Channel Impedance Value: 285 Ohm
Lead Channel Impedance Value: 456 Ohm
Lead Channel Impedance Value: 513 Ohm
Lead Channel Pacing Threshold Amplitude: 0.5 V
Lead Channel Pacing Threshold Amplitude: 0.75 V
Lead Channel Pacing Threshold Amplitude: 0.75 V
Lead Channel Pacing Threshold Pulse Width: 0.4 ms
Lead Channel Pacing Threshold Pulse Width: 0.4 ms
Lead Channel Setting Pacing Amplitude: 2 V
Lead Channel Setting Pacing Amplitude: 2 V
Lead Channel Setting Pacing Pulse Width: 0.6 ms
Lead Channel Setting Sensing Sensitivity: 0.3 mV
MDC IDC LEAD IMPLANT DT: 20120827
MDC IDC LEAD IMPLANT DT: 20120827
MDC IDC LEAD LOCATION: 753858
MDC IDC MSMT LEADCHNL LV IMPEDANCE VALUE: 646 Ohm
MDC IDC MSMT LEADCHNL LV PACING THRESHOLD PULSEWIDTH: 0.6 ms
MDC IDC MSMT LEADCHNL RA IMPEDANCE VALUE: 342 Ohm
MDC IDC MSMT LEADCHNL RA SENSING INTR AMPL: 0.875 mV
MDC IDC MSMT LEADCHNL RV SENSING INTR AMPL: 13.625 mV
MDC IDC SESS DTM: 20170104221325
MDC IDC SET LEADCHNL LV PACING AMPLITUDE: 1.25 V
MDC IDC SET LEADCHNL RV PACING PULSEWIDTH: 0.4 ms
MDC IDC STAT BRADY AP VP PERCENT: 68.01 %
MDC IDC STAT BRADY AS VP PERCENT: 31.18 %
MDC IDC STAT BRADY RA PERCENT PACED: 68.21 %
MDC IDC STAT BRADY RV PERCENT PACED: 99.19 %

## 2015-07-27 ENCOUNTER — Other Ambulatory Visit: Payer: Self-pay | Admitting: Interventional Cardiology

## 2015-07-27 MED ORDER — OMEPRAZOLE 20 MG PO CPDR
20.0000 mg | DELAYED_RELEASE_CAPSULE | Freq: Every day | ORAL | Status: DC
Start: 2015-07-27 — End: 2015-10-16

## 2015-09-08 ENCOUNTER — Telehealth: Payer: Self-pay | Admitting: Interventional Cardiology

## 2015-09-08 NOTE — Telephone Encounter (Signed)
Will route to Dr. Smith for review.  

## 2015-09-08 NOTE — Telephone Encounter (Signed)
New message      Pt saw his PCP yesterday.  Bp was 80/60 in their office.  PCP gave him fludrocortisone to take every Monday and Friday to raise his bp.  Is this ok with Dr Tamala Julian?

## 2015-09-10 NOTE — Telephone Encounter (Signed)
If he is not feeling poorly, I would not use the fludrocortisone.

## 2015-09-14 NOTE — Telephone Encounter (Signed)
lmom for Thomas [name] pt daughter. If he is not feeling poorly, I would not use the fludrocortisone. They are to call back in any questions or concerns

## 2015-09-16 ENCOUNTER — Ambulatory Visit (INDEPENDENT_AMBULATORY_CARE_PROVIDER_SITE_OTHER): Payer: Medicare Other | Admitting: *Deleted

## 2015-09-16 DIAGNOSIS — I429 Cardiomyopathy, unspecified: Secondary | ICD-10-CM

## 2015-09-16 DIAGNOSIS — I428 Other cardiomyopathies: Secondary | ICD-10-CM

## 2015-09-17 ENCOUNTER — Telehealth: Payer: Self-pay | Admitting: *Deleted

## 2015-09-17 NOTE — Telephone Encounter (Signed)
Patient's son-in-law called for assistance in setting up patient's new [employer] monitor and [place].  Walked son-in-law and patient through transmission process.  Transmission received, reviewed results with patient and son-in-law.  Both deny questions or concerns at this time and are appreciative of assistance.

## 2015-09-18 NOTE — Progress Notes (Signed)
Remote ICD transmission.   

## 2015-10-16 ENCOUNTER — Other Ambulatory Visit: Payer: Self-pay

## 2015-10-16 MED ORDER — OMEPRAZOLE 20 MG PO CPDR: 20.0000 mg | DELAYED_RELEASE_CAPSULE | Freq: Every day | ORAL | Status: DC

## 2015-10-16 MED ORDER — CLOPIDOGREL BISULFATE 75 MG PO TABS: 75.0000 mg | ORAL_TABLET | Freq: Every day | ORAL | Status: AC

## 2015-10-30 LAB — CUP PACEART REMOTE DEVICE CHECK
Battery Voltage: 2.9 V
Brady Statistic AP VP Percent: 15.74 %
Brady Statistic AS VP Percent: 83.47 %
Brady Statistic RA Percent Paced: 15.77 %
HighPow Impedance: 342 Ohm
HighPow Impedance: 40 Ohm
HighPow Impedance: 51 Ohm
Implantable Lead Implant Date: 20120827
Implantable Lead Implant Date: 20120827
Implantable Lead Implant Date: 20120827
Implantable Lead Location: 753858
Implantable Lead Location: 753860
Implantable Lead Model: 4194
Implantable Lead Model: 6947
Lead Channel Impedance Value: 361 Ohm
Lead Channel Impedance Value: 665 Ohm
Lead Channel Pacing Threshold Amplitude: 0.625 V
Lead Channel Pacing Threshold Amplitude: 0.75 V
Lead Channel Pacing Threshold Pulse Width: 0.4 ms
Lead Channel Sensing Intrinsic Amplitude: 1.75 mV
Lead Channel Sensing Intrinsic Amplitude: 1.75 mV
Lead Channel Setting Pacing Amplitude: 2 V
MDC IDC LEAD LOCATION: 753859
MDC IDC MSMT LEADCHNL LV IMPEDANCE VALUE: 304 Ohm
MDC IDC MSMT LEADCHNL LV IMPEDANCE VALUE: 532 Ohm
MDC IDC MSMT LEADCHNL LV PACING THRESHOLD PULSEWIDTH: 0.6 ms
MDC IDC MSMT LEADCHNL RA PACING THRESHOLD PULSEWIDTH: 0.4 ms
MDC IDC MSMT LEADCHNL RV IMPEDANCE VALUE: 475 Ohm
MDC IDC MSMT LEADCHNL RV PACING THRESHOLD AMPLITUDE: 0.875 V
MDC IDC MSMT LEADCHNL RV SENSING INTR AMPL: 9.5 mV
MDC IDC MSMT LEADCHNL RV SENSING INTR AMPL: 9.5 mV
MDC IDC SESS DTM: 20170406200324
MDC IDC SET LEADCHNL LV PACING AMPLITUDE: 1.25 V
MDC IDC SET LEADCHNL LV PACING PULSEWIDTH: 0.6 ms
MDC IDC SET LEADCHNL RA PACING AMPLITUDE: 2 V
MDC IDC SET LEADCHNL RV PACING PULSEWIDTH: 0.4 ms
MDC IDC SET LEADCHNL RV SENSING SENSITIVITY: 0.3 mV
MDC IDC STAT BRADY AP VS PERCENT: 0.03 %
MDC IDC STAT BRADY AS VS PERCENT: 0.76 %
MDC IDC STAT BRADY RV PERCENT PACED: 99.2 %

## 2015-11-03 ENCOUNTER — Encounter: Payer: Self-pay | Admitting: Cardiology

## 2015-12-16 ENCOUNTER — Telehealth: Payer: Self-pay | Admitting: Cardiology

## 2015-12-16 ENCOUNTER — Ambulatory Visit (INDEPENDENT_AMBULATORY_CARE_PROVIDER_SITE_OTHER): Payer: Medicare Other | Admitting: *Deleted

## 2015-12-16 DIAGNOSIS — I429 Cardiomyopathy, unspecified: Secondary | ICD-10-CM

## 2015-12-16 DIAGNOSIS — I428 Other cardiomyopathies: Secondary | ICD-10-CM

## 2015-12-16 NOTE — Progress Notes (Signed)
Remote ICD transmission.   

## 2015-12-16 NOTE — Telephone Encounter (Signed)
Confirmed remote transmission w/ pt daughter.   

## 2015-12-18 LAB — CUP PACEART REMOTE DEVICE CHECK
Brady Statistic AP VP Percent: 27.35 %
Brady Statistic AS VP Percent: 71.25 %
Brady Statistic AS VS Percent: 1.34 %
HIGH POWER IMPEDANCE MEASURED VALUE: 361 Ohm
HIGH POWER IMPEDANCE MEASURED VALUE: 43 Ohm
HighPow Impedance: 55 Ohm
Implantable Lead Implant Date: 20120827
Implantable Lead Location: 753858
Implantable Lead Location: 753860
Implantable Lead Model: 6947
Lead Channel Impedance Value: 285 Ohm
Lead Channel Impedance Value: 513 Ohm
Lead Channel Impedance Value: 532 Ohm
Lead Channel Impedance Value: 665 Ohm
Lead Channel Pacing Threshold Amplitude: 0.625 V
Lead Channel Pacing Threshold Amplitude: 0.625 V
Lead Channel Sensing Intrinsic Amplitude: 2.125 mV
Lead Channel Sensing Intrinsic Amplitude: 24.25 mV
Lead Channel Setting Pacing Amplitude: 2 V
Lead Channel Setting Pacing Pulse Width: 0.4 ms
Lead Channel Setting Sensing Sensitivity: 0.3 mV
MDC IDC LEAD IMPLANT DT: 20120827
MDC IDC LEAD IMPLANT DT: 20120827
MDC IDC LEAD LOCATION: 753859
MDC IDC LEAD MODEL: 4194
MDC IDC MSMT BATTERY VOLTAGE: 2.84 V
MDC IDC MSMT LEADCHNL LV PACING THRESHOLD PULSEWIDTH: 0.6 ms
MDC IDC MSMT LEADCHNL RA IMPEDANCE VALUE: 342 Ohm
MDC IDC MSMT LEADCHNL RA PACING THRESHOLD PULSEWIDTH: 0.4 ms
MDC IDC MSMT LEADCHNL RA SENSING INTR AMPL: 2.125 mV
MDC IDC MSMT LEADCHNL RV PACING THRESHOLD AMPLITUDE: 0.875 V
MDC IDC MSMT LEADCHNL RV PACING THRESHOLD PULSEWIDTH: 0.4 ms
MDC IDC MSMT LEADCHNL RV SENSING INTR AMPL: 24.25 mV
MDC IDC SESS DTM: 20170705201457
MDC IDC SET LEADCHNL LV PACING AMPLITUDE: 1.25 V
MDC IDC SET LEADCHNL LV PACING PULSEWIDTH: 0.6 ms
MDC IDC SET LEADCHNL RA PACING AMPLITUDE: 2 V
MDC IDC STAT BRADY AP VS PERCENT: 0.06 %
MDC IDC STAT BRADY RA PERCENT PACED: 27.41 %
MDC IDC STAT BRADY RV PERCENT PACED: 98.6 %

## 2015-12-23 ENCOUNTER — Encounter: Payer: Self-pay | Admitting: Cardiology

## 2016-01-13 IMAGING — CT CT HEAD W/O CM
1 of 2 series · 13 of 30 positions shown, 17 images · non-contrast
Comparison: None

CLINICAL DATA: Confusion, recent fall, altered mental status.

EXAM:
CT HEAD WITHOUT CONTRAST
TECHNIQUE: Contiguous axial images were obtained from the base of the skull
through the vertex without contrast.

[Series 2: head 5.0 h30s · axial · 0.46mm/px · z∈[-94,+51]mm · 13 of 35 slices shown, 17 images]
[im 3/35  brain]
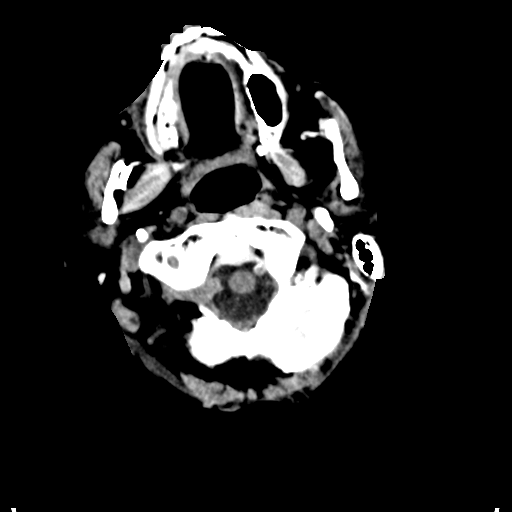
[im 3/35  bone]
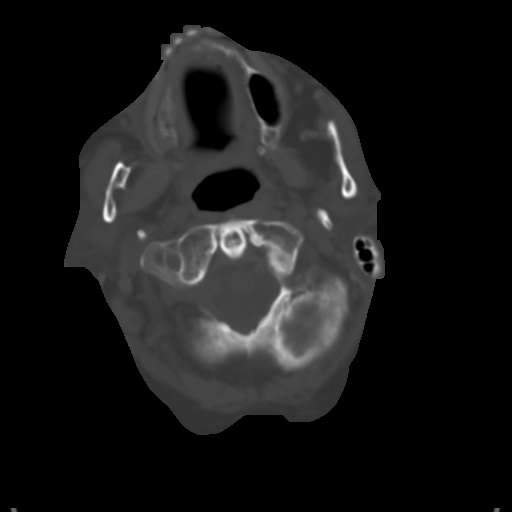
[im 5/35  brain]
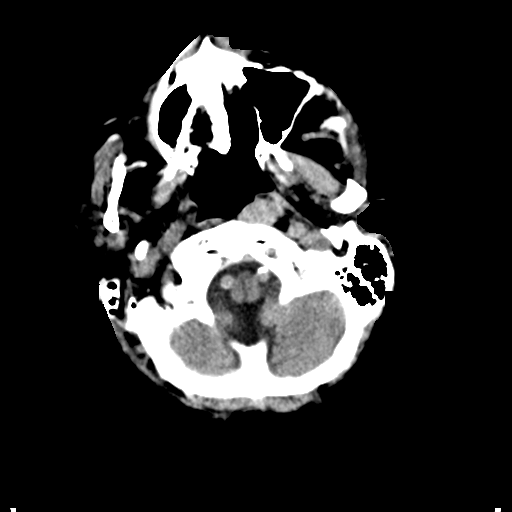
[im 8/35  brain]
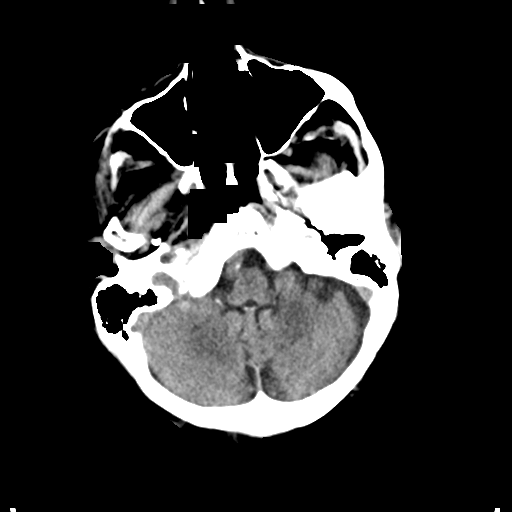
[im 10/35  brain]
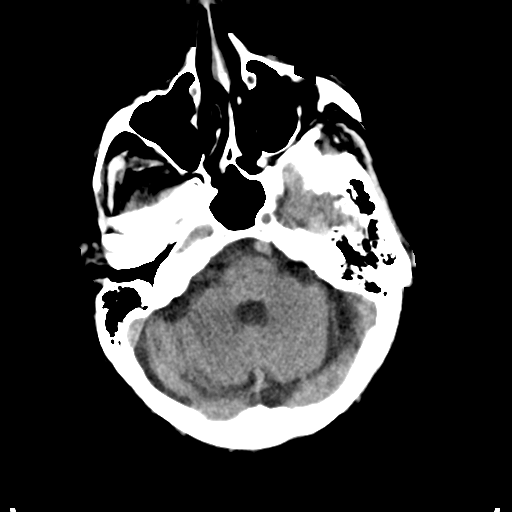
[im 13/35  brain]
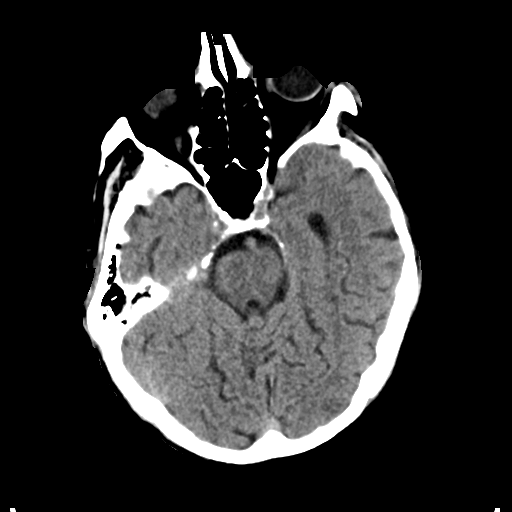
[im 13/35  bone]
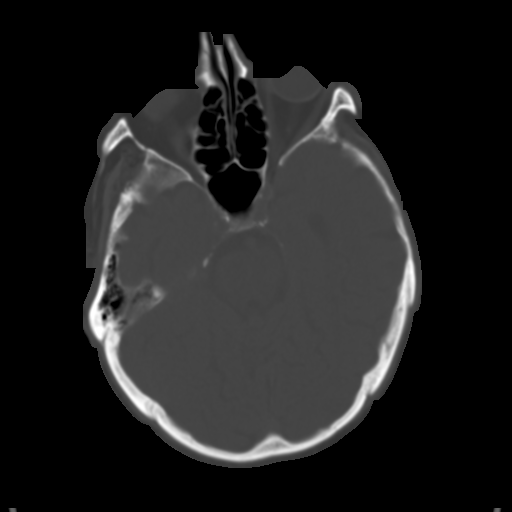
[im 15/35  brain]
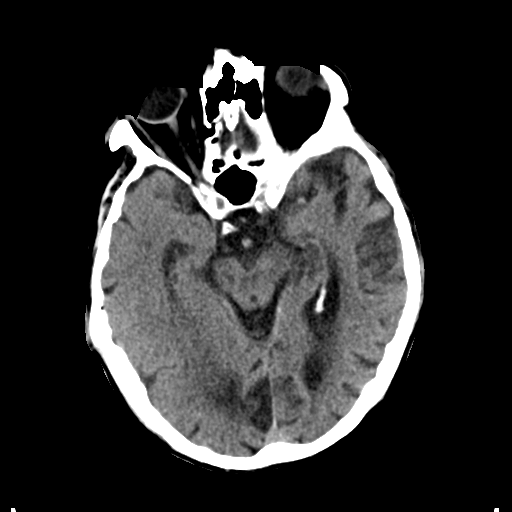
[im 18/35  brain]
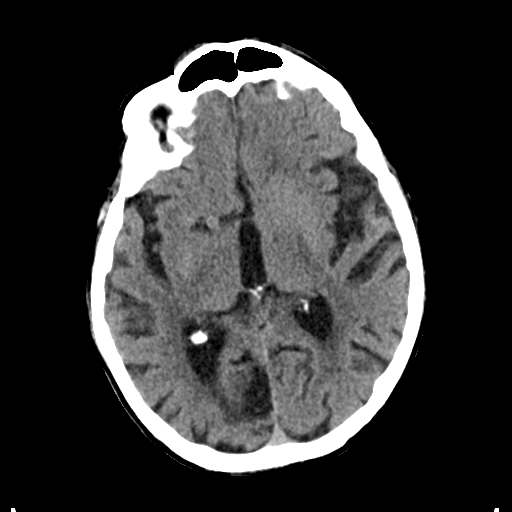
[im 20/35  brain]
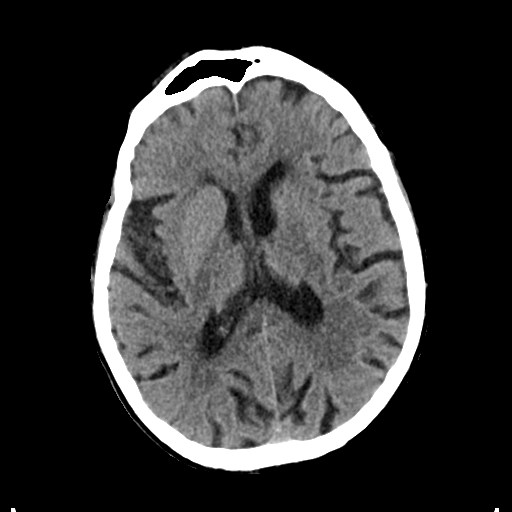
[im 22/35  brain]
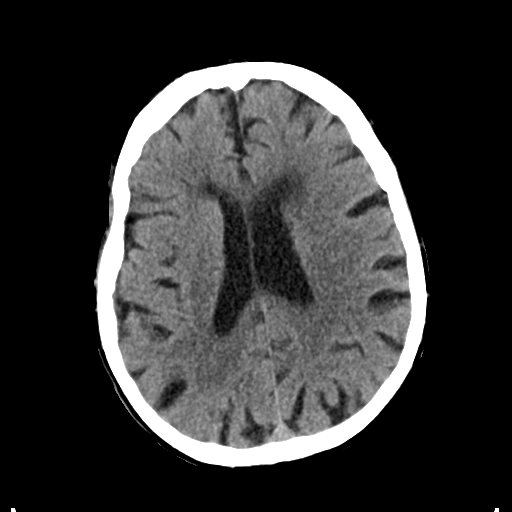
[im 22/35  bone]
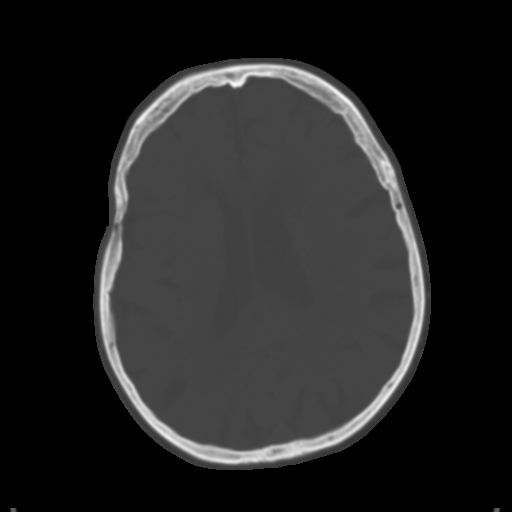
[im 25/35  brain]
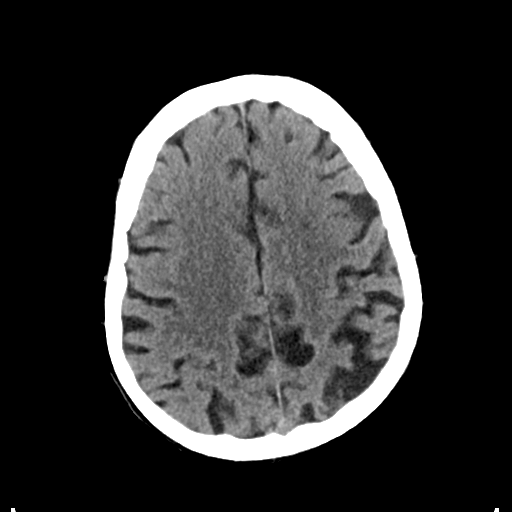
[im 27/35  brain]
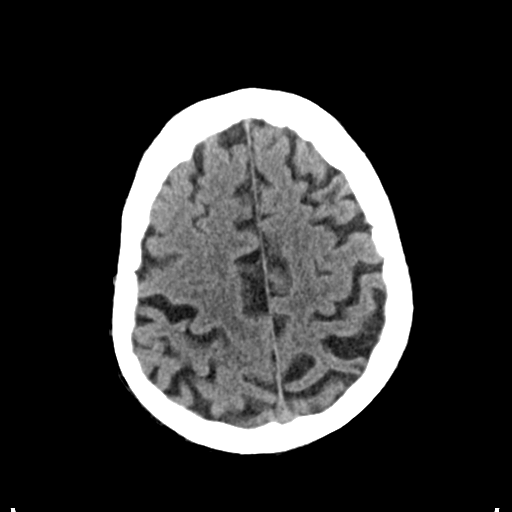
[im 30/35  brain]
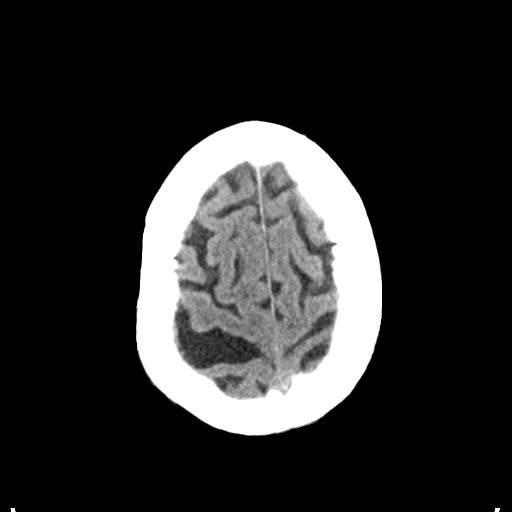
[im 32/35  brain]
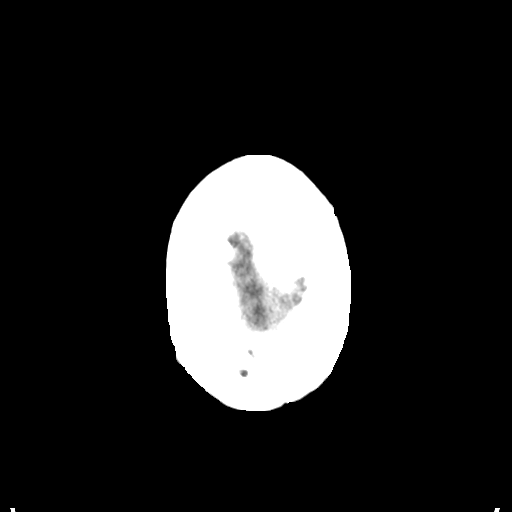
[im 32/35  bone]
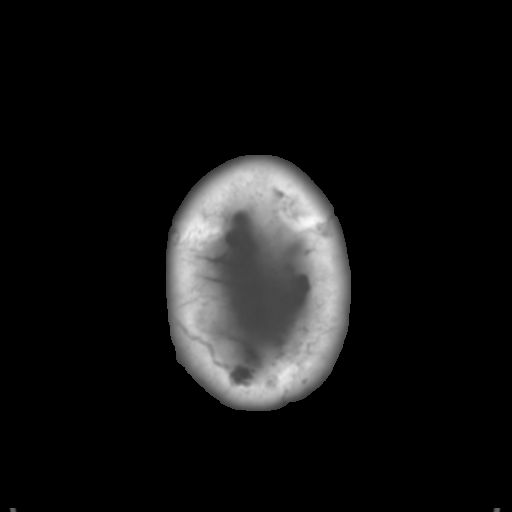

[13 of 30 positions shown; findings below may reference images not displayed]

FINDINGS: Diffuse brain atrophy and chronic white matter microvascular
ischemic changes throughout the cerebral hemispheres. Remote right
occipital infarct with encephalomalacia. No acute intracranial
hemorrhage, definite acute infarction, mass lesion, midline shift,
herniation, hydrocephalus, or extra-axial fluid collection. Cisterns
are patent. Cerebellar atrophy as well. Atherosclerosis of the
intracranial vessels at the skullbase. Orbits are symmetric.
Mastoids and sinuses remain clear.
IMPRESSION: Atrophy, chronic white matter ischemic change, and remote right
occipital PCA territory infarct.

Intracranial atherosclerosis

No acute intracranial finding by noncontrast CT.

## 2016-01-13 IMAGING — DX DG CHEST 2V
2 series · 2 of 2 positions shown · non-contrast
Comparison: November 04, 2014

CLINICAL DATA: Shortness of breath for 2 months

EXAM:
CHEST  2 VIEW

[chest lat]
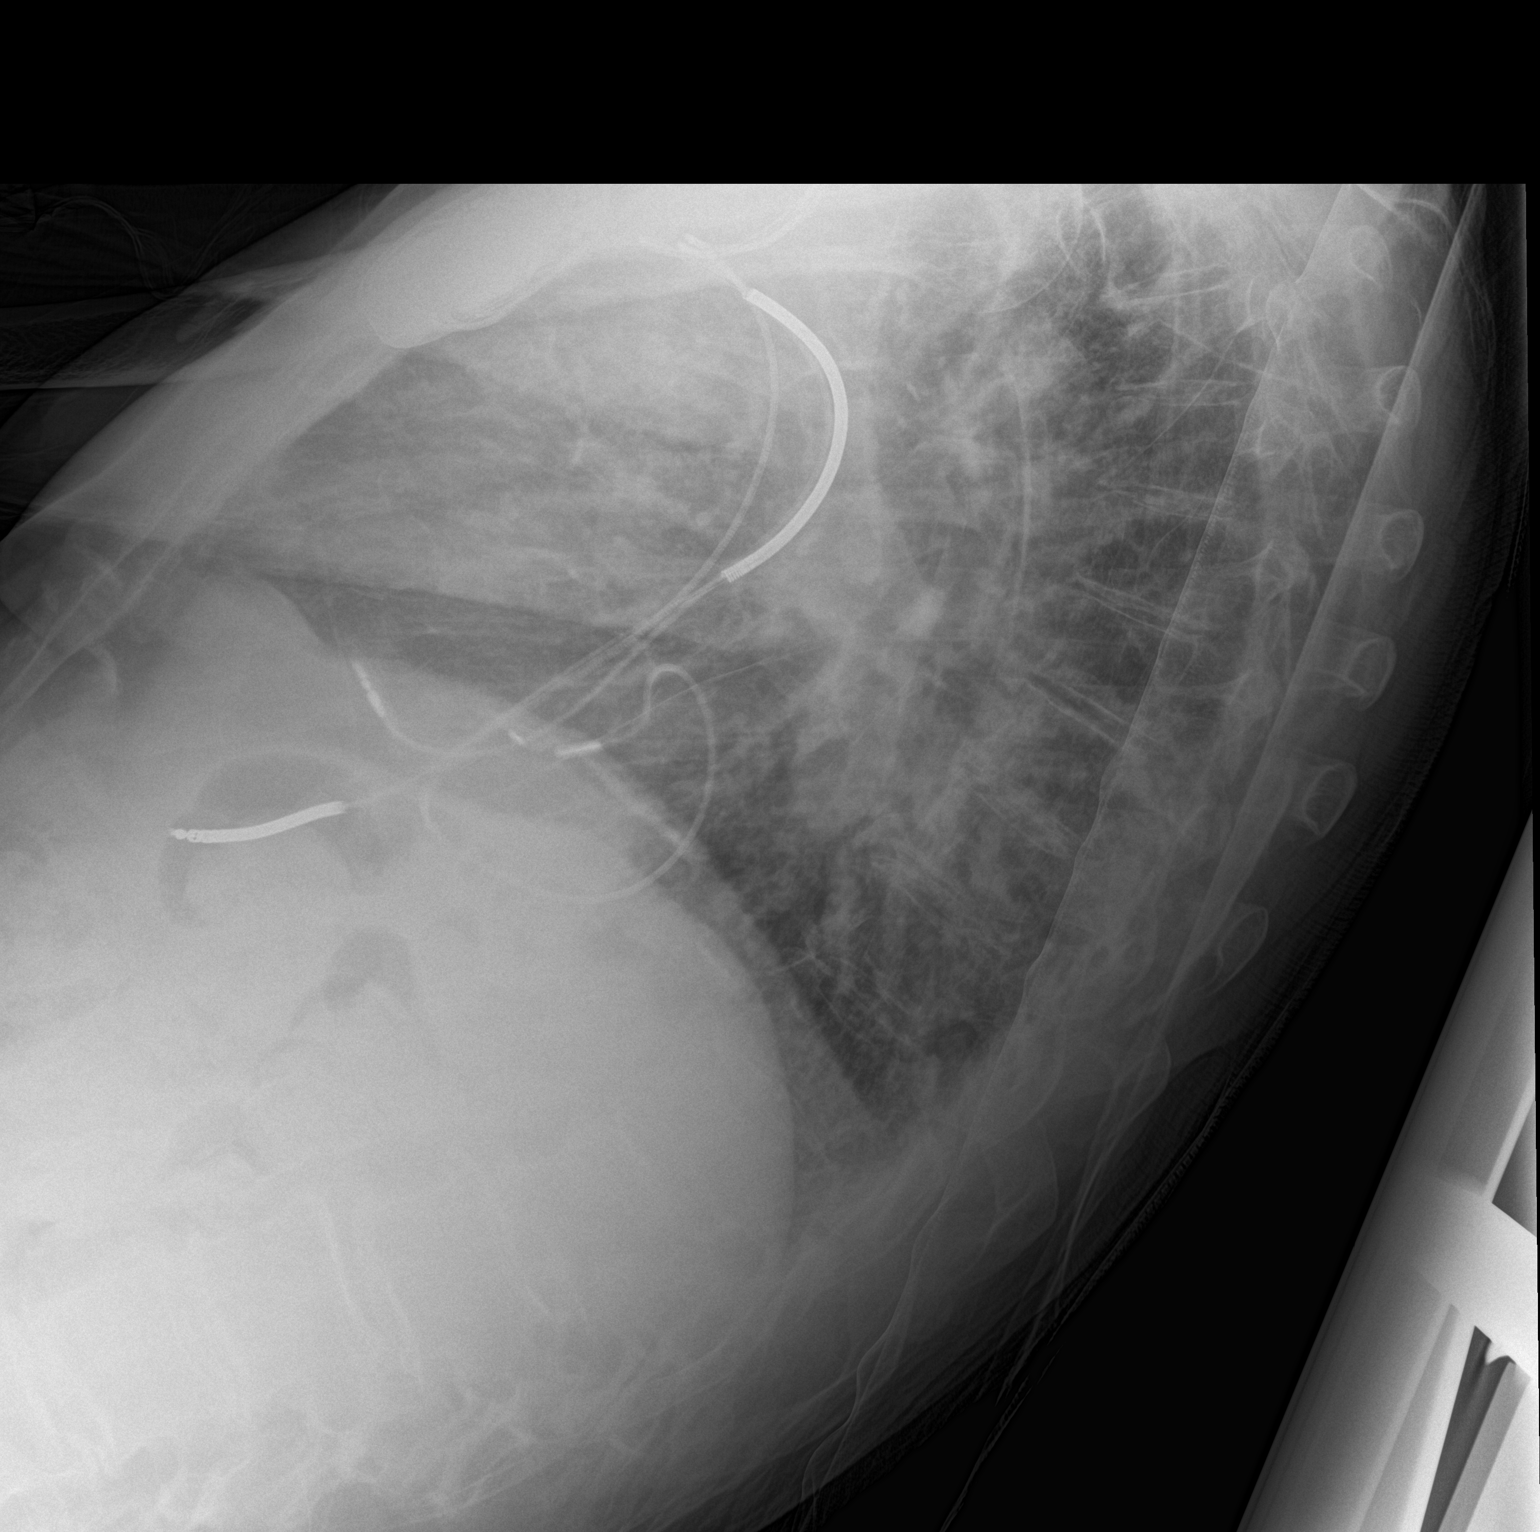

[chest ap]
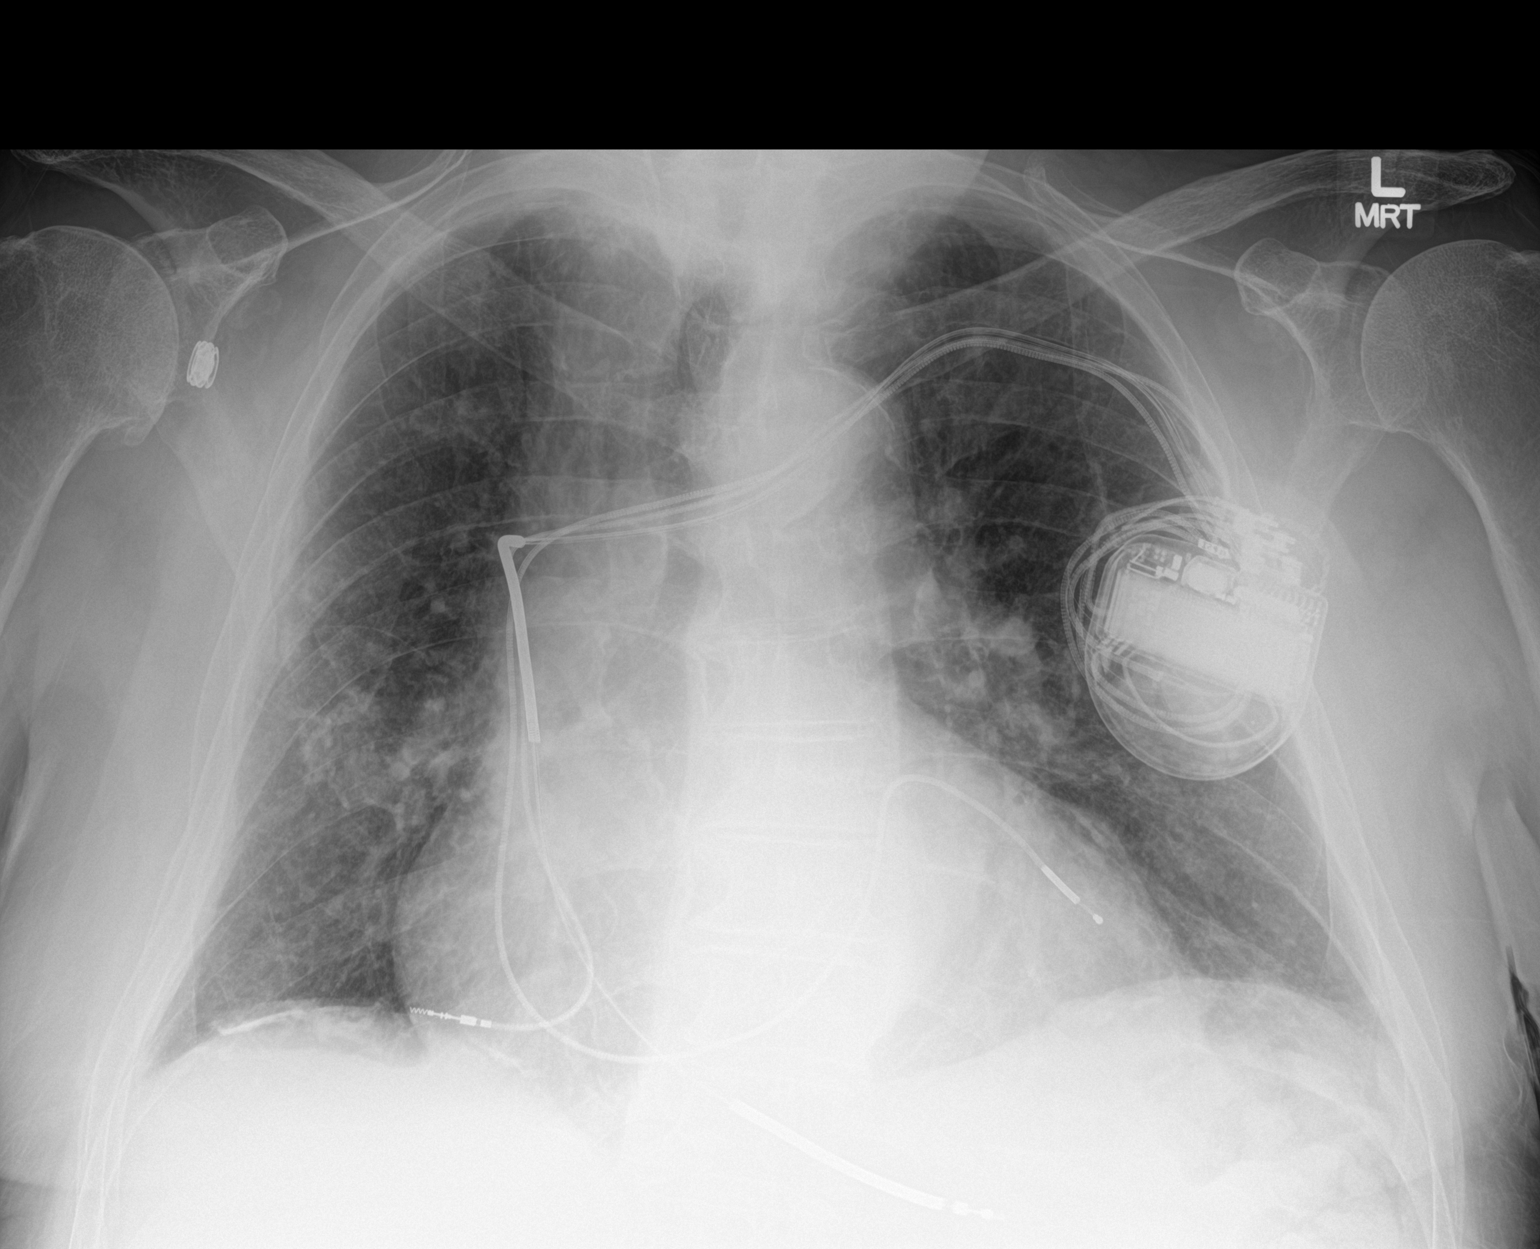

[2 of 2 positions shown; findings below may reference images not displayed]

FINDINGS: The heart size and mediastinal contours are stable. The heart size
is enlarged. The aorta is tortuous. Cardiac pacemaker is unchanged.
There is central pulmonary vascular congestion without frank edema.
There is no focal pneumonia or pleural effusion. Right calcified
pleural plaque is unchanged. The visualized skeletal structures are
stable.
IMPRESSION: Cardiomegaly and central pulmonary vascular congestion unchanged
compared to prior exam. No focal pneumonia.

## 2016-03-09 ENCOUNTER — Ambulatory Visit: Payer: Medicare Other | Admitting: Interventional Cardiology

## 2016-03-11 ENCOUNTER — Ambulatory Visit: Payer: Medicare Other | Admitting: Interventional Cardiology

## 2016-03-15 ENCOUNTER — Ambulatory Visit (INDEPENDENT_AMBULATORY_CARE_PROVIDER_SITE_OTHER): Payer: Medicare Other | Admitting: Interventional Cardiology

## 2016-03-15 ENCOUNTER — Encounter: Payer: Self-pay | Admitting: Interventional Cardiology

## 2016-03-15 VITALS — BP 110/64 | HR 62 | Ht 72.0 in | Wt 199.4 lb

## 2016-03-15 DIAGNOSIS — I5022 Chronic systolic (congestive) heart failure: Secondary | ICD-10-CM

## 2016-03-15 DIAGNOSIS — I447 Left bundle-branch block, unspecified: Secondary | ICD-10-CM | POA: Diagnosis not present

## 2016-03-15 DIAGNOSIS — I2510 Atherosclerotic heart disease of native coronary artery without angina pectoris: Secondary | ICD-10-CM | POA: Diagnosis not present

## 2016-03-15 DIAGNOSIS — I428 Other cardiomyopathies: Secondary | ICD-10-CM

## 2016-03-15 DIAGNOSIS — Z950 Presence of cardiac pacemaker: Secondary | ICD-10-CM

## 2016-03-15 DIAGNOSIS — Z79899 Other long term (current) drug therapy: Secondary | ICD-10-CM

## 2016-03-15 DIAGNOSIS — I4891 Unspecified atrial fibrillation: Secondary | ICD-10-CM

## 2016-03-15 LAB — BASIC METABOLIC PANEL
BUN: 23 mg/dL (ref 7–25)
CO2: 32 mmol/L — ABNORMAL HIGH (ref 20–31)
Calcium: 9.2 mg/dL (ref 8.6–10.3)
Chloride: 100 mmol/L (ref 98–110)
Creat: 1.51 mg/dL — ABNORMAL HIGH (ref 0.70–1.11)
GLUCOSE: 95 mg/dL (ref 65–99)
POTASSIUM: 4.3 mmol/L (ref 3.5–5.3)
SODIUM: 142 mmol/L (ref 135–146)

## 2016-03-15 NOTE — Progress Notes (Signed)
Cardiology Office Note    Date:  03/15/2016   ID:  Thomas [name], DOB 1933-10-25, MRN [id number]  PCP:  Tommye Standard, MD  Cardiologist: Sinclair Grooms, MD   Chief Complaint  Patient presents with  . Congestive Heart Failure    History of Present Illness:  Thomas [name] is a 82 y.o. male who presents for follow-up of chronic systolic heart failure, coronary artery disease with prior CHIP LAD and circumflex within Impela support, history of atrial fibrillation, Parkinson's disease, and prior history of wide-complex tachycardia.Other problems include hypertension, orthostatic hypotension, hyperthyroidism, and prior history of wide complex tachycardia.  He denies angina. He has not needed to use nitroglycerin. He gets significant weakness when he stands. In reviewing his medical list, the primary physician has started Florinef. Furosemide dose however has been left the same at 40 mg twice a day. He wants to note he can get off of some of the medications. Unfortunately most of the medications are not cardiac related. The only cardiac medications are furosemide, aspirin, and Plavix.  Past Medical History:  Diagnosis Date  . Coronary artery disease    80% distal RCA stenosis.  . Hypertension   . Hypertrophy of prostate without urinary obstruction and other lower urinary tract symptoms (LUTS)    BPH  . Nontoxic multinodular goiter   . Parkinson's disease   . Psychosexual dysfunction with inhibited sexual excitement    ERECTILE DYSFUNCTION  . Status post biventricular cardiac pacemaker insertion    Status post CRT-D.  Marland Kitchen Thyroid disease    HYPOTHYROIDISM  . Wide-complex tachycardia ([place])     Past Surgical History:  Procedure Laterality Date  . ABDOMINAL ANGIOGRAM  06/10/2014   Procedure: ABDOMINAL ANGIOGRAM;  Surgeon: Sinclair Grooms, MD;  Location: [facility] CATH LAB;  Service: Cardiovascular;;  . CARDIAC CATHETERIZATION    . CARDIAC CATHETERIZATION  06/12/2014   Procedure: CORONARY  STENT INTERVENTION W/IMPELLA;  Surgeon: Sinclair Grooms, MD;  Location: [facility] CATH LAB;  Service: Cardiovascular;;  . CARDIOVERSION N/A 06/03/2014   Procedure: CARDIOVERSION;  Surgeon: Thompson Grayer, MD;  Location: [facility] CATH LAB;  Service: Cardiovascular;  Laterality: N/A;  . LEFT HEART CATHETERIZATION WITH CORONARY ANGIOGRAM N/A 06/10/2014   Procedure: LEFT HEART CATHETERIZATION WITH CORONARY ANGIOGRAM;  Surgeon: Sinclair Grooms, MD;  [facility] C[facility] LAB;  Service: Cardiovascular;  Laterality: N/A;  . PERCUTANEOUS CORONARY ROTOBLATOR INTERVENTION (PCI-R)  06/12/2014   Procedure: PERCUTANEOUS CORONARY ROTOBLATOR INTERVENTION (PCI-R);  Surgeon: Sinclair Grooms, MD;  Location: [facility] CATH LAB;  Service: Cardiovascular;;  . PERCUTANEOUS CORONARY STENT INTERVENTION (PCI-S) N/A 06/12/2014   Procedure: PERCUTANEOUS CORONARY STENT INTERVENTION (PCI-S);  Surgeon: Sinclair Grooms, MD;  Location: [facility] CATH LAB;  Service: Cardiovascular;  Laterality: N/A;    Current Medications: Outpatient Medications Prior to Visit  Medication Sig Dispense Refill  . acetaminophen (TYLENOL) 325 MG tablet Take 650 mg by mouth every 4 (four) hours as needed for mild pain.    Marland Kitchen amantadine (SYMMETREL) 100 MG capsule Take 100 mg by mouth 2 (two) times daily.     Marland Kitchen aspirin EC 81 MG EC tablet Take 1 tablet (81 mg total) by mouth daily.    . carbidopa-levodopa (SINEMET IR) 25-100 MG per tablet Take 1 tablet by mouth 3 (three) times daily. 30 tablet 0  . clopidogrel (PLAVIX) 75 MG tablet Take 1 tablet (75 mg total) by mouth daily. 90 tablet 0  . escitalopram (LEXAPRO) 10 MG tablet Take 10 mg  by mouth daily.    . furosemide (LASIX) 40 MG tablet Take 40 mg by mouth 2 (two) times daily.     . methimazole (TAPAZOLE) 10 MG tablet Take 1 tablet (10 mg total) by mouth daily. 30 tablet 0  . nitroGLYCERIN (NITROSTAT) 0.4 MG SL tablet Place 0.4 mg under the tongue every 5 (five) minutes as needed for chest pain.     . Omega-3 Fatty Acids  (FISH OIL) 1000 MG CAPS Take one by mouth twice daily      . potassium chloride SA (K-DUR,KLOR-CON) 20 MEQ tablet Take 20 mEq by mouth daily.    . rotigotine (NEUPRO) 6 MG/24HR Place 1 patch onto the skin daily.    Marland Kitchen b complex vitamins tablet Take 1 tablet by mouth daily. Reported on 06/02/2015    . ipratropium-albuterol (DUONEB) 0.5-2.5 (3) MG/3ML SOLN Take 3 mLs by nebulization 4 (four) times daily.    Marland Kitchen lactulose (CHRONULAC) 10 GM/15ML solution Take 30 mLs (20 g total) by mouth daily as needed for mild constipation. (Patient not taking: Reported on 03/15/2016) 240 mL 0  . lisinopril (PRINIVIL,ZESTRIL) 2.5 MG tablet Take 2.5 mg by mouth daily.    . magnesium hydroxide (MILK OF MAGNESIA) 400 MG/5ML suspension Take 30 mLs by mouth at bedtime as needed for mild constipation.    . Multiple Vitamins-Minerals (ICAPS MV PO) Take 2 by mouth twice daily     . omeprazole (PRILOSEC) 20 MG capsule Take 1 capsule (20 mg total) by mouth daily. (Patient not taking: Reported on 03/15/2016) 90 capsule 0  . pramipexole (MIRAPEX) 0.25 MG tablet Take 0.25 mg by mouth at bedtime.     No facility-administered medications prior to visit.      Allergies:   Ciprofloxacin and Levaquin [levofloxacin]   Social History   Social History  . Marital status: Widowed    Spouse name: N/A  . Number of children: N/A  . Years of education: N/A   Occupational History  . RETIRED    Social History Main Topics  . Smoking status: Former Smoker    Packs/day: 1.00    Years: 45.00    Types: Cigarettes    Quit date: 06/14/1995  . Smokeless tobacco: Never Used  . Alcohol use No  . Drug use: No  . Sexual activity: Not Asked   Other Topics Concern  . None   Social History Narrative  . None     Family History:  The patient's family history includes Coronary artery disease in his other; Heart attack in his mother.   ROS:   Please see the history of present illness.    His appetite is relatively stable. He complains of  difficulty with balance. He ambulates with a walker.  All other systems reviewed and are negative.   PHYSICAL EXAM:   VS:  BP 110/64   Pulse 62   Ht 6' (1.829 m)   Wt 199 lb 6.4 oz (90.4 kg)   BMI 27.04 kg/m    GEN: Well nourished, well developed, in no acute distress  HEENT: normal  Neck: no JVD, carotid bruits, or masses Cardiac: RRR; no murmurs, rubs, or gallops,no edema  Respiratory:  clear to auscultation bilaterally, normal work of breathing GI: soft, nontender, nondistended, + BS MS: no deformity or atrophy  Skin: warm and dry, no rash Neuro:  Alert and Oriented x 3, Strength and sensation are intact Psych: euthymic mood, full affect  Wt Readings from Last 3 Encounters:  03/15/16 199 lb 6.4  oz (90.4 kg)  06/17/15 193 lb 9.6 oz (87.8 kg)  06/02/15 192 lb 12.8 oz (87.5 kg)      Studies/Labs Reviewed:   EKG:  EKG  AV sequential pacing. Occasional premature ventricular contraction.  Recent Labs: 06/02/2015: ALT 12; Brain Natriuretic Peptide 352.6; BUN 26; Creat 1.53; Potassium 4.3; Sodium 141   Lipid Panel No results found for: CHOL, TRIG, HDL, CHOLHDL, VLDL, LDLCALC, LDLDIRECT  Additional studies/ records that were reviewed today include:   Echocardiogram January 2017: Study Conclusions   - Left ventricle: Wall thickness was increased in a pattern of   moderate LVH. There was moderate concentric hypertrophy. Systolic   function was moderately to severely reduced. The estimated   ejection fraction was in the range of 30% to 35%. Diffuse   hypokinesis. There is akinesis of the inferolateral and   inferoseptal myocardium. - Aortic valve: Moderately thickened, moderately calcified   leaflets. - Mitral valve: There was mild regurgitation. - Left atrium: The atrium was mildly dilated. - Right ventricle: Pacer wire or catheter noted in right ventricle. - Pulmonary arteries: Systolic pressure was mildly increased. PA   peak pressure: 32 mm Hg (S).    Impressions:   - Compared to the prior study, there has been no significant   interval change.    ASSESSMENT:    1. Non-ischemic cardiomyopathy-EF 25-30%   2. LBBB (left bundle branch block)   3. Chronic systolic heart failure ([place])   4. Coronary artery disease involving native coronary artery without angina pectoris, unspecified whether native or transplanted heart   5. Atrial fibrillation with RVR- DCCV 06/03/14   6. Status post biventricular cardiac pacemaker procedure   7. On amiodarone therapy      PLAN:  In order of problems listed above:  1. LVEF is stable to slightly improved compared with initial contact. The most recent echo demonstrated EF in the 30 to for 35% range. No evidence of volume overload. In fact with his flat neck veins, I wonder about fine contraction. A basic metabolic panel and BNP will be obtained today. Perhaps adjustment in diuretic regimen will occur. This is especially important since his primary physician has started Florinef. 2. Not addressed 3. As mentioned above no evidence of volume overload 4. No angina or other symptoms to suggest myocardial ischemia 5. Currently maintaining normal rhythm with AV sequential pacing post-cardioversion of atrial fibrillation with RVR in December 2015. 6. When last evaluated by Dr. Caryl Comes, biventricular pacemaker device was functioning normally. 7. Amiodarone therapy has been discontinued.    Medication Adjustments/Labs and Tests Ordered: Current medicines are reviewed at length with the patient today.  Concerns regarding medicines are outlined above.  Medication changes, Labs and Tests ordered today are listed in the Patient Instructions below. Patient Instructions  Medication Instructions:  Your physician recommends that you continue on your current medications as directed. Please refer to the Current Medication list given to you today.   Labwork: Lab work to be done today--BMP,  BNP  Testing/Procedures: none  Follow-Up: Your physician wants you to follow-up in: 6-9 months.  You will receive a reminder letter in the mail two months in advance. If you don't receive a letter, please call our office to schedule the follow-up appointment.   Any Other Special Instructions Will Be Listed Below (If Applicable).     If you need a refill on your cardiac medications before your next appointment, please call your pharmacy.      Signed, Sheldon,  MD  03/15/2016 5:15 PM    [address], [place], [place]  [address] Phone: [phone]; Fax: [phone]

## 2016-03-15 NOTE — Patient Instructions (Signed)
Medication Instructions:  Your physician recommends that you continue on your current medications as directed. Please refer to the Current Medication list given to you today.   Labwork: Lab work to be done today--BMP, BNP  Testing/Procedures: none  Follow-Up: Your physician wants you to follow-up in: 6-9 months.  You will receive a reminder letter in the mail two months in advance. If you don't receive a letter, please call our office to schedule the follow-up appointment.   Any Other Special Instructions Will Be Listed Below (If Applicable).     If you need a refill on your cardiac medications before your next appointment, please call your pharmacy.

## 2016-03-16 ENCOUNTER — Ambulatory Visit (INDEPENDENT_AMBULATORY_CARE_PROVIDER_SITE_OTHER): Payer: Medicare Other | Admitting: *Deleted

## 2016-03-16 DIAGNOSIS — I428 Other cardiomyopathies: Secondary | ICD-10-CM

## 2016-03-16 LAB — BRAIN NATRIURETIC PEPTIDE: BRAIN NATRIURETIC PEPTIDE: 476.2 pg/mL — AB (ref <100)

## 2016-03-16 NOTE — Progress Notes (Signed)
Remote ICD transmission.   

## 2016-03-18 ENCOUNTER — Encounter: Payer: Self-pay | Admitting: Cardiology

## 2016-03-25 LAB — CUP PACEART REMOTE DEVICE CHECK
Brady Statistic AP VS Percent: 0.09 %
Brady Statistic AS VP Percent: 68.23 %
Brady Statistic RA Percent Paced: 28.66 %
Brady Statistic RV Percent Paced: 96.81 %
Date Time Interrogation Session: 20171004113831
HighPow Impedance: 361 Ohm
HighPow Impedance: 43 Ohm
HighPow Impedance: 52 Ohm
Implantable Lead Implant Date: 20120827
Implantable Lead Location: 753858
Implantable Lead Location: 753859
Implantable Lead Location: 753860
Implantable Lead Model: 5076
Lead Channel Impedance Value: 304 Ohm
Lead Channel Impedance Value: 361 Ohm
Lead Channel Impedance Value: 665 Ohm
Lead Channel Pacing Threshold Amplitude: 0.625 V
Lead Channel Pacing Threshold Pulse Width: 0.4 ms
Lead Channel Pacing Threshold Pulse Width: 0.6 ms
Lead Channel Sensing Intrinsic Amplitude: 1.125 mV
Lead Channel Sensing Intrinsic Amplitude: 1.125 mV
Lead Channel Setting Pacing Amplitude: 2 V
Lead Channel Setting Pacing Pulse Width: 0.6 ms
Lead Channel Setting Sensing Sensitivity: 0.3 mV
MDC IDC LEAD IMPLANT DT: 20120827
MDC IDC LEAD IMPLANT DT: 20120827
MDC IDC LEAD MODEL: 4194
MDC IDC MSMT BATTERY VOLTAGE: 2.75 V
MDC IDC MSMT LEADCHNL LV IMPEDANCE VALUE: 513 Ohm
MDC IDC MSMT LEADCHNL LV PACING THRESHOLD AMPLITUDE: 0.5 V
MDC IDC MSMT LEADCHNL RV IMPEDANCE VALUE: 456 Ohm
MDC IDC MSMT LEADCHNL RV PACING THRESHOLD AMPLITUDE: 0.875 V
MDC IDC MSMT LEADCHNL RV PACING THRESHOLD PULSEWIDTH: 0.4 ms
MDC IDC MSMT LEADCHNL RV SENSING INTR AMPL: 11.75 mV
MDC IDC MSMT LEADCHNL RV SENSING INTR AMPL: 11.75 mV
MDC IDC SET LEADCHNL LV PACING AMPLITUDE: 1.25 V
MDC IDC SET LEADCHNL RV PACING AMPLITUDE: 2 V
MDC IDC SET LEADCHNL RV PACING PULSEWIDTH: 0.4 ms
MDC IDC STAT BRADY AP VP PERCENT: 28.58 %
MDC IDC STAT BRADY AS VS PERCENT: 3.1 %

## 2016-05-26 ENCOUNTER — Telehealth: Payer: Self-pay | Admitting: Interventional Cardiology

## 2016-05-26 NOTE — Telephone Encounter (Signed)
Pt c/o swelling: STAT is pt has developed SOB within 24 hours  1. How long have you been experiencing swelling? week 2. Where is the swelling located? Lower legs ankles and feet  3.  Are you currently taking a "fluid pill"? yes  4.  Are you currently SOB? no  5.  Have you traveled recently? no

## 2016-05-26 NOTE — Telephone Encounter (Signed)
Increase furosemide to 80 mg twice a day for a total of 3 doses then back to 40 mg twice a day.

## 2016-05-26 NOTE — Telephone Encounter (Addendum)
Spoke with pt's daughter, [name].  She states for the past week she has noticed that pt has had more swelling in bilateral LE.  Denies pain or redness.  Denies improvement with elevation.  Daughter states pt has been taking in more sodium due to eating a lot of boxed things like chicken and shrimp.  Pt currently taking Lasix 40mg  BID.  Advised daughter to have pt cut back on the amount of sodium he is taking in and I will send this message to Dr. Tamala Julian for review and advisement.

## 2016-05-26 NOTE — Telephone Encounter (Signed)
Spoke with daughter and reviewed recommendations per Dr. Tamala Julian.  Advised daughter to call back Monday if no improvement.  Daughter verbalized understanding and was in agreement with this plan.

## 2016-05-31 ENCOUNTER — Telehealth: Payer: Self-pay | Admitting: Interventional Cardiology

## 2016-05-31 ENCOUNTER — Telehealth: Payer: Self-pay

## 2016-05-31 ENCOUNTER — Ambulatory Visit (INDEPENDENT_AMBULATORY_CARE_PROVIDER_SITE_OTHER): Payer: Medicare Other | Admitting: Physician Assistant

## 2016-05-31 ENCOUNTER — Encounter: Payer: Self-pay | Admitting: Physician Assistant

## 2016-05-31 ENCOUNTER — Encounter (INDEPENDENT_AMBULATORY_CARE_PROVIDER_SITE_OTHER): Payer: Self-pay

## 2016-05-31 VITALS — BP 120/62 | HR 66 | Ht 72.0 in | Wt 204.0 lb

## 2016-05-31 DIAGNOSIS — I428 Other cardiomyopathies: Secondary | ICD-10-CM

## 2016-05-31 DIAGNOSIS — Z95810 Presence of automatic (implantable) cardiac defibrillator: Secondary | ICD-10-CM

## 2016-05-31 DIAGNOSIS — I5022 Chronic systolic (congestive) heart failure: Secondary | ICD-10-CM

## 2016-05-31 DIAGNOSIS — R600 Localized edema: Secondary | ICD-10-CM | POA: Diagnosis not present

## 2016-05-31 DIAGNOSIS — L03119 Cellulitis of unspecified part of limb: Secondary | ICD-10-CM

## 2016-05-31 DIAGNOSIS — Z950 Presence of cardiac pacemaker: Secondary | ICD-10-CM

## 2016-05-31 DIAGNOSIS — I10 Essential (primary) hypertension: Secondary | ICD-10-CM

## 2016-05-31 DIAGNOSIS — I2510 Atherosclerotic heart disease of native coronary artery without angina pectoris: Secondary | ICD-10-CM

## 2016-05-31 MED ORDER — FUROSEMIDE 40 MG PO TABS: ORAL_TABLET | ORAL | 11 refills | Status: DC

## 2016-05-31 MED ORDER — CEPHALEXIN 500 MG PO CAPS: 500.0000 mg | ORAL_CAPSULE | Freq: Two times a day (BID) | ORAL | 0 refills | Status: DC

## 2016-05-31 MED ORDER — METOLAZONE 2.5 MG PO TABS: ORAL_TABLET | ORAL | 0 refills | Status: DC

## 2016-05-31 NOTE — Telephone Encounter (Signed)
Spoke with daughter and scheduled pt to see Bonney Leitz, PA at 1:30p.  Daughter appreciative for assistance.

## 2016-05-31 NOTE — Telephone Encounter (Signed)
Received call from son in law.  He asked if the transmission came through and informed him it did.  Advised will send information to Nell Range, PA provider that patient will see today.

## 2016-05-31 NOTE — Telephone Encounter (Signed)
Thomas [name] is calling because Thomas [name] is having swelling in his feet ,ankles, and legs. They are weeping as well . He has gained 6 lbs in just a few weeks . Would like to come in today for a visit . Please call   Thanks

## 2016-05-31 NOTE — Progress Notes (Signed)
Cardiology Office Note    Date:  05/31/2016   ID:  Thomas [name], DOB Jan 24, 1934, MRN [id number]  PCP:  Tommye Standard, MD  Cardiologist:  Dr. Tamala Julian / Dr. Caryl Comes (EP)  CC: weeping LE edema   History of Present Illness:  Thomas [name] is a 82 y.o. male with a history of chronic systolic heart failure (EF 30-35%), CAD with prior CHIP LAD and circumflex within Impela support, orthostatic hypotension on Florinef, history of atrial fibrillation, Parkinson's disease, HTN, hyperthyroidism, and polymorphic VT s/p CRT-D (2012) who presents to clinic for evaluation of worsening LE edema.   Review of recent phone notes show that his wife called the office on 05/26/16 to report worsening LE edema 2/2 increased salt intake. His lasix was increased from 40 mg BID to 80mg  BID x3 days and then back to 40 mg BID. They called the office again today and said his weight is up 6 lbs and now has weeping LE edema.   Added onto my clinic schedule for further evaluation. I had his optival interrogated which showed no afib. Impedance is near baseline. No CP or worsening SOB. No orthopnea or PND. No dizziness or syncope. No blood in stool or urine. No palpitations. His LE edema has stayed the same and had some weeping. Also legs are very red and hot. No pain. No fevers or chills.     .  Past Medical History:  Diagnosis Date  . Coronary artery disease    80% distal RCA stenosis.  . Hypertension   . Hypertrophy of prostate without urinary obstruction and other lower urinary tract symptoms (LUTS)    BPH  . Nontoxic multinodular goiter   . Parkinson's disease   . Psychosexual dysfunction with inhibited sexual excitement    ERECTILE DYSFUNCTION  . Status post biventricular cardiac pacemaker insertion    Status post CRT-D.  Marland Kitchen Thyroid disease    HYPOTHYROIDISM  . Wide-complex tachycardia ([place])     Past Surgical History:  Procedure Laterality Date  . ABDOMINAL ANGIOGRAM  06/10/2014   Procedure: ABDOMINAL  ANGIOGRAM;  Surgeon: Sinclair Grooms, MD;  Location: [facility] CATH LAB;  Service: Cardiovascular;;  . CARDIAC CATHETERIZATION    . CARDIAC CATHETERIZATION  06/12/2014   Procedure: CORONARY STENT INTERVENTION W/IMPELLA;  Surgeon: Sinclair Grooms, MD;  Location: [facility] CATH LAB;  Service: Cardiovascular;;  . CARDIOVERSION N/A 06/03/2014   Procedure: CARDIOVERSION;  Surgeon: Thompson Grayer, MD;  Location: [facility] CATH LAB;  Service: Cardiovascular;  Laterality: N/A;  . LEFT HEART CATHETERIZATION WITH CORONARY ANGIOGRAM N/A 06/10/2014   Procedure: LEFT HEART CATHETERIZATION WITH CORONARY ANGIOGRAM;  Surgeon: Sinclair Grooms, MD;  Location: [facility] CATH LAB;  Service: Cardiovascular;  Laterality: N/A;  . PERCUTANEOUS CORONARY ROTOBLATOR INTERVENTION (PCI-R)  06/12/2014   Procedure: PERCUTANEOUS CORONARY ROTOBLATOR INTERVENTION (PCI-R);  Surgeon: Sinclair Grooms, MD;  Location: [facility] CATH LAB;  Service: Cardiovascular;;  . PERCUTANEOUS CORONARY STENT INTERVENTION (PCI-S) N/A 06/12/2014   Procedure: PERCUTANEOUS CORONARY STENT INTERVENTION (PCI-S);  Surgeon: Sinclair Grooms, MD;  Location: [facility] CATH LAB;  Service: Cardiovascular;  Laterality: N/A;    Current Medications: Outpatient Medications Prior to Visit  Medication Sig Dispense Refill  . acetaminophen (TYLENOL) 325 MG tablet Take 650 mg by mouth every 4 (four) hours as needed for mild pain.    Marland Kitchen amantadine (SYMMETREL) 100 MG capsule Take 100 mg by mouth 2 (two) times daily.     Marland Kitchen aspirin EC 81 MG EC tablet Take 1  tablet (81 mg total) by mouth daily.    . carbidopa-levodopa (SINEMET IR) 25-100 MG per tablet Take 1 tablet by mouth 3 (three) times daily. 30 tablet 0  . clopidogrel (PLAVIX) 75 MG tablet Take 1 tablet (75 mg total) by mouth daily. 90 tablet 0  . escitalopram (LEXAPRO) 10 MG tablet Take 10 mg by mouth daily.    . Ferrous Sulfate (IRON) 325 (65 Fe) MG TABS Take 1 tablet by mouth daily.    . fludrocortisone (FLORINEF) 0.1 MG tablet Take 0.1 mg by mouth  daily three (3) times a week on Mondays, Wednesdays, and Fridays.    . furosemide (LASIX) 40 MG tablet Take 40 mg by mouth 2 (two) times daily.     . methimazole (TAPAZOLE) 10 MG tablet Take 1 tablet (10 mg total) by mouth daily. 30 tablet 0  . Multiple Vitamins-Minerals (ICAPS PO) Take 1 tablet by mouth 2 (two) times daily.    . nitroGLYCERIN (NITROSTAT) 0.4 MG SL tablet Place 0.4 mg under the tongue every 5 (five) minutes as needed for chest pain.     . Omega-3 Fatty Acids (FISH OIL) 1000 MG CAPS Take one by mouth twice daily      . OVER THE COUNTER MEDICATION Take 1 tablet by mouth 2 (two) times daily. Med Name: PROSVENT    . pantoprazole (PROTONIX) 20 MG tablet Take 20 mg by mouth daily.    . potassium chloride SA (K-DUR,KLOR-CON) 20 MEQ tablet Take 20 mEq by mouth daily.    . pramipexole (MIRAPEX) 0.5 MG tablet Take 0.5 mg by mouth 3 (three) times daily.    . predniSONE (DELTASONE) 10 MG tablet Take 10 mg by mouth every morning.    . rotigotine (NEUPRO) 6 MG/24HR Place 1 patch onto the skin daily.    Marland Kitchen ULORIC 40 MG tablet Take 40 mg by mouth daily.    . vitamin C (ASCORBIC ACID) 500 MG tablet Take 500 mg by mouth daily.     No facility-administered medications prior to visit.      Allergies:   Ciprofloxacin and Levaquin [levofloxacin]   Social History   Social History  . Marital status: Widowed    Spouse name: N/A  . Number of children: N/A  . Years of education: N/A   Occupational History  . RETIRED    Social History Main Topics  . Smoking status: Former Smoker    Packs/day: 1.00    Years: 45.00    Types: Cigarettes    Quit date: 06/14/1995  . Smokeless tobacco: Never Used  . Alcohol use No  . Drug use: No  . Sexual activity: Not Asked   Other Topics Concern  . None   Social History Narrative  . None     Family History:  The patient's family history includes Coronary artery disease in his other; Heart attack in his mother.     ROS:   Please see the history of  present illness.    ROS All other systems reviewed and are negative.   PHYSICAL EXAM:   VS:  BP 120/62   Pulse 66   Ht 6' (1.829 m)   Wt 204 lb (92.5 kg)   BMI 27.67 kg/m    GEN: Well nourished, well developed, in no acute distress  HEENT: normal  Neck: no JVD, carotid bruits, or masses Cardiac: RRR; no murmurs, rubs, or gallops 2+ LE edema to knees with erythema and rubor  Respiratory:  clear to auscultation bilaterally, normal work  of breathing GI: soft, nontender, nondistended, + BS MS: no deformity or atrophy  Skin: warm and dry, no rash Neuro:  Alert and Oriented x 3, Strength and sensation are intact Psych: euthymic mood, full affect  Wt Readings from Last 3 Encounters:  05/31/16 204 lb (92.5 kg)  03/15/16 199 lb 6.4 oz (90.4 kg)  06/17/15 193 lb 9.6 oz (87.8 kg)      Studies/Labs Reviewed:   EKG:  EKG is NOT ordered today.    Recent Labs: 06/02/2015: ALT 12 03/15/2016: Brain Natriuretic Peptide 476.2; BUN 23; Creat 1.51; Potassium 4.3; Sodium 142   Lipid Panel No results found for: CHOL, TRIG, HDL, CHOLHDL, VLDL, LDLCALC, LDLDIRECT  Additional studies/ records that were reviewed today include:  2D ECHO: 06/17/2015 LV EF: 30% -   35% Study Conclusions - Left ventricle: Wall thickness was increased in a pattern of   moderate LVH. There was moderate concentric hypertrophy. Systolic   function was moderately to severely reduced. The estimated   ejection fraction was in the range of 30% to 35%. Diffuse   hypokinesis. There is akinesis of the inferolateral and   inferoseptal myocardium. - Aortic valve: Moderately thickened, moderately calcified   leaflets. - Mitral valve: There was mild regurgitation. - Left atrium: The atrium was mildly dilated. - Right ventricle: Pacer wire or catheter noted in right ventricle. - Pulmonary arteries: Systolic pressure was mildly increased. PA   peak pressure: 32 mm Hg (S). Impressions: - Compared to the prior study, there  has been no significant   interval change.   ASSESSMENT & PLAN:   Loghan Markel is a 82 y.o. male with a history of chronic systolic heart failure (EF 30-35%), coronary artery disease with prior CHIP LAD and circumflex within Impela support, orthostatic hypotension on Florinef, history of atrial fibrillation, Parkinson's disease, HTN, hyperthyroidism, and polymorphic VT s/p CRT-D (2012) who presents to clinic for evaluation of worsening LE edema.   Chronic systolic CHF s/p BiV ICD: actually appears euvolemic besides LE edema.  I had his optival interrogated which showed no afib and impedance near baseline. See below.   LE edema/cellulitis: worsening LE edema despite increase Lasix from 40mg  BID--> 80 mg BID.  Dr. Tamala Julian saw patient with me in the office who recommended metolazone 2.5mg  30 minutes before lasix 80mg  tomorrow AM followed by lasix 40mg  BID. He will take another metolazone 2.5mg  30 minutes before lasix 40mg  the following day as well. Will check a BMET today and start Keflex 500mg  BID x 7days.   CAD: continue ASA/plavix. No chest pain  PAF: interrogated his device which shows no afib. Not on chronic OAC  Orthostatic hypotension: on florinef  Parkinsons dz: continue Sinemet  HTN: BP well controlled currently  Polymorphic VT s/p CRT-D (2012): followed by Dr. Caryl Comes    Medication Adjustments/Labs and Tests Ordered: Current medicines are reviewed at length with the patient today.  Concerns regarding medicines are outlined above.  Medication changes, Labs and Tests ordered today are listed in the Patient Instructions below. Patient Instructions  Medication Instructions:  Your physician has recommended you make the following change in your medication:    Labwork: TODAY:  BNP   Testing/Procedures: None ordered  Follow-Up: Your physician recommends that you schedule a follow-up appointment in:    Any Other Special Instructions Will Be Listed Below (If  Applicable).     If you need a refill on your cardiac medications before your next appointment, please call your pharmacy.  Signed, Angelena Form, PA-C  05/31/2016 2:10 PM    [facility] [address], [place], [place]  [address] Phone: [phone]; Fax: [phone]

## 2016-05-31 NOTE — Patient Instructions (Addendum)
Medication Instructions:  Your physician has recommended you make the following change in your medication:  1.  START Metolazone 2.5 mg taking 30 minutes before you take the Lasix X's 2 days 2.  TAKE 2 tablets of Lasix tomorrow morning and 1 tomorrow afternoon then go back to 1 tablet twice a day 3.  START the Keflex 500 mg taking 1 tablet twice a day X's 7 days  Labwork: TODAY:  BNP & BMP  Testing/Procedures: None ordered  Follow-Up: Your physician recommends that you schedule a follow-up appointment in: [place], NP  Any Other Special Instructions Will Be Listed Below (If Applicable).    If you need a refill on your cardiac medications before your next appointment, please call your pharmacy.

## 2016-05-31 NOTE — Telephone Encounter (Signed)
Received request from Nell Range, PA to obtain remote transmission before patients appointment today at 1:30pm.  Call to daughter, Vito Backers (DPR on file) and requested to send remote transmission from patients device for the PA to review at the appointment today.  She stated her husband will send one in the next 30 minutes.  Explained fluid levels can be monitored by device and also if patient is having any arrhythmias.  Provided direct ICM number and advised to call back if any assistance is needed in sending a remote.

## 2016-06-01 ENCOUNTER — Telehealth: Payer: Self-pay | Admitting: *Deleted

## 2016-06-01 DIAGNOSIS — Z79899 Other long term (current) drug therapy: Secondary | ICD-10-CM

## 2016-06-01 LAB — BRAIN NATRIURETIC PEPTIDE: BRAIN NATRIURETIC PEPTIDE: 219.7 pg/mL — AB (ref <100)

## 2016-06-01 LAB — BASIC METABOLIC PANEL
BUN: 27 mg/dL — AB (ref 7–25)
CHLORIDE: 101 mmol/L (ref 98–110)
CO2: 31 mmol/L (ref 20–31)
CREATININE: 1.78 mg/dL — AB (ref 0.70–1.11)
Calcium: 8.8 mg/dL (ref 8.6–10.3)
Glucose, Bld: 101 mg/dL — ABNORMAL HIGH (ref 65–99)
POTASSIUM: 3.8 mmol/L (ref 3.5–5.3)
Sodium: 141 mmol/L (ref 135–146)

## 2016-06-01 NOTE — Telephone Encounter (Signed)
Pt daughter, [name], [place] on file, has been made aware of pts lab results. She will have him back for repeat BMET on Friday, 06/03/16.

## 2016-06-03 ENCOUNTER — Other Ambulatory Visit: Payer: Medicare Other | Admitting: *Deleted

## 2016-06-03 DIAGNOSIS — Z79899 Other long term (current) drug therapy: Secondary | ICD-10-CM

## 2016-06-03 LAB — BASIC METABOLIC PANEL
BUN: 27 mg/dL — AB (ref 7–25)
CO2: 33 mmol/L — ABNORMAL HIGH (ref 20–31)
CREATININE: 1.63 mg/dL — AB (ref 0.70–1.11)
Calcium: 9 mg/dL (ref 8.6–10.3)
Chloride: 99 mmol/L (ref 98–110)
GLUCOSE: 67 mg/dL (ref 65–99)
POTASSIUM: 3.8 mmol/L (ref 3.5–5.3)
Sodium: 142 mmol/L (ref 135–146)

## 2016-06-15 ENCOUNTER — Encounter (HOSPITAL_COMMUNITY): Payer: Self-pay | Admitting: General Practice

## 2016-06-15 ENCOUNTER — Inpatient Hospital Stay (HOSPITAL_COMMUNITY): Payer: Medicare Other

## 2016-06-15 ENCOUNTER — Inpatient Hospital Stay (HOSPITAL_COMMUNITY)
Admission: AD | Admit: 2016-06-15 | Discharge: 2016-06-21 | DRG: 291 | Disposition: A | Discharge status: Home Health | Payer: Medicare Other | Source: Ambulatory Visit | Attending: Interventional Cardiology | Admitting: Interventional Cardiology

## 2016-06-15 ENCOUNTER — Encounter: Payer: Self-pay | Admitting: Cardiology

## 2016-06-15 ENCOUNTER — Ambulatory Visit (INDEPENDENT_AMBULATORY_CARE_PROVIDER_SITE_OTHER): Payer: Medicare Other | Admitting: Cardiology

## 2016-06-15 VITALS — BP 108/60 | HR 72 | Ht 72.0 in | Wt 211.0 lb

## 2016-06-15 DIAGNOSIS — I2510 Atherosclerotic heart disease of native coronary artery without angina pectoris: Secondary | ICD-10-CM

## 2016-06-15 DIAGNOSIS — I252 Old myocardial infarction: Secondary | ICD-10-CM | POA: Diagnosis not present

## 2016-06-15 DIAGNOSIS — G20A1 Parkinson's disease without dyskinesia, without mention of fluctuations: Secondary | ICD-10-CM | POA: Diagnosis present

## 2016-06-15 DIAGNOSIS — N179 Acute kidney failure, unspecified: Secondary | ICD-10-CM | POA: Diagnosis present

## 2016-06-15 DIAGNOSIS — G20 Parkinson's disease: Secondary | ICD-10-CM | POA: Diagnosis present

## 2016-06-15 DIAGNOSIS — E876 Hypokalemia: Secondary | ICD-10-CM | POA: Diagnosis present

## 2016-06-15 DIAGNOSIS — Z7902 Long term (current) use of antithrombotics/antiplatelets: Secondary | ICD-10-CM | POA: Diagnosis not present

## 2016-06-15 DIAGNOSIS — I130 Hypertensive heart and chronic kidney disease with heart failure and stage 1 through stage 4 chronic kidney disease, or unspecified chronic kidney disease: Principal | ICD-10-CM | POA: Diagnosis present

## 2016-06-15 DIAGNOSIS — R0602 Shortness of breath: Secondary | ICD-10-CM

## 2016-06-15 DIAGNOSIS — Z7952 Long term (current) use of systemic steroids: Secondary | ICD-10-CM

## 2016-06-15 DIAGNOSIS — Z950 Presence of cardiac pacemaker: Secondary | ICD-10-CM | POA: Diagnosis not present

## 2016-06-15 DIAGNOSIS — Z7982 Long term (current) use of aspirin: Secondary | ICD-10-CM | POA: Diagnosis not present

## 2016-06-15 DIAGNOSIS — I5021 Acute systolic (congestive) heart failure: Secondary | ICD-10-CM

## 2016-06-15 DIAGNOSIS — Z955 Presence of coronary angioplasty implant and graft: Secondary | ICD-10-CM | POA: Diagnosis not present

## 2016-06-15 DIAGNOSIS — I428 Other cardiomyopathies: Secondary | ICD-10-CM

## 2016-06-15 DIAGNOSIS — R600 Localized edema: Secondary | ICD-10-CM

## 2016-06-15 DIAGNOSIS — G903 Multi-system degeneration of the autonomic nervous system: Secondary | ICD-10-CM | POA: Diagnosis present

## 2016-06-15 DIAGNOSIS — N183 Chronic kidney disease, stage 3 unspecified: Secondary | ICD-10-CM | POA: Diagnosis present

## 2016-06-15 DIAGNOSIS — Z79899 Other long term (current) drug therapy: Secondary | ICD-10-CM | POA: Diagnosis not present

## 2016-06-15 DIAGNOSIS — I10 Essential (primary) hypertension: Secondary | ICD-10-CM

## 2016-06-15 DIAGNOSIS — I480 Paroxysmal atrial fibrillation: Secondary | ICD-10-CM | POA: Diagnosis present

## 2016-06-15 DIAGNOSIS — I5043 Acute on chronic combined systolic (congestive) and diastolic (congestive) heart failure: Secondary | ICD-10-CM | POA: Diagnosis present

## 2016-06-15 DIAGNOSIS — I5023 Acute on chronic systolic (congestive) heart failure: Secondary | ICD-10-CM | POA: Diagnosis present

## 2016-06-15 DIAGNOSIS — Z881 Allergy status to other antibiotic agents status: Secondary | ICD-10-CM

## 2016-06-15 DIAGNOSIS — Z8249 Family history of ischemic heart disease and other diseases of the circulatory system: Secondary | ICD-10-CM | POA: Diagnosis not present

## 2016-06-15 DIAGNOSIS — E0520 Thyrotoxicosis with toxic multinodular goiter without thyrotoxic crisis or storm: Secondary | ICD-10-CM | POA: Diagnosis present

## 2016-06-15 DIAGNOSIS — I951 Orthostatic hypotension: Secondary | ICD-10-CM

## 2016-06-15 DIAGNOSIS — I509 Heart failure, unspecified: Secondary | ICD-10-CM | POA: Diagnosis not present

## 2016-06-15 DIAGNOSIS — I110 Hypertensive heart disease with heart failure: Secondary | ICD-10-CM

## 2016-06-15 DIAGNOSIS — I429 Cardiomyopathy, unspecified: Secondary | ICD-10-CM | POA: Diagnosis present

## 2016-06-15 DIAGNOSIS — Z9181 History of falling: Secondary | ICD-10-CM

## 2016-06-15 DIAGNOSIS — L03119 Cellulitis of unspecified part of limb: Secondary | ICD-10-CM

## 2016-06-15 HISTORY — DX: Presence of cardiac pacemaker: Z95.0

## 2016-06-15 HISTORY — DX: Acute myocardial infarction, unspecified: I21.9

## 2016-06-15 HISTORY — DX: Unspecified convulsions: R56.9

## 2016-06-15 HISTORY — DX: Personal history of other diseases of the musculoskeletal system and connective tissue: Z87.39

## 2016-06-15 HISTORY — DX: Gastro-esophageal reflux disease without esophagitis: K21.9

## 2016-06-15 HISTORY — DX: Auditory hallucinations: R44.0

## 2016-06-15 HISTORY — DX: Heart failure, unspecified: I50.9

## 2016-06-15 HISTORY — DX: Unspecified malignant neoplasm of skin, unspecified: C44.90

## 2016-06-15 HISTORY — DX: Major depressive disorder, single episode, unspecified: F32.9

## 2016-06-15 HISTORY — DX: Depression, unspecified: F32.A

## 2016-06-15 HISTORY — DX: Presence of automatic (implantable) cardiac defibrillator: Z95.810

## 2016-06-15 LAB — TROPONIN I: Troponin I: 0.03 ng/mL (ref <0.03)

## 2016-06-15 LAB — CBC WITH DIFFERENTIAL/PLATELET
Basophils Absolute: 0 10*3/uL (ref 0.0–0.1)
Basophils Relative: 1 %
Eosinophils Absolute: 0.3 10*3/uL (ref 0.0–0.7)
Eosinophils Relative: 6 %
HEMATOCRIT: 35.7 % — AB (ref 39.0–52.0)
Hemoglobin: 11.3 g/dL — ABNORMAL LOW (ref 13.0–17.0)
LYMPHS ABS: 0.9 10*3/uL (ref 0.7–4.0)
LYMPHS PCT: 19 %
MCH: 28.8 pg (ref 26.0–34.0)
MCHC: 31.7 g/dL (ref 30.0–36.0)
MCV: 91.1 fL (ref 78.0–100.0)
Monocytes Absolute: 0.4 10*3/uL (ref 0.1–1.0)
Monocytes Relative: 8 %
NEUTROS ABS: 3.2 10*3/uL (ref 1.7–7.7)
NEUTROS PCT: 66 %
Platelets: 144 10*3/uL — ABNORMAL LOW (ref 150–400)
RBC: 3.92 MIL/uL — AB (ref 4.22–5.81)
RDW: 13.3 % (ref 11.5–15.5)
WBC: 4.9 10*3/uL (ref 4.0–10.5)

## 2016-06-15 LAB — COMPREHENSIVE METABOLIC PANEL
ALK PHOS: 78 U/L (ref 38–126)
ALT: 11 U/L — ABNORMAL LOW (ref 17–63)
AST: 20 U/L (ref 15–41)
Albumin: 3.6 g/dL (ref 3.5–5.0)
Anion gap: 8 (ref 5–15)
BUN: 20 mg/dL (ref 6–20)
CALCIUM: 8.8 mg/dL — AB (ref 8.9–10.3)
CO2: 30 mmol/L (ref 22–32)
Chloride: 104 mmol/L (ref 101–111)
Creatinine, Ser: 1.72 mg/dL — ABNORMAL HIGH (ref 0.61–1.24)
GFR, EST AFRICAN AMERICAN: 41 mL/min — AB (ref >60)
GFR, EST NON AFRICAN AMERICAN: 36 mL/min — AB (ref >60)
Glucose, Bld: 88 mg/dL (ref 65–99)
Potassium: 4 mmol/L (ref 3.5–5.1)
SODIUM: 142 mmol/L (ref 135–145)
Total Bilirubin: 0.7 mg/dL (ref 0.3–1.2)
Total Protein: 6 g/dL — ABNORMAL LOW (ref 6.5–8.1)

## 2016-06-15 LAB — PROTIME-INR
INR: 1.03
PROTHROMBIN TIME: 13.5 s (ref 11.4–15.2)

## 2016-06-15 LAB — APTT: aPTT: 30 seconds (ref 24–36)

## 2016-06-15 LAB — BRAIN NATRIURETIC PEPTIDE: B Natriuretic Peptide: 532.4 pg/mL — ABNORMAL HIGH (ref 0.0–100.0)

## 2016-06-15 LAB — MAGNESIUM: Magnesium: 2.7 mg/dL — ABNORMAL HIGH (ref 1.7–2.4)

## 2016-06-15 MED ORDER — ASPIRIN EC 81 MG PO TBEC
81.0000 mg | DELAYED_RELEASE_TABLET | Freq: Every day | ORAL | Status: DC
  Administered 2016-06-16 – 2016-06-21 (×6): 81 mg via ORAL
  Filled 2016-06-15 (×6): qty 1

## 2016-06-15 MED ORDER — FEBUXOSTAT 40 MG PO TABS
40.0000 mg | ORAL_TABLET | Freq: Every day | ORAL | Status: DC
  Administered 2016-06-16 – 2016-06-21 (×6): 40 mg via ORAL
  Filled 2016-06-15 (×6): qty 1

## 2016-06-15 MED ORDER — HEPARIN SODIUM (PORCINE) 5000 UNIT/ML IJ SOLN
5000.0000 [IU] | Freq: Three times a day (TID) | INTRAMUSCULAR | Status: DC
  Administered 2016-06-15 – 2016-06-21 (×19): 5000 [IU] via SUBCUTANEOUS
  Filled 2016-06-15 (×19): qty 1

## 2016-06-15 MED ORDER — IPRATROPIUM-ALBUTEROL 0.5-2.5 (3) MG/3ML IN SOLN: 3.0000 mL | RESPIRATORY_TRACT | Status: DC | PRN

## 2016-06-15 MED ORDER — POTASSIUM CHLORIDE CRYS ER 20 MEQ PO TBCR
20.0000 meq | EXTENDED_RELEASE_TABLET | Freq: Every day | ORAL | Status: DC
  Administered 2016-06-16 – 2016-06-17 (×2): 20 meq via ORAL
  Filled 2016-06-15 (×2): qty 1

## 2016-06-15 MED ORDER — ACETAMINOPHEN 325 MG PO TABS
650.0000 mg | ORAL_TABLET | ORAL | Status: DC | PRN
  Administered 2016-06-17 (×2): 650 mg via ORAL
  Filled 2016-06-15 (×2): qty 2

## 2016-06-15 MED ORDER — METHIMAZOLE 5 MG PO TABS
10.0000 mg | ORAL_TABLET | Freq: Every day | ORAL | Status: DC
  Administered 2016-06-16 – 2016-06-21 (×6): 10 mg via ORAL
  Filled 2016-06-15 (×6): qty 2

## 2016-06-15 MED ORDER — SODIUM CHLORIDE 0.9% FLUSH
3.0000 mL | Freq: Two times a day (BID) | INTRAVENOUS | Status: DC
  Administered 2016-06-15 – 2016-06-21 (×10): 3 mL via INTRAVENOUS

## 2016-06-15 MED ORDER — CARBIDOPA-LEVODOPA 25-100 MG PO TABS
1.0000 | ORAL_TABLET | Freq: Three times a day (TID) | ORAL | Status: DC
  Administered 2016-06-16 – 2016-06-21 (×18): 1 via ORAL
  Filled 2016-06-15 (×18): qty 1

## 2016-06-15 MED ORDER — PRAMIPEXOLE DIHYDROCHLORIDE 0.25 MG PO TABS
0.5000 mg | ORAL_TABLET | Freq: Three times a day (TID) | ORAL | Status: DC
  Administered 2016-06-16 – 2016-06-21 (×18): 0.5 mg via ORAL
  Filled 2016-06-15 (×18): qty 2

## 2016-06-15 MED ORDER — ROTIGOTINE 6 MG/24HR TD PT24: 1.0000 | MEDICATED_PATCH | Freq: Every day | TRANSDERMAL | Status: DC

## 2016-06-15 MED ORDER — FERROUS SULFATE 325 (65 FE) MG PO TABS
325.0000 mg | ORAL_TABLET | Freq: Every day | ORAL | Status: DC
  Administered 2016-06-16 – 2016-06-21 (×6): 325 mg via ORAL
  Filled 2016-06-15 (×6): qty 1

## 2016-06-15 MED ORDER — ASPIRIN 81 MG PO TBEC: 81.0000 mg | DELAYED_RELEASE_TABLET | Freq: Every day | ORAL | Status: DC

## 2016-06-15 MED ORDER — PREDNISONE 10 MG PO TABS
10.0000 mg | ORAL_TABLET | Freq: Every day | ORAL | Status: DC
  Administered 2016-06-16 – 2016-06-21 (×6): 10 mg via ORAL
  Filled 2016-06-15 (×6): qty 1

## 2016-06-15 MED ORDER — AMANTADINE HCL 100 MG PO CAPS
100.0000 mg | ORAL_CAPSULE | Freq: Two times a day (BID) | ORAL | Status: DC
  Administered 2016-06-16 – 2016-06-21 (×12): 100 mg via ORAL
  Filled 2016-06-15 (×15): qty 1

## 2016-06-15 MED ORDER — OMEGA-3-ACID ETHYL ESTERS 1 G PO CAPS
1.0000 g | ORAL_CAPSULE | Freq: Every day | ORAL | Status: DC
  Administered 2016-06-16 – 2016-06-21 (×6): 1 g via ORAL
  Filled 2016-06-15 (×6): qty 1

## 2016-06-15 MED ORDER — ONDANSETRON HCL 4 MG/2ML IJ SOLN: 4.0000 mg | Freq: Four times a day (QID) | INTRAMUSCULAR | Status: DC | PRN

## 2016-06-15 MED ORDER — SODIUM CHLORIDE 0.9% FLUSH: 3.0000 mL | INTRAVENOUS | Status: DC | PRN

## 2016-06-15 MED ORDER — CLOPIDOGREL BISULFATE 75 MG PO TABS
75.0000 mg | ORAL_TABLET | Freq: Every day | ORAL | Status: DC
  Administered 2016-06-16 – 2016-06-21 (×6): 75 mg via ORAL
  Filled 2016-06-15 (×6): qty 1

## 2016-06-15 MED ORDER — SODIUM CHLORIDE 0.9 % IV SOLN: 250.0000 mL | INTRAVENOUS | Status: DC | PRN

## 2016-06-15 MED ORDER — ESCITALOPRAM OXALATE 10 MG PO TABS
10.0000 mg | ORAL_TABLET | Freq: Every day | ORAL | Status: DC
  Administered 2016-06-16 – 2016-06-21 (×6): 10 mg via ORAL
  Filled 2016-06-15 (×6): qty 1

## 2016-06-15 MED ORDER — FUROSEMIDE 10 MG/ML IJ SOLN
80.0000 mg | Freq: Two times a day (BID) | INTRAMUSCULAR | Status: DC
  Administered 2016-06-15 – 2016-06-19 (×8): 80 mg via INTRAVENOUS
  Filled 2016-06-15 (×9): qty 8

## 2016-06-15 MED ORDER — FLUDROCORTISONE ACETATE 0.1 MG PO TABS
0.1000 mg | ORAL_TABLET | ORAL | Status: DC
  Administered 2016-06-17 – 2016-06-20 (×2): 0.1 mg via ORAL
  Filled 2016-06-15 (×2): qty 1

## 2016-06-15 MED ORDER — ACETAMINOPHEN 325 MG PO TABS: 650.0000 mg | ORAL_TABLET | ORAL | Status: DC | PRN

## 2016-06-15 MED ORDER — NITROGLYCERIN 0.4 MG SL SUBL: 0.4000 mg | SUBLINGUAL_TABLET | SUBLINGUAL | Status: DC | PRN

## 2016-06-15 MED ORDER — VITAMIN C 500 MG PO TABS
500.0000 mg | ORAL_TABLET | Freq: Every day | ORAL | Status: DC
  Administered 2016-06-16 – 2016-06-21 (×6): 500 mg via ORAL
  Filled 2016-06-15 (×6): qty 1

## 2016-06-15 NOTE — Progress Notes (Signed)
Cardiology Office Note   Date:  06/15/2016   ID:  Thomas [name], DOB 05/12/1934, MRN [id number]  PCP:  Tommye Standard, MD  Cardiologist:  Dr. Tamala Julian EP  Dr. Caryl Comes    Chief Complaint  Patient presents with  . Leg Swelling      History of Present Illness: Thomas [name] is a 83 y.o. male who presents for lower ext edema, seen 05/31/16.    He has a hx. a history of chronic systolic heart failure (EF 30-35%), CAD with prior CHIP LAD and circumflex within Impela support, orthostatic hypotension on Florinef, history of atrial fibrillation, Parkinson's disease, HTN, hyperthyroidism, and polymorphic VT s/p CRT-D (2012) who presents to clinic for evaluation of worsening LE edema.   On the visit on the 19th "optival interrogated which showed no afib. Impedance is near baseline. No CP or worsening SOB. No orthopnea or PND. No dizziness or syncope. No blood in stool or urine. No palpitations. His LE edema has stayed the same and had some weeping. Also legs are very red and hot. No pain. No fevers or chills. "    Dr. Tamala Julian saw patient with PA on the 19th and recommended metolazone 2.5mg  30 minutes before lasix 80mg  tomorrow AM followed by lasix 40mg  BID. He will take another metolazone 2.5mg  30 minutes before lasix 40mg  the following day as well.  Started Keflex 500mg  BID x 7days.   Labs improved.    Today wt continues to increase as does edema.  Now edema to knees bil and legs remain red.  He also has DOE that has increased since last appt. No chest pain.     Past Medical History:  Diagnosis Date  . Coronary artery disease    80% distal RCA stenosis.  . Hypertension   . Hypertrophy of prostate without urinary obstruction and other lower urinary tract symptoms (LUTS)    BPH  . Nontoxic multinodular goiter   . Parkinson's disease   . Psychosexual dysfunction with inhibited sexual excitement    ERECTILE DYSFUNCTION  . Status post biventricular cardiac pacemaker insertion    Status post  CRT-D.  Marland Kitchen Thyroid disease    HYPOTHYROIDISM  . Wide-complex tachycardia ([place])     Past Surgical History:  Procedure Laterality Date  . ABDOMINAL ANGIOGRAM  06/10/2014   Procedure: ABDOMINAL ANGIOGRAM;  Surgeon: Sinclair Grooms, MD;  Location: [facility] CATH LAB;  Service: Cardiovascular;;  . CARDIAC CATHETERIZATION    . CARDIAC CATHETERIZATION  06/12/2014   Procedure: CORONARY STENT INTERVENTION W/IMPELLA;  Surgeon: Sinclair Grooms, MD;  Location: [facility] CATH LAB;  Service: Cardiovascular;;  . CARDIOVERSION N/A 06/03/2014   Procedure: CARDIOVERSION;  Surgeon: Thompson Grayer, MD;  Location: [facility] CATH LAB;  Service: Cardiovascular;  Laterality: N/A;  . LEFT HEART CATHETERIZATION WITH CORONARY ANGIOGRAM N/A 06/10/2014   Procedure: LEFT HEART CATHETERIZATION WITH CORONARY ANGIOGRAM;  Surgeon: Sinclair Grooms, MD;  Location: [facility] CATH LAB;  Service: Cardiovascular;  Laterality: N/A;  . PERCUTANEOUS CORONARY ROTOBLATOR INTERVENTION (PCI-R)  06/12/2014   Procedure: PERCUTANEOUS CORONARY ROTOBLATOR INTERVENTION (PCI-R);  Surgeon: Sinclair Grooms, MD;  Location: [facility] CATH LAB;  Service: Cardiovascular;;  . PERCUTANEOUS CORONARY STENT INTERVENTION (PCI-S) N/A 06/12/2014   Procedure: PERCUTANEOUS CORONARY STENT INTERVENTION (PCI-S);  Surgeon: Sinclair Grooms, MD;  Location: [facility] CATH LAB;  Service: Cardiovascular;  Laterality: N/A;     Current Outpatient Prescriptions  Medication Sig Dispense Refill  . acetaminophen (TYLENOL) 325 MG tablet Take 650 mg by mouth every  4 (four) hours as needed for mild pain.    Marland Kitchen amantadine (SYMMETREL) 100 MG capsule Take 100 mg by mouth 2 (two) times daily.     Marland Kitchen aspirin EC 81 MG EC tablet Take 1 tablet (81 mg total) by mouth daily.    . carbidopa-levodopa (SINEMET IR) 25-100 MG per tablet Take 1 tablet by mouth 3 (three) times daily. 30 tablet 0  . clopidogrel (PLAVIX) 75 MG tablet Take 1 tablet (75 mg total) by mouth daily. 90 tablet 0  . escitalopram (LEXAPRO) 10 MG tablet  Take 10 mg by mouth daily.    . Ferrous Sulfate (IRON) 325 (65 Fe) MG TABS Take 1 tablet by mouth daily.    . fludrocortisone (FLORINEF) 0.1 MG tablet Take 0.1 mg by mouth daily three (3) times a week on Mondays, Wednesdays, and Fridays.    . furosemide (LASIX) 40 MG tablet TAKE 2 TABLETS BY MOUTH IN THE A.M AND 1 TABLET BY MOUTH IN THE AFTERNOON x'S 1 DAY THEN GO BACK TO 1 TABLET BY MOUTH TWICE A DAY 62 tablet 11  . ipratropium-albuterol (DUONEB) 0.5-2.5 (3) MG/3ML SOLN   1  . methimazole (TAPAZOLE) 10 MG tablet Take 1 tablet (10 mg total) by mouth daily. 30 tablet 0  . metolazone (ZAROXOLYN) 2.5 MG tablet TAKE 1 TABLET BY MOUTH IN THE A.M. 30 MINUTES BEFORE YOU TAKE THE LASIX X'S 2 DAYS THEN STOP. 30 tablet 0  . Multiple Vitamins-Minerals (ICAPS PO) Take 1 tablet by mouth 2 (two) times daily.    . nitroGLYCERIN (NITROSTAT) 0.4 MG SL tablet Place 0.4 mg under the tongue every 5 (five) minutes as needed for chest pain.     . Omega-3 Fatty Acids (FISH OIL) 1000 MG CAPS Take one by mouth twice daily      . OVER THE COUNTER MEDICATION Take 1 tablet by mouth 2 (two) times daily. Med Name: PROSVENT    . pantoprazole (PROTONIX) 20 MG tablet Take 20 mg by mouth daily.    . potassium chloride SA (K-DUR,KLOR-CON) 20 MEQ tablet Take 20 mEq by mouth daily.    . pramipexole (MIRAPEX) 0.5 MG tablet Take 0.5 mg by mouth 3 (three) times daily.    . predniSONE (DELTASONE) 10 MG tablet Take 10 mg by mouth every morning.    . rotigotine (NEUPRO) 6 MG/24HR Place 1 patch onto the skin daily.    Marland Kitchen ULORIC 40 MG tablet Take 40 mg by mouth daily.    . vitamin C (ASCORBIC ACID) 500 MG tablet Take 500 mg by mouth daily.     No current facility-administered medications for this visit.     Allergies:   Ciprofloxacin and Levaquin [levofloxacin]    Social History:  The patient  reports that he quit smoking about 21 years ago. His smoking use included Cigarettes. He has a 45.00 pack-year smoking history. He has never used  smokeless tobacco. He reports that he does not drink alcohol or use drugs.   Family History:  The patient's family history includes Coronary artery disease in his other; Heart attack in his mother.    ROS:  General:no colds or fevers, + weight increases Skin:no rashes or ulcers HEENT:no blurred vision, no congestion CV:see HPI PUL:see HPI GI:no diarrhea constipation or melena, no indigestion GU:no hematuria, no dysuria MS:no joint pain, no claudication Neuro:no syncope, no lightheadedness Endo:no diabetes, no thyroid disease  Wt Readings from Last 3 Encounters:  06/15/16 211 lb (95.7 kg)  05/31/16 204 lb (92.5 kg)  03/15/16 199 lb 6.4 oz (90.4 kg)     PHYSICAL EXAM: VS:  BP 108/60 (BP Location: Right Arm, Patient Position: Sitting, Cuff Size: Large)   Pulse 72   Ht 6' (1.829 m)   Wt 211 lb (95.7 kg)   BMI 28.62 kg/m  , BMI Body mass index is 28.62 kg/m. General:Pleasant affect, NAD Skin:Warm and dry, brisk capillary refill HEENT:normocephalic, sclera clear, mucus membranes moist Neck:supple, + JVD, no bruits  Heart:S1S2 RRR without murmur, gallup, rub or click Lungs: with bibasilar rales,no rhonchi, or wheezes [id number], non tender, + BS, do not palpate liver spleen or masses Ext:3-4+ lower ext edema, to knees bil  ? pedal pulses, legs tender to touch, 2+ radial pulses Neuro:alert and oriented X 3, MAE, follows commands, + facial symmetry    EKG:  EKG is  ordered today. SR with V pacing.    Recent Labs: 05/31/2016: Brain Natriuretic Peptide 219.7 06/03/2016: BUN 27; Creat 1.63; Potassium 3.8; Sodium 142    Lipid Panel No results found for: CHOL, TRIG, HDL, CHOLHDL, VLDL, LDLCALC, LDLDIRECT     Other studies Reviewed: Additional studies/ records that were reviewed today include:  06/17/15 Echo Study Conclusions  - Left ventricle: Wall thickness was increased in a pattern of   moderate LVH. There was moderate concentric hypertrophy. Systolic   function  was moderately to severely reduced. The estimated   ejection fraction was in the range of 30% to 35%. Diffuse   hypokinesis. There is akinesis of the inferolateral and   inferoseptal myocardium. - Aortic valve: Moderately thickened, moderately calcified   leaflets. - Mitral valve: There was mild regurgitation. - Left atrium: The atrium was mildly dilated. - Right ventricle: Pacer wire or catheter noted in right ventricle. - Pulmonary arteries: Systolic pressure was mildly increased. PA   peak pressure: 32 mm Hg (S).  Impressions:  - Compared to the prior study, there has been no significant   interval change.  ASSESSMENT AND PLAN:  1. Acute systolic HF, not improved with increasing po medications.  Dr. Lovena Le has seen and we will admit to tele, and IV diuresis.  prob 3 - 4 day admit.  Wt in office 211 lbs.    2. Chronic systolic CHF s/p BiV ICD: actually appears euvolemic besides LE edema.  I had his optival interrogated which showed no afib   3.  LE edema/cellulitis: worsening LE edema wt increasing  4.  PAF: interrogated his device which shows no afib. Not on chronic OAC  5.  CAD: continue ASA/plavix. No chest pain  6.  Orthostatic hypotension: on florinef will continue for now but if no improvement in edema may consider holding.    Current medicines are reviewed with the patient today.  The patient Has no concerns regarding medicines.  The following changes have been made:  See above Labs/ tests ordered today include:see above  Disposition:   FU:  see above  Signed, Cecilie Kicks, NP  06/15/2016 3:43 PM    [address], [place], [place], [place] Phone: [phone]; Fax: [phone]

## 2016-06-15 NOTE — H&P (Signed)
HISTORY AND PHYSICAL   Date:  06/15/2016   ID:  Thomas [name], DOB September 13, 1933, MRN [id number]  PCP:  Tommye Standard, MD  Cardiologist:  Dr. Tamala Julian EP  Dr. Caryl Comes       Chief Complaint  Patient presents with  . Leg Swelling      History of Present Illness: Thomas [name] is a 83 y.o. male who presents for lower ext edema, seen 05/31/16.    He has a hx. a history of chronic systolic heart failure (EF 30-35%), CAD with prior CHIP LAD and circumflex within Impela support, orthostatic hypotension on Florinef, history of atrial fibrillation, Parkinson's disease, HTN, hyperthyroidism, and polymorphicVT s/p CRT-D (2012) who presents to clinic for evaluation of worsening LE edema.   On the visit on the 19th "optival interrogated which showed no afib. Impedance is near baseline. No CP or worsening SOB. No orthopnea or PND. No dizziness or syncope. No blood in stool or urine. No palpitations. His LE edema has stayed the same and had some weeping. Also legs are very red and hot. No pain. No fevers or chills. "  Dr. Tamala Julian saw patient with PA on the 19th and recommended metolazone 2.5mg  30 minutes before lasix 80mg  tomorrow AMfollowed by lasix 40mg  BID. He will take another metolazone 2.5mg  30 minutes before lasix 40mg  the following day as well.  Started Keflex 500mg  BID x 7days.   Labs improved.    Today wt continues to increase as does edema.  Now edema to knees bil and legs remain red.  He also has DOE that has increased since last appt. No chest pain.         Past Medical History:  Diagnosis Date  . Coronary artery disease    80% distal RCA stenosis.  . Hypertension   . Hypertrophy of prostate without urinary obstruction and other lower urinary tract symptoms (LUTS)    BPH  . Nontoxic multinodular goiter   . Parkinson's disease   . Psychosexual dysfunction with inhibited sexual excitement    ERECTILE DYSFUNCTION  . Status post biventricular cardiac pacemaker  insertion    Status post CRT-D.  Marland Kitchen Thyroid disease    HYPOTHYROIDISM  . Wide-complex tachycardia ([place])          Past Surgical History:  Procedure Laterality Date  . ABDOMINAL ANGIOGRAM  06/10/2014   Procedure: ABDOMINAL ANGIOGRAM;  Surgeon: Sinclair Grooms, MD;  Location: [facility] CATH LAB;  Service: Cardiovascular;;  . CARDIAC CATHETERIZATION    . CARDIAC CATHETERIZATION  06/12/2014   Procedure: CORONARY STENT INTERVENTION W/IMPELLA;  Surgeon: Sinclair Grooms, MD;  Location: [facility] CATH LAB;  Service: Cardiovascular;;  . CARDIOVERSION N/A 06/03/2014   Procedure: CARDIOVERSION;  Surgeon: Thompson Grayer, MD;  Location: [facility] CATH LAB;  Service: Cardiovascular;  Laterality: N/A;  . LEFT HEART CATHETERIZATION WITH CORONARY ANGIOGRAM N/A 06/10/2014   Procedure: LEFT HEART CATHETERIZATION WITH CORONARY ANGIOGRAM;  Surgeon: Sinclair Grooms, MD;  Location: [facility] CATH LAB;  Service: Cardiovascular;  Laterality: N/A;  . PERCUTANEOUS CORONARY ROTOBLATOR INTERVENTION (PCI-R)  06/12/2014   Procedure: PERCUTANEOUS CORONARY ROTOBLATOR INTERVENTION (PCI-R);  Surgeon: Sinclair Grooms, MD;  Location: [facility] CATH LAB;  Service: Cardiovascular;;  . PERCUTANEOUS CORONARY STENT INTERVENTION (PCI-S) N/A 06/12/2014   Procedure: PERCUTANEOUS CORONARY STENT INTERVENTION (PCI-S);  Surgeon: Sinclair Grooms, MD;  Location: [facility] CATH LAB;  Service: Cardiovascular;  Laterality: N/A;           Current Outpatient Prescriptions  Medication Sig  Dispense Refill  . acetaminophen (TYLENOL) 325 MG tablet Take 650 mg by mouth every 4 (four) hours as needed for mild pain.    Marland Kitchen amantadine (SYMMETREL) 100 MG capsule Take 100 mg by mouth 2 (two) times daily.     Marland Kitchen aspirin EC 81 MG EC tablet Take 1 tablet (81 mg total) by mouth daily.    . carbidopa-levodopa (SINEMET IR) 25-100 MG per tablet Take 1 tablet by mouth 3 (three) times daily. 30 tablet 0  . clopidogrel (PLAVIX) 75 MG tablet Take 1 tablet (75 mg total)  by mouth daily. 90 tablet 0  . escitalopram (LEXAPRO) 10 MG tablet Take 10 mg by mouth daily.    . Ferrous Sulfate (IRON) 325 (65 Fe) MG TABS Take 1 tablet by mouth daily.    . fludrocortisone (FLORINEF) 0.1 MG tablet Take 0.1 mg by mouth daily three (3) times a week on Mondays, Wednesdays, and Fridays.    . furosemide (LASIX) 40 MG tablet TAKE 2 TABLETS BY MOUTH IN THE A.M AND 1 TABLET BY MOUTH IN THE AFTERNOON x'S 1 DAY THEN GO BACK TO 1 TABLET BY MOUTH TWICE A DAY 62 tablet 11  . ipratropium-albuterol (DUONEB) 0.5-2.5 (3) MG/3ML SOLN   1  . methimazole (TAPAZOLE) 10 MG tablet Take 1 tablet (10 mg total) by mouth daily. 30 tablet 0  . metolazone (ZAROXOLYN) 2.5 MG tablet TAKE 1 TABLET BY MOUTH IN THE A.M. 30 MINUTES BEFORE YOU TAKE THE LASIX X'S 2 DAYS THEN STOP. 30 tablet 0  . Multiple Vitamins-Minerals (ICAPS PO) Take 1 tablet by mouth 2 (two) times daily.    . nitroGLYCERIN (NITROSTAT) 0.4 MG SL tablet Place 0.4 mg under the tongue every 5 (five) minutes as needed for chest pain.     . Omega-3 Fatty Acids (FISH OIL) 1000 MG CAPS Take one by mouth twice daily            . OVER THE COUNTER MEDICATION Take 1 tablet by mouth 2 (two) times daily. Med Name: PROSVENT    . pantoprazole (PROTONIX) 20 MG tablet Take 20 mg by mouth daily.    . potassium chloride SA (K-DUR,KLOR-CON) 20 MEQ tablet Take 20 mEq by mouth daily.    . pramipexole (MIRAPEX) 0.5 MG tablet Take 0.5 mg by mouth 3 (three) times daily.    . predniSONE (DELTASONE) 10 MG tablet Take 10 mg by mouth every morning.    . rotigotine (NEUPRO) 6 MG/24HR Place 1 patch onto the skin daily.    Marland Kitchen ULORIC 40 MG tablet Take 40 mg by mouth daily.    . vitamin C (ASCORBIC ACID) 500 MG tablet Take 500 mg by mouth daily.     No current facility-administered medications for this visit.     Allergies:   Ciprofloxacin and Levaquin [levofloxacin]    Social History:  The patient  reports that he quit smoking about  21 years ago. His smoking use included Cigarettes. He has a 45.00 pack-year smoking history. He has never used smokeless tobacco. He reports that he does not drink alcohol or use drugs.   Family History:  The patient's family history includes Coronary artery disease in his other; Heart attack in his mother.    ROS:  General:no colds or fevers, + weight increases Skin:no rashes or ulcers HEENT:no blurred vision, no congestion CV:see HPI PUL:see HPI GI:no diarrhea constipation or melena, no indigestion GU:no hematuria, no dysuria MS:no joint pain, no claudication Neuro:no syncope, no lightheadedness Endo:no diabetes, no  thyroid disease     Wt Readings from Last 3 Encounters:  06/15/16 211 lb (95.7 kg)  05/31/16 204 lb (92.5 kg)  03/15/16 199 lb 6.4 oz (90.4 kg)     PHYSICAL EXAM: VS:  BP 108/60 (BP Location: Right Arm, Patient Position: Sitting, Cuff Size: Large)   Pulse 72   Ht 6' (1.829 m)   Wt 211 lb (95.7 kg)   BMI 28.62 kg/m  , BMI Body mass index is 28.62 kg/m. General:Pleasant affect, NAD Skin:Warm and dry, brisk capillary refill HEENT:normocephalic, sclera clear, mucus membranes moist Neck:supple, + JVD, no bruits  Heart:S1S2 RRR without murmur, gallup, rub or click Lungs: with bibasilar rales,no rhonchi, or wheezes [id number], non tender, + BS, do not palpate liver spleen or masses Ext:3-4+ lower ext edema, to knees bil  ? pedal pulses, legs tender to touch, 2+ radial pulses Neuro:alert and oriented X 3, MAE, follows commands, + facial symmetry    EKG:  EKG is  ordered today. SR with V pacing.    Recent Labs: 05/31/2016: Brain Natriuretic Peptide 219.7 06/03/2016: BUN 27; Creat 1.63; Potassium 3.8; Sodium 142    Lipid Panel Labs (Brief)  No results found for: CHOL, TRIG, HDL, CHOLHDL, VLDL, LDLCALC, LDLDIRECT       Other studies Reviewed: Additional studies/ records that were reviewed today include:  06/17/15 Echo Study  Conclusions  - Left ventricle: Wall thickness was increased in a pattern of moderate LVH. There was moderate concentric hypertrophy. Systolic function was moderately to severely reduced. The estimated ejection fraction was in the range of 30% to 35%. Diffuse hypokinesis. There is akinesis of the inferolateral and inferoseptal myocardium. - Aortic valve: Moderately thickened, moderately calcified leaflets. - Mitral valve: There was mild regurgitation. - Left atrium: The atrium was mildly dilated. - Right ventricle: Pacer wire or catheter noted in right ventricle. - Pulmonary arteries: Systolic pressure was mildly increased. PA peak pressure: 32 mm Hg (S).  Impressions:  - Compared to the prior study, there has been no significant interval change.  ASSESSMENT AND PLAN:  1. Acute systolic HF, not improved with increasing po medications.  Dr. Lovena Le has seen and we will admit to tele, and IV diuresis.  prob 3 - 4 day admit.  Wt in office 211 lbs.    2. Chronic systolic CHF s/p BiV ICD: actually appears euvolemic besides LE edema. I had his optival interrogated which showed no afib   3.  LE edema/cellulitis: worsening LE edema wt increasing  4.  PAF: interrogated his device which shows no afib. Not on chronic OAC  5.  CAD: continue ASA/plavix. No chest pain  6.  Orthostatic hypotension: on florinef will continue for now but if no improvement in edema may consider holding.    Current medicines are reviewed with the patient today.  The patient Has no concerns regarding medicines.  Cecilie Kicks, FNP-C [facility] [place]  [id number] or after 5pm and on weekends call [phone] 06/15/2016.now     Cardiology Attending  Patient seen and examined with Cecilie Kicks, FNP-C. The patient presents with volume overload, having failed medical therapy. He will be admitted for IV diuretic therapy. I will defer the initiation of IV anti-biotics.  His led is red but this may all be due to swelling and not infection per se.  Mikle Bosworth.D.

## 2016-06-15 NOTE — Progress Notes (Signed)
Per pharmacy, the hospital does not carry the Neupro patch. Spoke with pt, he will contact his son and have him bring them up here, advised pharmacy of this. Other medications have now been verified and will be given.

## 2016-06-15 NOTE — Patient Instructions (Addendum)
We are sending you to the hospital for a direct admission to treat your acute systolic heart failure.

## 2016-06-15 NOTE — Progress Notes (Signed)
Patient lying in bed, no needs at this time. Repositioned in bed, bed alarm on.

## 2016-06-16 DIAGNOSIS — I2510 Atherosclerotic heart disease of native coronary artery without angina pectoris: Secondary | ICD-10-CM

## 2016-06-16 DIAGNOSIS — N183 Chronic kidney disease, stage 3 (moderate): Secondary | ICD-10-CM

## 2016-06-16 DIAGNOSIS — G20 Parkinson's disease: Secondary | ICD-10-CM

## 2016-06-16 DIAGNOSIS — Z950 Presence of cardiac pacemaker: Secondary | ICD-10-CM

## 2016-06-16 LAB — BASIC METABOLIC PANEL
ANION GAP: 8 (ref 5–15)
BUN: 20 mg/dL (ref 6–20)
CHLORIDE: 103 mmol/L (ref 101–111)
CO2: 29 mmol/L (ref 22–32)
Calcium: 8.5 mg/dL — ABNORMAL LOW (ref 8.9–10.3)
Creatinine, Ser: 1.66 mg/dL — ABNORMAL HIGH (ref 0.61–1.24)
GFR, EST AFRICAN AMERICAN: 43 mL/min — AB (ref >60)
GFR, EST NON AFRICAN AMERICAN: 37 mL/min — AB (ref >60)
Glucose, Bld: 86 mg/dL (ref 65–99)
Potassium: 3.7 mmol/L (ref 3.5–5.1)
SODIUM: 140 mmol/L (ref 135–145)

## 2016-06-16 LAB — TROPONIN I: Troponin I: 0.03 ng/mL (ref <0.03)

## 2016-06-16 NOTE — Care Management Note (Signed)
Case Management Note Marvetta Gibbons RN, BSN Unit 2W-Case Manager [phone]  Patient Details  Name: Thomas [name] MRN: [id number] Date of Birth: August 03, 1933  Subjective/Objective:  Pt admitted with acute on Chronic HF- vol. overload                  Action/Plan: PTA pt lived at home alone- CM to follow for d/c needs  Expected Discharge Date:                  Expected Discharge Plan:  [place]  In-House Referral:     Discharge planning Services  CM Consult  Post Acute Care Choice:    Choice offered to:     DME Arranged:    DME Agency:     HH Arranged:    HH Agency:     Status of Service:  In process, will continue to follow  If discussed at Long Length of Stay Meetings, dates discussed:    Additional Comments:  Dawayne Patricia, RN 06/16/2016, 9:58 AM

## 2016-06-16 NOTE — Progress Notes (Addendum)
Patient Name: Thomas [name] Date of Encounter: 06/16/2016  Primary Cardiologist: H.W. B.Smith, III, M.D.  Hospital Problem List     Principal Problem:   Acute on chronic systolic HF (heart failure) (HCC) Active Problems:   Essential hypertension   Status post biventricular cardiac pacemaker procedure   Chronic renal insufficiency, stage III (moderate)   CAD (coronary artery disease), native coronary artery     Subjective   Seen in mid December with increasing lower extremity swelling. Metolazone and furosemide lead to some improvement in lower extremity swelling. After the 2 doses of metolazone, fluid gradually began reaccumulating. No chills or fever. Lower extremity swelling is present but redness has resolved. Keflex was also prescribed a mid January for possible cellulitis. He denies chest pain.  Inpatient Medications    Scheduled Meds: . amantadine  100 mg Oral BID  . aspirin EC  81 mg Oral Daily  . carbidopa-levodopa  1 tablet Oral TID  . clopidogrel  75 mg Oral Daily  . escitalopram  10 mg Oral Daily  . febuxostat  40 mg Oral Daily  . ferrous sulfate  325 mg Oral Daily  . [START ON 06/17/2016] fludrocortisone  0.1 mg Oral Once per day on Mon Wed Fri  . furosemide  80 mg Intravenous Q12H  . heparin  5,000 Units Subcutaneous Q8H  . methimazole  10 mg Oral Daily  . omega-3 acid ethyl esters  1 g Oral Daily  . potassium chloride SA  20 mEq Oral Daily  . pramipexole  0.5 mg Oral TID  . predniSONE  10 mg Oral Q breakfast  . rotigotine  1 patch Transdermal Daily  . sodium chloride flush  3 mL Intravenous Q12H  . vitamin C  500 mg Oral Daily   Continuous Infusions:  PRN Meds: sodium chloride, acetaminophen, ipratropium-albuterol, nitroGLYCERIN, ondansetron (ZOFRAN) IV, sodium chloride flush   Vital Signs    Vitals:   06/15/16 1700 06/15/16 2057 06/16/16 0645  BP: 113/74 (!) 98/56 105/61  Pulse: 70 69 64  Resp:   18  Temp: 98.2 F (36.8 C) 97.9 F (36.6 C) 98.1  F (36.7 C)  TempSrc: Oral Oral Oral  SpO2: 95% 94% 95%  Weight: 206 lb 4.8 oz (93.6 kg)  201 lb 8 oz (91.4 kg)    Intake/Output Summary (Last 24 hours) at 06/16/16 0916 Last data filed at 06/16/16 0557  Gross per 24 hour  Intake                0 ml  Output             2150 ml  Net            -2150 ml   Filed Weights   06/15/16 1700 06/16/16 0645  Weight: 206 lb 4.8 oz (93.6 kg) 201 lb 8 oz (91.4 kg)    Physical Exam  Elderly and frail. Has parkinsonism. GEN: Well nourished, well developed, in no acute distress.  HEENT: Grossly normal.  Neck: Supple, no JVD, carotid bruits, or masses. Cardiac: RRR, no murmurs, rubs, or gallops. No clubbing, cyanosis.  Radials/DP/PT revealed 2+ radials. Pedal pulses are difficult to palpate.Marland Kitchen He does have lichenification of the skin in both lower extremities from prior significant edema. Dramatic improvement in lower extremity edema compared to description on admission. Respiratory:  Respirations regular and unlabored, clear to auscultation bilaterally. GI: Soft, nontender, nondistended, BS + x 4. MS: no deformity or atrophy. Skin: warm and dry, no rash. Neuro:  Strength  and sensation are intact. Tremor. Slow speech. Psych: AAOx3.  Normal affect.  Labs    CBC  Recent Labs  06/15/16 1657  WBC 4.9  NEUTROABS 3.2  HGB 11.3*  HCT 35.7*  MCV 91.1  PLT 123456*   Basic Metabolic Panel  Recent Labs  06/15/16 1657 06/16/16 0441  NA 142 140  K 4.0 3.7  CL 104 103  CO2 30 29  GLUCOSE 88 86  BUN 20 20  CREATININE 1.72* 1.66*  CALCIUM 8.8* 8.5*  MG 2.7*  --    Liver Function Tests  Recent Labs  06/15/16 1657  AST 20  ALT 11*  ALKPHOS 78  BILITOT 0.7  PROT 6.0*  ALBUMIN 3.6   No results for input(s): LIPASE, AMYLASE in the last 72 hours. Cardiac Enzymes  Recent Labs  06/15/16 1657 06/15/16 2252 06/16/16 0441  TROPONINI <0.03 0.03* 0.03*   BNP    Component Value Date/Time   BNP 532.4 (H) 06/15/2016 1657   BNP 219.7  (H) 05/31/2016 1439    ProBNP    Component Value Date/Time   PROBNP 375.0 (H) 12/03/2014 1557     Telemetry    AV sequential pacing. - Personally Reviewed  ECG    No tracing was performed on admission. - Personally Reviewed  Radiology    Dg Chest Port 1 View  Result Date: 06/15/2016 CLINICAL DATA:  Shortness of breath with lower extremity edema EXAM: PORTABLE CHEST 1 VIEW COMPARISON:  March 05, 2015 FINDINGS: There is interstitial edema. No airspace consolidation. There is cardiomegaly with pulmonary venous hypertension. Defibrillator leads are attached to the right atrium, right ventricle, and left ventricle. There is atherosclerotic calcification in the aorta. No adenopathy. No bone lesions. IMPRESSION: Stable defibrillator lead positions. Congestive heart failure. Aortic atherosclerosis. Electronically Signed   By: Lowella Grip III M.D.   On: 06/15/2016 21:41    Cardiac Studies   No recent studies other than device interrogation in December which revealed stable Opti-vol. Last echo LVEF 06/17/15 was 30-35%.  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 After identification of coronary disease and underwent. He underwent CHIP utilizing Impela hemodynamic support and rotational atherectomy and stenting of the LAD and DES stent in circumflex December 2015 with improvement in LVEF to 35% January 2017. Presents now with refractory lower extremity swelling and dyspnea despite escalation of outpatient diuretic regimen.  Assessment & Plan    1. Acute on chronic systolic heart failure manifested by more right than left heart failure. Significant diuresis overnight and improvement in clinical status. We will repeat a 2-D Doppler echocardiogram to determine if right heart  failure is more of a problem and reassess LV function. Will continue diuretic therapy for now. Weight is down 4 pounds, -2.2 L over the last 12 hours. 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 Will apply support stockings. We will check orthostatic blood pressure. May consider heart failure team evaluation. If we can support the blood pressure, I will left presumed ARB and beta blocker therapy. 2. Mixed ischemic/nonischemic myopathy - most recent LVEF 35% January, 2017.  3. Coronary artery disease status post LAD and circumflex DES 2015. No evidence of myocardial infarction on this occasion 4. Chronic kidney disease, stage III.  Despite aggressive diuresis, kidney function has remained stable overnight. 5. Biventricular defibrillator 6. No recent atrial fibrillation. History of PAF. 7. Parkinsonism vs Shy Drager  Signed, Sinclair Grooms, MD  06/16/2016, 9:16 AM

## 2016-06-17 ENCOUNTER — Inpatient Hospital Stay (HOSPITAL_COMMUNITY): Payer: Medicare Other

## 2016-06-17 DIAGNOSIS — I509 Heart failure, unspecified: Secondary | ICD-10-CM

## 2016-06-17 LAB — BASIC METABOLIC PANEL
ANION GAP: 8 (ref 5–15)
BUN: 22 mg/dL — ABNORMAL HIGH (ref 6–20)
CALCIUM: 8.5 mg/dL — AB (ref 8.9–10.3)
CO2: 30 mmol/L (ref 22–32)
Chloride: 102 mmol/L (ref 101–111)
Creatinine, Ser: 1.61 mg/dL — ABNORMAL HIGH (ref 0.61–1.24)
GFR calc Af Amer: 45 mL/min — ABNORMAL LOW (ref >60)
GFR calc non Af Amer: 39 mL/min — ABNORMAL LOW (ref >60)
GLUCOSE: 86 mg/dL (ref 65–99)
Potassium: 3.4 mmol/L — ABNORMAL LOW (ref 3.5–5.1)
Sodium: 140 mmol/L (ref 135–145)

## 2016-06-17 LAB — ECHOCARDIOGRAM COMPLETE
Height: 72 in
WEIGHTICAEL: 3147.2 [oz_av]

## 2016-06-17 MED ORDER — MAGNESIUM HYDROXIDE 400 MG/5ML PO SUSP
15.0000 mL | Freq: Every day | ORAL | Status: DC | PRN
  Administered 2016-06-17 – 2016-06-19 (×2): 15 mL via ORAL
  Filled 2016-06-17 (×3): qty 30

## 2016-06-17 NOTE — Progress Notes (Addendum)
Patient Name: Thomas [name] Date of Encounter: 06/17/2016  Primary Cardiologist: H.W. B.Kyley Solow, III, M.D.  Hospital Problem List     Principal Problem:   Acute on chronic systolic HF (heart failure) (HCC) Active Problems:   Essential hypertension   Non-ischemic cardiomyopathy-EF 25-30%   Status post biventricular cardiac pacemaker procedure   Chronic renal insufficiency, stage III (moderate)   Coronary artery disease involving native coronary artery of native heart without angina pectoris   Parkinson disease (Thomas [name])    Subjective   Reports feeling much better, decreased lower extremity swelling.   Inpatient Medications    Scheduled Meds: . amantadine  100 mg Oral BID  . aspirin EC  81 mg Oral Daily  . carbidopa-levodopa  1 tablet Oral TID  . clopidogrel  75 mg Oral Daily  . escitalopram  10 mg Oral Daily  . febuxostat  40 mg Oral Daily  . ferrous sulfate  325 mg Oral Daily  . fludrocortisone  0.1 mg Oral Once per day on Mon Wed Fri  . furosemide  80 mg Intravenous Q12H  . heparin  5,000 Units Subcutaneous Q8H  . methimazole  10 mg Oral Daily  . omega-3 acid ethyl esters  1 g Oral Daily  . potassium chloride SA  20 mEq Oral Daily  . pramipexole  0.5 mg Oral TID  . predniSONE  10 mg Oral Q breakfast  . rotigotine  1 patch Transdermal Daily  . sodium chloride flush  3 mL Intravenous Q12H  . vitamin C  500 mg Oral Daily   Continuous Infusions:  PRN Meds: sodium chloride, acetaminophen, ipratropium-albuterol, magnesium hydroxide, nitroGLYCERIN, ondansetron (ZOFRAN) IV, sodium chloride flush   Vital Signs    Vitals:   06/16/16 0645 06/16/16 1406 06/16/16 2013 06/17/16 0457  BP: 105/61 119/60 112/69 (!) 105/55  Pulse: 64 64 (!) 59 (!) 59  Resp: 18 20 20 20   Temp: 98.1 F (36.7 C) 98.4 F (36.9 C) 97.5 F (36.4 C) 97.7 F (36.5 C)  TempSrc: Oral Oral Oral Oral  SpO2: 95% 98% 97% 98%  Weight: 201 lb 8 oz (91.4 kg)   196 lb 11.2 oz (89.2 kg)    Intake/Output  Summary (Last 24 hours) at 06/17/16 1020 Last data filed at 06/17/16 0816  Gross per 24 hour  Intake              360 ml  Output             4550 ml  Net            -4190 ml   Filed Weights   06/15/16 1700 06/16/16 0645 06/17/16 0457  Weight: 206 lb 4.8 oz (93.6 kg) 201 lb 8 oz (91.4 kg) 196 lb 11.2 oz (89.2 kg)    Physical Exam  Elderly and frail. Has parkinsonism. GEN: Well nourished, well developed, in no acute distress.  HEENT: Grossly normal.  Neck: Supple, no JVD, carotid bruits, or masses. Cardiac: RRR, no murmurs, rubs, or gallops. No clubbing, cyanosis.  Radials/DP/PT revealed 2+ radials. Pedal pulses are difficult to palpate.Marland Kitchen He does have lichenification of the skin in both lower extremities from prior significant edema. Dramatic improvement in lower extremity edema compared to description on admission, further improved today.  Respiratory:  Respirations regular and unlabored, clear to auscultation bilaterally. GI: Soft, nontender, nondistended, BS + x 4. MS: no deformity or atrophy. Skin: warm and dry, no rash. Neuro:  Strength and sensation are intact. Tremor. Slow speech. Psych: AAOx3.  Normal affect.  Labs    CBC  Recent Labs  06/15/16 1657  WBC 4.9  NEUTROABS 3.2  HGB 11.3*  HCT 35.7*  MCV 91.1  PLT 123456*   Basic Metabolic Panel  Recent Labs  06/15/16 1657 06/16/16 0441 06/17/16 0420  NA 142 140 140  K 4.0 3.7 3.4*  CL 104 103 102  CO2 30 29 30   GLUCOSE 88 86 86  BUN 20 20 22*  CREATININE 1.72* 1.66* 1.61*  CALCIUM 8.8* 8.5* 8.5*  MG 2.7*  --   --    Liver Function Tests  Recent Labs  06/15/16 1657  AST 20  ALT 11*  ALKPHOS 78  BILITOT 0.7  PROT 6.0*  ALBUMIN 3.6   No results for input(s): LIPASE, AMYLASE in the last 72 hours. Cardiac Enzymes  Recent Labs  06/15/16 1657 06/15/16 2252 06/16/16 0441  TROPONINI <0.03 0.03* 0.03*   BNP    Component Value Date/Time   BNP 532.4 (H) 06/15/2016 1657   BNP 219.7 (H) 05/31/2016  1439    ProBNP    Component Value Date/Time   PROBNP 375.0 (H) 12/03/2014 1557     Telemetry    AV sequential pacing. - Personally Reviewed  ECG    No tracing was performed on admission. - Personally Reviewed  Radiology    Dg Chest Port 1 View  Result Date: 06/15/2016 CLINICAL DATA:  Shortness of breath with lower extremity edema EXAM: PORTABLE CHEST 1 VIEW COMPARISON:  March 05, 2015 FINDINGS: There is interstitial edema. No airspace consolidation. There is cardiomegaly with pulmonary venous hypertension. Defibrillator leads are attached to the right atrium, right ventricle, and left ventricle. There is atherosclerotic calcification in the aorta. No adenopathy. No bone lesions. IMPRESSION: Stable defibrillator lead positions. Congestive heart failure. Aortic atherosclerosis. Electronically Signed   By: Lowella Grip III M.D.   On: 06/15/2016 21:41    Cardiac Studies   No recent studies other than device interrogation in December which revealed stable Opti-vol. Last echo LVEF 06/17/15 was 30-35%.  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 After identification of coronary disease and underwent. He underwent CHIP utilizing Impela hemodynamic support and rotational atherectomy and stenting of the LAD and DES stent in circumflex December 2015 with improvement in LVEF to 35% January 2017. Presents now with refractory lower extremity swelling and dyspnea despite escalation of outpatient diuretic regimen.  Assessment & Plan    1. Acute on chronic systolic heart failure manifested by more right than left heart failure. Significant diuresis thus far and improvement in clinical status.  -- Will continue diuretic therapy for now. Weight is down 10 pounds, -3.3 L over the  last 12 hours. Plan to transition to oral diuretic in the am. 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 -- Continue support stockings. May consider heart failure team evaluation. If we can support the blood pressure, would like to resume ARB and beta blocker therapy, though does remain soft today. -- lives alone, concern for independence. Will have PT eval as he is unsteady on his feet per nursing report.  2. Mixed ischemic/nonischemic myopathy - most recent LVEF 35% January, 2017.   3. Coronary artery disease status post LAD and circumflex DES 2015.  No evidence of myocardial infarction on this occasion  4. Chronic kidney disease, stage III. Despite aggressive diuresis, kidney function has remained stable.  5. Biventricular defibrillator  6. Hx of PAF. None on telemetry  7. Parkinsonism vs [address]  Signed, Reino Bellis, NP  06/17/2016, 10:20 AM   The patient has been seen in conjunction with Harlan Stains, NP-C. All aspects of care have been considered and discussed. The patient has been personally interviewed, examined, and all clinical data has been reviewed.   Continues to diurese. Will consider switching to oral diuretics in a.m. depending upon clinical course. I recommend continuing IV diuresis until there is a bump in kidney function. Potassium needs to be repleted.  LVEF 25% based on updated echo.  Blood pressure is somewhat improved despite diuresis. Kidney function has also slightly improved despite diuresis.  Add low-dose beta blocker therapy.  Consider adding hydralazine and long-acting nitrate over weekend if possible.

## 2016-06-17 NOTE — Progress Notes (Signed)
  Echocardiogram 2D Echocardiogram has been performed.  Jennette Dubin 06/17/2016, 1:34 PM

## 2016-06-17 NOTE — Progress Notes (Signed)
Pt daughter at bedside, states that she brought home dose of neupro patch and already placed new one on father's abdomen for today's dose.  She brought a few extra patches which are currently in pt bedside table drawer.  Night shift RN notified for to follow up with taking med to pharmacy for dispensing future doses.

## 2016-06-18 DIAGNOSIS — I10 Essential (primary) hypertension: Secondary | ICD-10-CM

## 2016-06-18 DIAGNOSIS — I428 Other cardiomyopathies: Secondary | ICD-10-CM

## 2016-06-18 LAB — BASIC METABOLIC PANEL
Anion gap: 12 (ref 5–15)
BUN: 28 mg/dL — ABNORMAL HIGH (ref 6–20)
CALCIUM: 8.6 mg/dL — AB (ref 8.9–10.3)
CHLORIDE: 99 mmol/L — AB (ref 101–111)
CO2: 30 mmol/L (ref 22–32)
Creatinine, Ser: 1.55 mg/dL — ABNORMAL HIGH (ref 0.61–1.24)
GFR calc non Af Amer: 41 mL/min — ABNORMAL LOW (ref >60)
GFR, EST AFRICAN AMERICAN: 47 mL/min — AB (ref >60)
Glucose, Bld: 102 mg/dL — ABNORMAL HIGH (ref 65–99)
Potassium: 3.4 mmol/L — ABNORMAL LOW (ref 3.5–5.1)
Sodium: 141 mmol/L (ref 135–145)

## 2016-06-18 MED ORDER — POTASSIUM CHLORIDE CRYS ER 20 MEQ PO TBCR
40.0000 meq | EXTENDED_RELEASE_TABLET | Freq: Every day | ORAL | Status: DC
  Administered 2016-06-18 – 2016-06-21 (×4): 40 meq via ORAL
  Filled 2016-06-18 (×4): qty 2

## 2016-06-18 MED ORDER — ROTIGOTINE 6 MG/24HR TD PT24
1.0000 | MEDICATED_PATCH | Freq: Every day | TRANSDERMAL | Status: DC
  Administered 2016-06-18: 1 via TRANSDERMAL
  Filled 2016-06-18 (×2): qty 1

## 2016-06-18 NOTE — Progress Notes (Addendum)
Patient Name: Thomas [name] Date of Encounter: 06/18/2016  Primary Cardiologist: H.W. B.Smith, III, M.D.  Hospital Problem List     Principal Problem:   Acute on chronic systolic HF (heart failure) (HCC) Active Problems:   Essential hypertension   Non-ischemic cardiomyopathy-EF 25-30%   Status post biventricular cardiac pacemaker procedure   Chronic renal insufficiency, stage III (moderate)   Coronary artery disease involving native coronary artery of native heart without angina pectoris   Parkinson disease ([place])    Subjective   Feeling better, decreased lower extremity swelling.   Inpatient Medications    Scheduled Meds: . amantadine  100 mg Oral BID  . aspirin EC  81 mg Oral Daily  . carbidopa-levodopa  1 tablet Oral TID  . clopidogrel  75 mg Oral Daily  . escitalopram  10 mg Oral Daily  . febuxostat  40 mg Oral Daily  . ferrous sulfate  325 mg Oral Daily  . fludrocortisone  0.1 mg Oral Once per day on Mon Wed Fri  . furosemide  80 mg Intravenous Q12H  . heparin  5,000 Units Subcutaneous Q8H  . methimazole  10 mg Oral Daily  . omega-3 acid ethyl esters  1 g Oral Daily  . potassium chloride SA  20 mEq Oral Daily  . pramipexole  0.5 mg Oral TID  . predniSONE  10 mg Oral Q breakfast  . rotigotine  1 patch Transdermal Daily  . sodium chloride flush  3 mL Intravenous Q12H  . vitamin C  500 mg Oral Daily   Continuous Infusions:  PRN Meds: sodium chloride, acetaminophen, ipratropium-albuterol, magnesium hydroxide, nitroGLYCERIN, ondansetron (ZOFRAN) IV, sodium chloride flush   Vital Signs    Vitals:   06/17/16 0457 06/17/16 1359 06/17/16 2106 06/18/16 0531  BP: (!) 105/55 (!) 122/59 95/60   Pulse: (!) 59 61 60   Resp: 20 17 18 18   Temp: 97.7 F (36.5 C) 98 F (36.7 C) 98.2 F (36.8 C) 98 F (36.7 C)  TempSrc: Oral Oral Oral Oral  SpO2: 98% 98% 98%   Weight: 196 lb 11.2 oz (89.2 kg)   195 lb 3.2 oz (88.5 kg)    Intake/Output Summary (Last 24 hours) at  06/18/16 0833 Last data filed at 06/18/16 [id number]  Gross per 24 hour  Intake              720 ml  Output             3350 ml  Net            -2630 ml   Filed Weights   06/16/16 0645 06/17/16 0457 06/18/16 0531  Weight: 201 lb 8 oz (91.4 kg) 196 lb 11.2 oz (89.2 kg) 195 lb 3.2 oz (88.5 kg)    Physical Exam  Elderly and frail. Has parkinsonism. GEN: Well nourished, well developed, in no acute distress.  HEENT: Grossly normal.  Neck: Supple, no JVD, carotid bruits, or masses. Cardiac: RRR, no murmurs, rubs, or gallops. No clubbing, cyanosis.  Radials/DP/PT revealed 2+ radials. Pedal pulses are difficult to palpate.Marland Kitchen He does have lichenification of the skin in both lower extremities from prior significant edema. Dramatic improvement in lower extremity edema compared to description on admission, and none on exam today Respiratory:  Respirations regular and unlabored, clear to auscultation bilaterally. GI: Soft, nontender, nondistended, BS + x 4. MS: no deformity or atrophy. Skin: warm and dry, no rash. Neuro:  Strength and sensation are intact. Tremor. Slow speech. Psych: AAOx3.  Normal  affect.  Labs    CBC  Recent Labs  06/15/16 1657  WBC 4.9  NEUTROABS 3.2  HGB 11.3*  HCT 35.7*  MCV 91.1  PLT 123456*   Basic Metabolic Panel  Recent Labs  06/15/16 1657  06/17/16 0420 06/18/16 0204  NA 142  < > 140 141  K 4.0  < > 3.4* 3.4*  CL 104  < > 102 99*  CO2 30  < > 30 30  GLUCOSE 88  < > 86 102*  BUN 20  < > 22* 28*  CREATININE 1.72*  < > 1.61* 1.55*  CALCIUM 8.8*  < > 8.5* 8.6*  MG 2.7*  --   --   --   < > = values in this interval not displayed. Liver Function Tests  Recent Labs  06/15/16 1657  AST 20  ALT 11*  ALKPHOS 78  BILITOT 0.7  PROT 6.0*  ALBUMIN 3.6   No results for input(s): LIPASE, AMYLASE in the last 72 hours. Cardiac Enzymes  Recent Labs  06/15/16 1657 06/15/16 2252 06/16/16 0441  TROPONINI <0.03 0.03* 0.03*   BNP    Component Value  Date/Time   BNP 532.4 (H) 06/15/2016 1657   BNP 219.7 (H) 05/31/2016 1439    ProBNP    Component Value Date/Time   PROBNP 375.0 (H) 12/03/2014 1557     Telemetry    AV sequential pacing. - Personally Reviewed  ECG    No tracing was performed on admission. - Personally Reviewed  Radiology    No results found.  Cardiac Studies   No recent studies other than device interrogation in December which revealed stable Opti-vol. Last echo LVEF 06/17/15 was 30-35%.  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 After identification of coronary disease he underwent CHIP utilizing Impela hemodynamic support and rotational atherectomy and stenting of the LAD and DES stent in circumflex December 2015 with improvement in LVEF to 35% January 2017. Presents now with refractory lower extremity swelling and dyspnea despite escalation of outpatient diuretic regimen.  Assessment & Plan    1. Acute on chronic systolic heart failure manifested by more right than left heart failure. Significant diuresis thus far and improvement in clinical status.  -- Will continue diuretic therapy for now. Weight is down 16 pounds (211>>195 lbs), -2.6 L over the last 24 hours and net neg 9.9L. Plan to transition to oral diuretic once we see a bump in creatinine per Dr. Thompson Caul note on Friday so will continue on IV Lasix for now as creatinine still declining.  BUN has bumped slightly so suspect tomorrow we will change to PO. Management of heart failure has been limited by severe orthostasis and recurrent episodes of near syncope (possibly related to "Parkinsonism "-the Shy Drager syndrome) Orthostatics this am were fine. -- Continue support stockings. May consider heart failure team evaluation. If we can support  the blood pressure, would like to resume ARB and beta blocker therapy, though does remain soft today. -- lives alone, concern for independence. Will have PT eval as he is unsteady on his feet per nursing report.  2. Mixed ischemic/nonischemic myopathy - most recent LVEF 35% January, 2017. Soft BP limits addition of BB or Hydralazine at this time.  3. Coronary artery disease status post LAD and circumflex DES 2015. No evidence of myocardial  infarction on this occasion  4. Chronic kidney disease, stage III. Despite aggressive diuresis, kidney function has remained stable.  5. Biventricular defibrillator  6. Hx of PAF. None on telemetry  7.  Hypokalemia - replete  7. 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  Continue Florinef for now but question if this is contributing to his CHF.  Consider addition of proamatine or mestinone  if orthostasis worsens.  Signed, Fransico Him, MD  06/18/2016, 8:33 AM

## 2016-06-18 NOTE — Progress Notes (Addendum)
Patient has had a couple of short runs of Vent._Tach vs WCT. Patient has been sleeping each time with no complaints voice w/ this rhythm. Pt. Has ICD and Hx of V-Tach. Cont. To monitor pt. And rhythm

## 2016-06-18 NOTE — Progress Notes (Signed)
Patient home med taken to main pharm. Only 2 patches in box.

## 2016-06-18 NOTE — Evaluation (Signed)
Physical Therapy Evaluation Patient Details Name: Thomas [name] MRN: [id number] DOB: [birth year] Today's Date: 06/18/2016   History of Present Illness  Pt is an 83 y/o male admitted secondary to LE edema with CHF exacerbation. PMH including but not limited to CAD, HTN, Parkinson's and s/p biventricular cardiac pacemaker placement.   Clinical Impression  Pt presented supine in bed with HOB elevated, awake and willing to participate in therapy session. Prior to admission, pt reported that he was mod I with all functional mobility with use of RW. Pt currently requires min guard for bed mobility, mod A with transfers and min A to ambulate short distances with use of RW. Pt stated that he lives alone, but his daughter is able to assist him PRN; however, he does not have anyone that can be available for 24/7 supervision. Therapist suggested a short term rehab stay prior to d/c'ing home; however, pt currently refusing but agreeable to [facility]. Pt would continue to benefit from skilled physical therapy services at this time while admitted and after d/c to address his below listed limitations in order to improve his overall safety and independence with functional mobility.   Follow Up Recommendations SNF;Other (comment) (pt is refusing SNF at this time, pt will need HHPT)    Equipment Recommendations  None recommended by PT    Recommendations for Other Services       Precautions / Restrictions Precautions Precautions: Fall Restrictions Weight Bearing Restrictions: No      Mobility  Bed Mobility Overal bed mobility: Needs Assistance Bed Mobility: Supine to Sit     Supine to sit: Min guard;HOB elevated     General bed mobility comments: pt required increased time and effort, min guard for safety  Transfers Overall transfer level: Needs assistance Equipment used: Rolling walker (2 wheeled) Transfers: Sit to/from Stand Sit to Stand: Mod assist;From elevated surface         General  transfer comment: Pt required increased time, elevated bed and mod A to achieve full standing position with VC'ing for anterior weight shift as pt with posterior lean  Ambulation/Gait Ambulation/Gait assistance: Min assist Ambulation Distance (Feet): 20 Feet Assistive device: Rolling walker (2 wheeled) Gait Pattern/deviations: Step-through pattern;Decreased stride length;Shuffle Gait velocity: decreased Gait velocity interpretation: Below normal speed for age/gender General Gait Details: pt required min A with movement of RW, especially with turning; mild instability but no LOB  Stairs            Wheelchair Mobility    Modified Rankin (Stroke Patients Only)       Balance Overall balance assessment: Needs assistance Sitting-balance support: Feet supported Sitting balance-Leahy Scale: Fair     Standing balance support: During functional activity;Bilateral upper extremity supported Standing balance-Leahy Scale: Poor Standing balance comment: pt reliant on bilateral UEs on RW; initially with posterior lean that required verbal and tactile cueing for anterior weight shift                             Pertinent Vitals/Pain Pain Assessment: No/denies pain    Home Living Family/patient expects to be discharged to:: Private residence Living Arrangements: Alone Available Help at Discharge: Family;Available PRN/intermittently Type of Home: House Home Access: Level entry     Home Layout: Two level;Other (Comment) (has basement but does not need to go down there) Home Equipment: Walker - 2 wheels;Wheelchair - Education officer, community - power      Prior Function Level of Independence: Independent with assistive device(s)  Comments: Pt reported that he ambulated with use of RW      Hand Dominance        Extremity/Trunk Assessment   Upper Extremity Assessment Upper Extremity Assessment: Generalized weakness    Lower Extremity Assessment Lower  Extremity Assessment: Generalized weakness       Communication   Communication: No difficulties  Cognition Arousal/Alertness: Awake/alert Behavior During Therapy: WFL for tasks assessed/performed Overall Cognitive Status: Within Functional Limits for tasks assessed                      General Comments      Exercises     Assessment/Plan    PT Assessment Patient needs continued PT services  PT Problem List Decreased strength;Decreased activity tolerance;Decreased balance;Decreased mobility;Decreased coordination;Decreased knowledge of use of DME;Decreased safety awareness          PT Treatment Interventions DME instruction;Gait training;Stair training;Functional mobility training;Therapeutic activities;Therapeutic exercise;Balance training;Neuromuscular re-education;Patient/family education    PT Goals (Current goals can be found in the Care Plan section)  Acute Rehab PT Goals Patient Stated Goal: return home PT Goal Formulation: With patient Time For Goal Achievement: 07/02/16 Potential to Achieve Goals: Fair    Frequency Min 3X/week   Barriers to discharge        Co-evaluation               End of Session Equipment Utilized During Treatment: Gait belt Activity Tolerance: Patient tolerated treatment well Patient left: in chair;with call bell/phone within reach Nurse Communication: Mobility status         Time: 1131-1151 PT Time Calculation (min) (ACUTE ONLY): 20 min   Charges:   PT Evaluation $PT Eval Moderate Complexity: 1 Procedure     PT G CodesClearnce Sorrel Jaquari Reckner 06/18/2016, 1:25 PM Sherie Don, [place], DPT [phone]

## 2016-06-19 LAB — BASIC METABOLIC PANEL
Anion gap: 10 (ref 5–15)
BUN: 31 mg/dL — AB (ref 6–20)
CALCIUM: 8.8 mg/dL — AB (ref 8.9–10.3)
CHLORIDE: 101 mmol/L (ref 101–111)
CO2: 30 mmol/L (ref 22–32)
CREATININE: 1.8 mg/dL — AB (ref 0.61–1.24)
GFR calc Af Amer: 39 mL/min — ABNORMAL LOW (ref >60)
GFR calc non Af Amer: 34 mL/min — ABNORMAL LOW (ref >60)
GLUCOSE: 91 mg/dL (ref 65–99)
Potassium: 3.6 mmol/L (ref 3.5–5.1)
Sodium: 141 mmol/L (ref 135–145)

## 2016-06-19 MED ORDER — AMIODARONE HCL IN DEXTROSE 360-4.14 MG/200ML-% IV SOLN
60.0000 mg/h | INTRAVENOUS | Status: DC
  Administered 2016-06-19 (×2): 60 mg/h via INTRAVENOUS
  Filled 2016-06-19 (×2): qty 200

## 2016-06-19 MED ORDER — AMIODARONE HCL IN DEXTROSE 360-4.14 MG/200ML-% IV SOLN
30.0000 mg/h | INTRAVENOUS | Status: DC
  Administered 2016-06-19 – 2016-06-20 (×3): 30 mg/h via INTRAVENOUS
  Filled 2016-06-19 (×2): qty 200

## 2016-06-19 MED ORDER — AMIODARONE LOAD VIA INFUSION
150.0000 mg | Freq: Once | INTRAVENOUS | Status: AC
  Administered 2016-06-19: 150 mg via INTRAVENOUS
  Filled 2016-06-19: qty 83.34

## 2016-06-19 MED ORDER — FUROSEMIDE 40 MG PO TABS
40.0000 mg | ORAL_TABLET | Freq: Two times a day (BID) | ORAL | Status: DC
  Administered 2016-06-19 (×2): 40 mg via ORAL
  Filled 2016-06-19 (×3): qty 1

## 2016-06-19 NOTE — Progress Notes (Addendum)
Patient Name: Thomas [name] Date of Encounter: 06/19/2016  Primary Cardiologist: H.W. B.Smith, III, M.D.  Hospital Problem List     Principal Problem:   Acute on chronic systolic HF (heart failure) (HCC) Active Problems:   Essential hypertension   Non-ischemic cardiomyopathy-EF 25-30%   Status post biventricular cardiac pacemaker procedure   Chronic renal insufficiency, stage III (moderate)   Coronary artery disease involving native coronary artery of native heart without angina pectoris   Parkinson disease ([place])    Subjective   Feeling better, decreased lower extremity swelling. Says he feels funny this am.  Tele shows tachycardia that is wide complex but irregular and likely afib with RVR and aberration  Inpatient Medications    Scheduled Meds: . amantadine  100 mg Oral BID  . aspirin EC  81 mg Oral Daily  . carbidopa-levodopa  1 tablet Oral TID  . clopidogrel  75 mg Oral Daily  . escitalopram  10 mg Oral Daily  . febuxostat  40 mg Oral Daily  . ferrous sulfate  325 mg Oral Daily  . fludrocortisone  0.1 mg Oral Once per day on Mon Wed Fri  . furosemide  80 mg Intravenous Q12H  . heparin  5,000 Units Subcutaneous Q8H  . methimazole  10 mg Oral Daily  . omega-3 acid ethyl esters  1 g Oral Daily  . potassium chloride SA  40 mEq Oral Daily  . pramipexole  0.5 mg Oral TID  . predniSONE  10 mg Oral Q breakfast  . rotigotine  1 patch Transdermal Daily  . sodium chloride flush  3 mL Intravenous Q12H  . vitamin C  500 mg Oral Daily   Continuous Infusions:  PRN Meds: sodium chloride, acetaminophen, ipratropium-albuterol, magnesium hydroxide, nitroGLYCERIN, ondansetron (ZOFRAN) IV, sodium chloride flush   Vital Signs    Vitals:   06/18/16 0531 06/18/16 1634 06/18/16 2114 06/19/16 0500  BP:  107/68 96/64 113/67  Pulse:  64 (!) 57 (!) 58  Resp: 18  18 18   Temp: 98 F (36.7 C)  97.9 F (36.6 C) 98 F (36.7 C)  TempSrc: Oral  Oral Oral  SpO2:   96% 98%  Weight: 195  lb 3.2 oz (88.5 kg)   191 lb 4.8 oz (86.8 kg)    Intake/Output Summary (Last 24 hours) at 06/19/16 0829 Last data filed at 06/19/16 0543  Gross per 24 hour  Intake              680 ml  Output             3325 ml  Net            -2645 ml   Filed Weights   06/17/16 0457 06/18/16 0531 06/19/16 0500  Weight: 196 lb 11.2 oz (89.2 kg) 195 lb 3.2 oz (88.5 kg) 191 lb 4.8 oz (86.8 kg)    Physical Exam  Elderly and frail. Has parkinsonism. GEN: Well nourished, well developed, in no acute distress.  HEENT: Grossly normal.  Neck: Supple, no JVD, carotid bruits, or masses. Cardiac: irregularly irregular and tachy, no murmurs, rubs, or gallops. No clubbing, cyanosis.  Radials/DP/PT revealed 2+ radials. Pedal pulses are difficult to palpate.Marland Kitchen He does have lichenification of the skin in both lower extremities from prior significant edema. Dramatic improvement in lower extremity edema compared to description on admission, and none on exam today Respiratory:  Respirations regular and unlabored, clear to auscultation bilaterally. GI: Soft, nontender, nondistended, BS + x 4. MS: no deformity or  atrophy. Skin: warm and dry, no rash. Neuro:  Strength and sensation are intact. Tremor. Slow speech. Psych: AAOx3.  Normal affect.  Labs    CBC No results for input(s): WBC, NEUTROABS, HGB, HCT, MCV, PLT in the last 72 hours. Basic Metabolic Panel  Recent Labs  06/18/16 0204 06/19/16 0255  NA 141 141  K 3.4* 3.6  CL 99* 101  CO2 30 30  GLUCOSE 102* 91  BUN 28* 31*  CREATININE 1.55* 1.80*  CALCIUM 8.6* 8.8*   Liver Function Tests No results for input(s): AST, ALT, ALKPHOS, BILITOT, PROT, ALBUMIN in the last 72 hours. No results for input(s): LIPASE, AMYLASE in the last 72 hours. Cardiac Enzymes No results for input(s): CKTOTAL, CKMB, CKMBINDEX, TROPONINI in the last 72 hours. BNP    Component Value Date/Time   BNP 532.4 (H) 06/15/2016 1657   BNP 219.7 (H) 05/31/2016 1439    ProBNP      Component Value Date/Time   PROBNP 375.0 (H) 12/03/2014 1557     Telemetry    Wide complex tachycardia - Personally Reviewed  ECG    No tracing was performed on admission. - Personally Reviewed  Radiology    No results found.  Cardiac Studies   No recent studies other than device interrogation in December which revealed stable Opti-vol. Last echo LVEF 06/17/15 was 30-35%.  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 After identification of coronary disease he underwent CHIP utilizing Impela hemodynamic support and rotational atherectomy and stenting of the LAD and DES stent in circumflex December 2015 with improvement in LVEF to 35% January 2017. Presents now with refractory lower extremity swelling and dyspnea despite escalation of outpatient diuretic regimen.  Assessment & Plan    1. Acute on chronic systolic heart failure manifested by more right than left heart failure. Significant diuresis thus far and improvement in clinical status.  -- Will continue diuretic therapy for now. Weight is down 20 pounds (211>>191 lbs), -2.6 L over the last 24 hours and net neg 9.7L. Plan to transition to oral diuretic today since he has had a bump in creatinine.  Management of heart failure has been limited by severe orthostasis and recurrent episodes of near syncope (possibly related to "Parkinsonism "-the Shy Drager syndrome) Orthostatics this yetserday were fine. -- Continue support stockings. May consider heart failure team evaluation. If we can support the blood pressure, would like to resume ARB and beta blocker therapy, though does remain soft today. -- lives alone, concern for independence. Will have PT eval as he is unsteady on his feet per nursing report.  2. Mixed  ischemic/nonischemic myopathy - most recent LVEF 35% January, 2017. Soft BP limits addition of BB or Hydralazine at this time.  Bradycardia limits addition of BB.  3. Coronary artery disease status post LAD and circumflex DES 2015. No evidence of myocardial infarction on this occasion  4. Chronic kidney disease, stage III. Despite aggressive diuresis, kidney function has remained stable.  5. Biventricular defibrillator  6. Hx of PAF. Appears to be back in afib with RVR this am.  HR in the 140's.  His ChADS2VASC score is 5 and he has not been on anticoagulation but I cannot find a reason why.  Will start Heparin gtt IV.  Will try to give IV Lopressor but if soft BP limits  this then may need to try IV Amio for rate control.  7.  Hypokalemia - repleated.  K 3.6 this am and he got 61meq of Kdur.  Will check Mg level.  7. 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  Continue Florinef for now but question if this is contributing to his CHF.  Consider addition of proamatine or mestinone  if orthostasis worsens.    Signed, Fransico Him, MD  06/19/2016, 8:29 AM

## 2016-06-19 NOTE — Progress Notes (Signed)
Pt has a 7 bits of Vtach per CCMD. The arhythmia is not sustained.  Pt is asymptomatic, we'll continue to monitor pt closely.

## 2016-06-19 NOTE — Progress Notes (Signed)
Pharmacy has reviewed this patient's chart for drug interactions with amiodarone.  Caution should be used because amiodarone, escitalopram, and ondansetron can all have QT prolonging effects. Recommend checking regular EKGs.  Thanks!   Uvaldo Bristle, PharmD PGY1 Pharmacy Resident Pager: [phone]

## 2016-06-20 LAB — BASIC METABOLIC PANEL
Anion gap: 10 (ref 5–15)
BUN: 37 mg/dL — AB (ref 6–20)
CO2: 31 mmol/L (ref 22–32)
CREATININE: 1.91 mg/dL — AB (ref 0.61–1.24)
Calcium: 8.9 mg/dL (ref 8.9–10.3)
Chloride: 100 mmol/L — ABNORMAL LOW (ref 101–111)
GFR calc Af Amer: 37 mL/min — ABNORMAL LOW (ref >60)
GFR, EST NON AFRICAN AMERICAN: 32 mL/min — AB (ref >60)
GLUCOSE: 99 mg/dL (ref 65–99)
Potassium: 3.8 mmol/L (ref 3.5–5.1)
SODIUM: 141 mmol/L (ref 135–145)

## 2016-06-20 LAB — MAGNESIUM: MAGNESIUM: 2.6 mg/dL — AB (ref 1.7–2.4)

## 2016-06-20 MED ORDER — MIDODRINE HCL 5 MG PO TABS
5.0000 mg | ORAL_TABLET | Freq: Two times a day (BID) | ORAL | Status: DC
  Administered 2016-06-20 – 2016-06-21 (×3): 5 mg via ORAL
  Filled 2016-06-20 (×3): qty 1

## 2016-06-20 MED ORDER — ROTIGOTINE 6 MG/24HR TD PT24
1.0000 | MEDICATED_PATCH | TRANSDERMAL | Status: DC
  Administered 2016-06-20: 1 via TRANSDERMAL
  Filled 2016-06-20 (×2): qty 1

## 2016-06-20 MED ORDER — FUROSEMIDE 40 MG PO TABS
40.0000 mg | ORAL_TABLET | Freq: Two times a day (BID) | ORAL | Status: DC
  Administered 2016-06-20 – 2016-06-21 (×3): 40 mg via ORAL
  Filled 2016-06-20 (×3): qty 1

## 2016-06-20 NOTE — Care Management Important Message (Signed)
Important Message  Patient Details  Name: Thomas [name] MRN: [id number] Date of Birth: September 13, 1933   Medicare Important Message Given:  Yes    Nathen May 06/20/2016, 12:48 PM

## 2016-06-20 NOTE — Progress Notes (Signed)
Patient Name: Thomas [name] Date of Encounter: 06/20/2016  Primary Cardiologist: H.W. B.Smith, III, M.D.  Hospital Problem List     Principal Problem:   Acute on chronic systolic HF (heart failure) (HCC) Active Problems:   Essential hypertension   Non-ischemic cardiomyopathy-EF 25-30%   Status post biventricular cardiac pacemaker procedure   Chronic renal insufficiency, stage III (moderate)   Coronary artery disease involving native coronary artery of native heart without angina pectoris   Parkinson disease ([place])    Subjective   Feels better, breathing is better. Working with PT who recommend SNF but patient refusing at this time.  Inpatient Medications    Scheduled Meds: . amantadine  100 mg Oral BID  . aspirin EC  81 mg Oral Daily  . carbidopa-levodopa  1 tablet Oral TID  . clopidogrel  75 mg Oral Daily  . escitalopram  10 mg Oral Daily  . febuxostat  40 mg Oral Daily  . ferrous sulfate  325 mg Oral Daily  . fludrocortisone  0.1 mg Oral Once per day on Mon Wed Fri  . furosemide  40 mg Oral BID  . heparin  5,000 Units Subcutaneous Q8H  . methimazole  10 mg Oral Daily  . omega-3 acid ethyl esters  1 g Oral Daily  . potassium chloride SA  40 mEq Oral Daily  . pramipexole  0.5 mg Oral TID  . predniSONE  10 mg Oral Q breakfast  . rotigotine  1 patch Transdermal Daily  . sodium chloride flush  3 mL Intravenous Q12H  . vitamin C  500 mg Oral Daily   Continuous Infusions: . amiodarone 30 mg/hr (06/19/16 2251)   PRN Meds: sodium chloride, acetaminophen, ipratropium-albuterol, magnesium hydroxide, nitroGLYCERIN, ondansetron (ZOFRAN) IV, sodium chloride flush   Vital Signs    Vitals:   06/19/16 1600 06/19/16 1900 06/20/16 0300 06/20/16 0513  BP: 104/64 (!) 105/56  110/64  Pulse: (!) 59 (!) 58  (!) 112  Resp: 19 18  18   Temp: 97.6 F (36.4 C) 97.7 F (36.5 C)  97.8 F (36.6 C)  TempSrc: Oral Oral  Oral  SpO2:      Weight:   191 lb 4.8 oz (86.8 kg)      Intake/Output Summary (Last 24 hours) at 06/20/16 0808 Last data filed at 06/20/16 0524  Gross per 24 hour  Intake           838.85 ml  Output             1600 ml  Net          -761.15 ml   Filed Weights   06/18/16 0531 06/19/16 0500 06/20/16 0300  Weight: 195 lb 3.2 oz (88.5 kg) 191 lb 4.8 oz (86.8 kg) 191 lb 4.8 oz (86.8 kg)    Physical Exam  Elderly and frail. Has parkinsonism. GEN: Well nourished, well developed, in no acute distress.  HEENT: Grossly normal.  Neck: Supple, no JVD, carotid bruits, or masses. Cardiac: RRR, no murmurs, rubs, or gallops. No clubbing, cyanosis.  Radials/DP/PT revealed 2+ radials. Pedal pulses are difficult to palpate.Marland Kitchen He does have lichenification of the skin in both lower extremities from prior significant edema. Dramatic improvement in lower extremity edema compared to description on admission, and none on exam today Respiratory:  Respirations regular and unlabored, clear to auscultation bilaterally. GI: Soft, nontender, nondistended, BS + x 4. MS: no deformity or atrophy. Skin: warm and dry, no rash. Neuro:  Strength and sensation are intact. Tremor. Slow  speech. Psych: AAOx3.  Normal affect.  Labs    CBC No results for input(s): WBC, NEUTROABS, HGB, HCT, MCV, PLT in the last 72 hours. Basic Metabolic Panel  Recent Labs  06/19/16 0255 06/20/16 0418  NA 141 141  K 3.6 3.8  CL 101 100*  CO2 30 31  GLUCOSE 91 99  BUN 31* 37*  CREATININE 1.80* 1.91*  CALCIUM 8.8* 8.9  MG  --  2.6*   Liver Function Tests No results for input(s): AST, ALT, ALKPHOS, BILITOT, PROT, ALBUMIN in the last 72 hours. No results for input(s): LIPASE, AMYLASE in the last 72 hours. Cardiac Enzymes No results for input(s): CKTOTAL, CKMB, CKMBINDEX, TROPONINI in the last 72 hours. BNP    Component Value Date/Time   BNP 532.4 (H) 06/15/2016 1657   BNP 219.7 (H) 05/31/2016 1439    ProBNP    Component Value Date/Time   PROBNP 375.0 (H) 12/03/2014 1557      Telemetry    AV paced rhythm, rate 60-70s- Personally Reviewed  ECG    06/19/16 WQT, AF with aberrancy? - Personally Reviewed  Radiology    No results found.  Cardiac Studies   TTE: 06/17/16  Study Conclusions  - Left ventricle: Diffuse hypokinesis worse in the sepum apex and   inferior wall. The cavity size was severely dilated. There was   mild focal basal hypertrophy of the septum. The estimated   ejection fraction was 25%. - Mitral valve: There was mild regurgitation. - Left atrium: The atrium was moderately dilated. - Atrial septum: No defect or patent foramen ovale was identified.   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 After identification of coronary disease he underwent CHIP utilizing Impela hemodynamic support and rotational atherectomy and stenting of the LAD and DES stent in circumflex December 2015 with improvement in LVEF to 35% January 2017. Presents now with refractory lower extremity swelling and dyspnea despite escalation of outpatient diuretic regimen.  Assessment & Plan    1. Acute on chronic systolic heart failure manifested by more right than left heart failure. Significant diuresis thus far and improvement in clinical status.  -- Will continue diuretic therapy for now. Weight is down 20 pounds (211>>191 lbs), -1.6 L over the last 24 hours and net neg 10.4L. Transitioned to oral diuretic yesterday since he has had a bump in creatinine.  Management of heart failure has been limited by severe orthostasis and recurrent episodes of near syncope (possibly related to "Parkinsonism "-the Shy Drager syndrome) Orthostatics this yetserday were fine. -- Continue support stockings. May consider heart failure team evaluation. If we can support the  blood pressure, would like to resume ARB and beta blocker therapy, though does remain soft today. -- lives alone, concern for independence. PT following and recommend SNF but patient refusing at this time.  2. Mixed ischemic/nonischemic myopathy - most recent LVEF 25% this admission. Blood pressure runs soft. Would benefit from the addition of BB, hydralazine, nitrate given further reduction in EF.   3. Coronary artery disease status post LAD and circumflex DES 2015. No evidence of myocardial infarction on this occasion  4. Chronic kidney disease, stage III. Cr has started to rise, 1.5>>1.8>>1.9 today. Will hold morning dose of lasix given rise in Cr.   5. Biventricular defibrillator  6. Hx of PAF. Appears to have  been in AF RVR HR in the 140's yesterday.  His ChADS2VASC score is 5 and he has not been on anticoagulation in the past. Presume this may be related to risk of falls, but is not documented. Was started on IV amiodarone yesterday, appears to be back in SR this morning with AV pacing on telemetry. Defer to MD regarding the initiation of anticoagulation, per nursing and PT reports he is very unsteady on his feet.   -- Check EKG for QT measurement today.  7.  Hypokalemia - resolved.  7. 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  Continue Florinef for now but question if this is contributing to his CHF.  Consider addition of proamatine or mestinone  if orthostasis worsens.    Signed, Reino Bellis, NP  06/20/2016, 8:08 AM

## 2016-06-20 NOTE — Progress Notes (Signed)
Clinical Social Worker met patient at bedside to offer support and discuss patients needs at discharge. Patient stated he does not want to go to a SNF. Patient stated "if Pt is going to see him for only 30 minutes at a SNF , then they can come to his home and do that". Patient stated he lives at home by himself but does have his son and daughter near by to help patient at home. Patient aware that home health will not give him the 24 hour care that is needed but patient states he will be fine at home.  CSW is signing off at this time.  Rhea Pink, MSW,  [place]

## 2016-06-20 NOTE — Progress Notes (Signed)
Physical Therapy Treatment Patient Details Name: Thomas [name] MRN: [id number] DOB: 1934-02-19 Today's Date: 06/20/2016    History of Present Illness Pt is an 83 y/o male admitted secondary to LE edema with CHF exacerbation. PMH including but not limited to CAD, HTN, Parkinson's and s/p biventricular cardiac pacemaker placement.     PT Comments    Pt with improved ambulation tolerance but con't to be at increased falls risk as pt with decreased strength, impaired balance, and shuffled gait pattern. Pt unsafe to return home alone however pt adamantly refusing SNF despite education on only needing to stay for 10-14 days to regain PLOF. Pt reports "i can do all my therapy in my work out room at home. Agreeable to HHPT. Spoke with daughter who reports herself and brother can not stay with patient as her husband is having liver surgery this week and her brother is working up in [facility]. Will discuss with case management and CSW.   Follow Up Recommendations  SNF (pt adamantly refusing SNF and wants HHPT)     Equipment Recommendations  None recommended by PT    Recommendations for Other Services       Precautions / Restrictions Precautions Precautions: Fall Restrictions Weight Bearing Restrictions: No    Mobility  Bed Mobility               General bed mobility comments: pt up in chair upon arrival  Transfers Overall transfer level: Needs assistance Equipment used: Rolling walker (2 wheeled) Transfers: Sit to/from Stand Sit to Stand: Min assist         General transfer comment: increased time, used rocking/momentum, pushed up from arm rests  Ambulation/Gait Ambulation/Gait assistance: Min guard Ambulation Distance (Feet): 150 Feet Assistive device: Rolling walker (2 wheeled) Gait Pattern/deviations: Step-through pattern;Decreased stride length;Shuffle Gait velocity: decreased Gait velocity interpretation: Below normal speed for age/gender General Gait  Details: pt with improved step height and length as walking progressed   Stairs            Wheelchair Mobility    Modified Rankin (Stroke Patients Only)       Balance Overall balance assessment: Needs assistance Sitting-balance support: Feet supported Sitting balance-Leahy Scale: Fair     Standing balance support: During functional activity;Bilateral upper extremity supported Standing balance-Leahy Scale: Poor Standing balance comment: requires RW                    Cognition Arousal/Alertness: Awake/alert Behavior During Therapy: WFL for tasks assessed/performed Overall Cognitive Status: Within Functional Limits for tasks assessed                 General Comments: pt adamant about staying at home alone, decreased safety    Exercises      General Comments General comments (skin integrity, edema, etc.): discussed at length regarding benefits from SNF for 7-10 to regain strength and balance      Pertinent Vitals/Pain Pain Assessment: No/denies pain    Home Living                      Prior Function            PT Goals (current goals can now be found in the care plan section) Acute Rehab PT Goals Patient Stated Goal: return home Progress towards PT goals: Progressing toward goals    Frequency    Min 3X/week      PT Plan Current plan remains appropriate    Co-evaluation  End of Session Equipment Utilized During Treatment: Gait belt Activity Tolerance: Patient tolerated treatment well Patient left: in chair;with call bell/phone within reach     Time: 0740-0759 PT Time Calculation (min) (ACUTE ONLY): 19 min  Charges:  $Gait Training: 8-22 mins                    G Codes:      Koleen Distance Jeramine Delis Jul 18, 2016, 8:40 AM   Kittie Plater, PT, DPT Pager #: [phone] Office #: [phone]

## 2016-06-20 NOTE — Care Management Note (Signed)
Case Management Note Marvetta Gibbons RN, BSN Unit 2W-Case Manager [phone]  Patient Details  Name: Jonithan Medearis MRN: [id number] Date of Birth: [birth year]  Subjective/Objective:  Pt admitted with acute on Chronic HF- vol. overload                  Action/Plan: PTA pt lived at home alone- CM to follow for d/c needs  Expected Discharge Date:                  Expected Discharge Plan:  [place]  In-House Referral:     Discharge planning Services  CM Consult  Post Acute Care Choice:    Choice offered to:     DME Arranged:    DME Agency:     HH Arranged:    HH Agency:     Status of Service:  In process, will continue to follow  If discussed at Long Length of Stay Meetings, dates discussed:    Additional Comments:  06/20/16- 1430- Auston Halfmann RN, CM- PT eval recommending STSNF- pt adamantly refusing - states he wants to return home- Cm spoke with pt at bedside- discussed discharge recommendations - pt still states he wants to go home- states he understands his daughter will not be able to assist him much this time as her husband due to have surgery this week. Discussed STSNF option- pt politely states he does not want to go to a rehab place. Reports that he has cane, RW, w/c and electric w/c at home. Has had [facility] in past but does not remember who- lives in [facility]- address [address] [place] [address] - phone [phone], when asked who will take him home pt states his son [name] will. Call made to pt's daughter- [name]- who via TC is concerned about pt going home alone as she will not be able to assist as she has in the past- daughter understands that we can not "force" pt to go to rehab- pt has to make that choice on his own. Daughter reports that pt's son is to come speak with pt later today about d/c plans. Per daughter any HH agency except [facility] would be acceptable- she can not remember who pt has used either.  CM to  follow up with pt for [facility] needs after son visits.   Dawayne Patricia, RN 06/20/2016, 3:11 PM

## 2016-06-21 ENCOUNTER — Telehealth: Payer: Self-pay | Admitting: Cardiology

## 2016-06-21 DIAGNOSIS — I110 Hypertensive heart disease with heart failure: Secondary | ICD-10-CM

## 2016-06-21 DIAGNOSIS — I480 Paroxysmal atrial fibrillation: Secondary | ICD-10-CM

## 2016-06-21 DIAGNOSIS — I951 Orthostatic hypotension: Secondary | ICD-10-CM

## 2016-06-21 LAB — BASIC METABOLIC PANEL
Anion gap: 10 (ref 5–15)
BUN: 34 mg/dL — AB (ref 6–20)
CHLORIDE: 102 mmol/L (ref 101–111)
CO2: 28 mmol/L (ref 22–32)
Calcium: 8.9 mg/dL (ref 8.9–10.3)
Creatinine, Ser: 1.6 mg/dL — ABNORMAL HIGH (ref 0.61–1.24)
GFR calc non Af Amer: 39 mL/min — ABNORMAL LOW (ref >60)
GFR, EST AFRICAN AMERICAN: 45 mL/min — AB (ref >60)
Glucose, Bld: 106 mg/dL — ABNORMAL HIGH (ref 65–99)
Potassium: 3.6 mmol/L (ref 3.5–5.1)
Sodium: 140 mmol/L (ref 135–145)

## 2016-06-21 MED ORDER — FUROSEMIDE 40 MG PO TABS: 40.0000 mg | ORAL_TABLET | Freq: Two times a day (BID) | ORAL | 2 refills | Status: AC

## 2016-06-21 MED ORDER — MIDODRINE HCL 5 MG PO TABS: 5.0000 mg | ORAL_TABLET | Freq: Two times a day (BID) | ORAL | 3 refills | Status: AC

## 2016-06-21 NOTE — Care Management Note (Signed)
Case Management Note Marvetta Gibbons RN, BSN Unit 2W-Case Manager [phone]  Patient Details  Name: Thomas [name] MRN: [id number] Date of Birth: July 29, 1933  Subjective/Objective:  Pt admitted with acute on Chronic HF- vol. overload                  Action/Plan: PTA pt lived at home alone- CM to follow for d/c needs  Expected Discharge Date:     06/21/16             Expected Discharge Plan:  [place]  In-House Referral:     Discharge planning Services  CM Consult  Post Acute Care Choice:  Home Health Choice offered to:  Patient, Adult Children  DME Arranged:    DME Agency:     HH Arranged:  RN, PT, Nurse's Aide, Social Work [place] Agency:  [address]  Status of Service:  Completed, signed off  If discussed at [employer] of Stay Meetings, dates discussed:  1/9  Discharge Disposition: home with home health   Additional Comments:  06/21/16- 1530- Marvetta Gibbons RN, CM- pt for d/c home today- call made to several [facility] agencies for service- [facility]- [phone]) can provide needed services with start of care on 06/22/16- referral given to [place] with [facility]- needed orders and paperwork faxed to [facility] care- at [phone]- via epic.   06/20/16- 1430- Leler Brion RN, CM- PT eval recommending STSNF- pt adamantly refusing - states he wants to return home- Cm spoke with pt at bedside- discussed discharge recommendations - pt still states he wants to go home- states he understands his daughter will not be able to assist him much this time as her husband due to have surgery this week. Discussed STSNF option- pt politely states he does not want to go to a rehab place. Reports that he has cane, RW, w/c and electric w/c at home. Has had [facility] in past but does not remember who- lives in [facility]- address [address] [place] [address] - phone [phone], when asked who will take him home pt states his son  [name] will. Call made to pt's daughter- [name]- who via TC is concerned about pt going home alone as she will not be able to assist as she has in the past- daughter understands that we can not "force" pt to go to rehab- pt has to make that choice on his own. Daughter reports that pt's son is to come speak with pt later today about d/c plans. Per daughter any HH agency except [facility] would be acceptable- she can not remember who pt has used either.  CM to follow up with pt for [employer] needs after son visits.   Dawayne Patricia, RN 06/21/2016, 3:54 PM

## 2016-06-21 NOTE — Progress Notes (Signed)
Pt ambulated from room around nurses station (the circle) and down the "3" hall and back to his room with no dizziness. Will continue to monitor. Cato Mulligan RN

## 2016-06-21 NOTE — Discharge Summary (Signed)
Discharge Summary    Patient ID: Thomas [name],  MRN: [id number], DOB/AGE: [birth year] 83 y.o.  Admit date: 06/15/2016 Discharge date: 06/21/2016  Primary Care Provider: [facility] Primary Cardiologist: Dr. Tamala Julian EP: Dr. Caryl Comes  Discharge Diagnoses    Principal Problem:   Acute on chronic systolic HF (heart failure) [facility]) Active Problems:   Essential hypertension   Non-ischemic cardiomyopathy-EF 25-30%   Status post biventricular cardiac pacemaker procedure   Chronic renal insufficiency, stage III (moderate)   Coronary artery disease involving native coronary artery of native heart without angina pectoris   Parkinson disease (HCC)   Orthostatic hypotension   Hypertensive heart disease with heart failure (HCC)   Paroxysmal atrial fibrillation (HCC)   Allergies Allergies  Allergen Reactions  . Ciprofloxacin Other (See Comments)    "PER MD"  . Levaquin [Levofloxacin] Other (See Comments)    REACTION: "OUT OF HEAD/ SEIZURES"    Diagnostic Studies/Procedures    Echo 06/17/16 LV EF: 25%  ------------------------------------------------------------------- Indications: CHF - 428.0.  ------------------------------------------------------------------- History: PMH: Coronary artery disease. PMH: Non-ischemic Cardiomyopathy. Myocardial infarction. Risk factors: Current tobacco use. Hypertension.  ------------------------------------------------------------------- Study Conclusions  - Left ventricle: Diffuse hypokinesis worse in the sepum apex and inferior wall. The cavity size was severely dilated. There was mild focal basal hypertrophy of the septum. The estimated ejection fraction was 25%. - Mitral valve: There was mild regurgitation. - Left atrium: The atrium was moderately dilated. - Atrial septum: No defect or patent foramen ovale was identified.   History of Present Illness     Thomas [name] is an [id number] with systolic and diastolic heart  failure (LVEF 25-30%), with prior CHIP LAD and circumflex within Impela support, orthostatic hypotension on Florinef, history of atrial fibrillation, Parkinson's disease, HTN, hyperthyroidism, and polymorphicVT s/p CRT-D (2012) came to ER with Le edema 06/16/15 and admitted for acute on chronic heart failure.  On the visit on the Dec 19th 2017 "optival interrogated which showed no afib. Impedance is near baseline. No CP or worsening SOB. No orthopnea or PND. No dizziness or syncope. No blood in stool or urine. No palpitations. His LE edema has stayed the same and had some weeping. Also legs are very red and hot. No pain. No fevers or chills. " Dr. Tamala Julian saw patient with PA on the 19th andrecommended metolazone 2.5mg  30 minutes before lasix 80mg  tomorrow AMfollowed by lasix 40mg  BID. He will take another metolazone 2.5mg  30 minutes before lasix 40mg  the following day as well. StartedKeflex 500mg  BID x 7days.   However due to elevated edema and worsening edema he presented to ER for further evaluation. Lower extremity swelling is present but redness has resolved.   Hospital Course     Consultants: None   1. Acute on chronic systolic heart failure The patient admitted to IV diuresis with good response. 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 Applied compression stocking. Lasix hold for few doses due to elevated creatinine with improvement. Kidney function remained stable with resumption of lasix. Total diuresis of negative 10.8L. Weight down 12lb (206-->194lb). Not on ACE/ARB  And BB due to severe orthostatic hypotension and soft low BP. Plan to add as outpatient. BEMT during TEM.   2. Mix ischemic/non ischemic cardiomyopathy - Ef of 25% on echo this admission. Ef was 30-35% on echo 06/2015. Consider adding BB and ACE/ARB or hydralazine/imdur as outpatient.   3.  Parkinsonism vs Shy  Drager  - Discontinue Florinef as it might contributing CHF. Added midodrine 0.5mg  BID. If this does not work, consider Northera for orthostatic hypotension given the concern for underlying Parkinsonism and autonomic dysfunction. Will follow up with PCP.   4. Hx of PAF s/p BIV ICD - Paced rhythm. Discontinued amiodarone as he maintained sinus rhythm. Not on anticoagulation due to all risk. His ChADS2VASC score is 5.  5. Acute on CKD, Stage III - Scr improved with diuresis. BEMT during follow up.   6. CAD s/p DES to LAD and Cx - No active angina.   7. Social situation - PT recommended SNF. However, pt declined. States daughter and son lives near by. Discharge with HHPT/OT/RN and Aid.    Ambulated well prior to discharge without dyspnea or dizziness.    The patient has been seen by Dr. Oval Linsey today and deemed ready for discharge home. All follow-up appointments have been scheduled. Discharge medications are listed below.    Discharge Vitals Blood pressure 114/62, pulse (!) 55, temperature 98.6 F (37 C), temperature source Oral, resp. rate 17, weight 194 lb (88 kg), SpO2 98 %.  Filed Weights   06/19/16 0500 06/20/16 0300 06/21/16 0500  Weight: 191 lb 4.8 oz (86.8 kg) 191 lb 4.8 oz (86.8 kg) 194 lb (88 kg)    Labs & Radiologic Studies     CBC No results for input(s): WBC, NEUTROABS, HGB, HCT, MCV, PLT in the last 72 hours. Basic Metabolic Panel  Recent Labs  06/20/16 0418 06/21/16 0330  NA 141 140  K 3.8 3.6  CL 100* 102  CO2 31 28  GLUCOSE 99 106*  BUN 37* 34*  CREATININE 1.91* 1.60*  CALCIUM 8.9 8.9  MG 2.6*  --    Liver Function Tests No results for input(s): AST, ALT, ALKPHOS, BILITOT, PROT, ALBUMIN in the last 72 hours. No results for input(s): LIPASE, AMYLASE in the last 72 hours. Cardiac Enzymes No results for input(s): CKTOTAL, CKMB, CKMBINDEX, TROPONINI in the last 72 hours. BNP Invalid input(s): POCBNP D-Dimer No results for  input(s): DDIMER in the last 72 hours. Hemoglobin A1C No results for input(s): HGBA1C in the last 72 hours. Fasting Lipid Panel No results for input(s): CHOL, HDL, LDLCALC, TRIG, CHOLHDL, LDLDIRECT in the last 72 hours. Thyroid Function Tests No results for input(s): TSH, T4TOTAL, T3FREE, THYROIDAB in the last 72 hours.  Invalid input(s): FREET3  Dg Chest Port 1 View  Result Date: 06/15/2016 CLINICAL DATA:  Shortness of breath with lower extremity edema EXAM: PORTABLE CHEST 1 VIEW COMPARISON:  March 05, 2015 FINDINGS: There is interstitial edema. No airspace consolidation. There is cardiomegaly with pulmonary venous hypertension. Defibrillator leads are attached to the right atrium, right ventricle, and left ventricle. There is atherosclerotic calcification in the aorta. No adenopathy. No bone lesions. IMPRESSION: Stable defibrillator lead positions. Congestive heart failure. Aortic atherosclerosis. Electronically Signed   By: Lowella Grip III M.D.   On: 06/15/2016 21:41    Disposition   Pt is being discharged home today in good condition.  Follow-up Plans & Appointments    Follow-up Information    Cecilie Kicks, NP. Go on 06/28/2016.   Specialties:  Cardiology, Radiology Why:  @3pm  for TCM Contact information: [address] [place] [address] [phone]        [facility], MD Follow up.   Specialty:  Family Medicine Why:  for hospital follow up Contact information: [address] [place] [address] [phone]  Discharge Instructions    Diet - low sodium heart healthy    Complete by:  As directed    Increase activity slowly    Complete by:  As directed       Discharge Medications   Current Discharge Medication List    START taking these medications   Details  midodrine (PROAMATINE) 5 MG tablet Take 1 tablet (5 mg total) by mouth 2 (two) times daily with a meal. Qty: 60 tablet, Refills: 3      CONTINUE these medications which  have CHANGED   Details  furosemide (LASIX) 40 MG tablet Take 1 tablet (40 mg total) by mouth 2 (two) times daily. Qty: 60 tablet, Refills: 2      CONTINUE these medications which have NOT CHANGED   Details  acetaminophen (TYLENOL) 325 MG tablet Take 650 mg by mouth every 4 (four) hours as needed for mild pain.    amantadine (SYMMETREL) 100 MG capsule Take 100 mg by mouth 2 (two) times daily.     aspirin EC 81 MG EC tablet Take 1 tablet (81 mg total) by mouth daily.    carbidopa-levodopa (SINEMET IR) 25-100 MG per tablet Take 1 tablet by mouth 3 (three) times daily. Qty: 30 tablet, Refills: 0    clopidogrel (PLAVIX) 75 MG tablet Take 1 tablet (75 mg total) by mouth daily. Qty: 90 tablet, Refills: 0    escitalopram (LEXAPRO) 10 MG tablet Take 10 mg by mouth daily.    Ferrous Sulfate (IRON) 325 (65 Fe) MG TABS Take 1 tablet by mouth daily.    ipratropium-albuterol (DUONEB) 0.5-2.5 (3) MG/3ML SOLN every 6 (six) hours as needed (wheezing).  Refills: 1    methimazole (TAPAZOLE) 10 MG tablet Take 1 tablet (10 mg total) by mouth daily. Qty: 30 tablet, Refills: 0    Multiple Vitamins-Minerals (ICAPS PO) Take 1 tablet by mouth 2 (two) times daily.    nitroGLYCERIN (NITROSTAT) 0.4 MG SL tablet Place 0.4 mg under the tongue every 5 (five) minutes as needed for chest pain.     Omega-3 Fatty Acids (FISH OIL) 1000 MG CAPS Take one by mouth twice daily      OVER THE COUNTER MEDICATION Take 1 tablet by mouth 2 (two) times daily. Med Name: PROSVENT    pantoprazole (PROTONIX) 20 MG tablet Take 20 mg by mouth daily.    potassium chloride SA (K-DUR,KLOR-CON) 20 MEQ tablet Take 20 mEq by mouth daily.    pramipexole (MIRAPEX) 0.5 MG tablet Take 0.5 mg by mouth 3 (three) times daily.    rotigotine (NEUPRO) 6 MG/24HR Place 1 patch onto the skin daily.    ULORIC 40 MG tablet Take 40 mg by mouth daily.    vitamin C (ASCORBIC ACID) 500 MG tablet Take 500 mg by mouth daily.    predniSONE  (DELTASONE) 10 MG tablet Take 10 mg by mouth every morning.      STOP taking these medications     fludrocortisone (FLORINEF) 0.1 MG tablet      metolazone (ZAROXOLYN) 2.5 MG tablet           Outstanding Labs/Studies   BMET during TCM  Duration of Discharge Encounter   Greater than 30 minutes including physician time.  Signed, Jiovani Mccammon PA-C 06/21/2016, 3:50 PM

## 2016-06-21 NOTE — Progress Notes (Signed)
Patient Name: Thomas [name] Date of Encounter: 06/21/2016  Primary Cardiologist: Dr. Laurena Bering Problem List     Principal Problem:   Acute on chronic systolic HF (heart failure) ([place]) Active Problems:   Essential hypertension   Non-ischemic cardiomyopathy-EF 25-30%   Status post biventricular cardiac pacemaker procedure   Chronic renal insufficiency, stage III (moderate)   Coronary artery disease involving native coronary artery of native heart without angina pectoris   Parkinson disease ([place])     Subjective  Feeling well. No chest pain, sob or palpitations. Wants to go home.   Inpatient Medications    Scheduled Meds: . amantadine  100 mg Oral BID  . aspirin EC  81 mg Oral Daily  . carbidopa-levodopa  1 tablet Oral TID  . clopidogrel  75 mg Oral Daily  . escitalopram  10 mg Oral Daily  . febuxostat  40 mg Oral Daily  . ferrous sulfate  325 mg Oral Daily  . furosemide  40 mg Oral BID  . heparin  5,000 Units Subcutaneous Q8H  . methimazole  10 mg Oral Daily  . midodrine  5 mg Oral BID WC  . omega-3 acid ethyl esters  1 g Oral Daily  . potassium chloride SA  40 mEq Oral Daily  . pramipexole  0.5 mg Oral TID  . predniSONE  10 mg Oral Q breakfast  . rotigotine  1 patch Transdermal Q24H  . sodium chloride flush  3 mL Intravenous Q12H  . vitamin C  500 mg Oral Daily   Continuous Infusions:  PRN Meds: sodium chloride, acetaminophen, ipratropium-albuterol, magnesium hydroxide, nitroGLYCERIN, ondansetron (ZOFRAN) IV, sodium chloride flush   Vital Signs    Vitals:   06/20/16 1300 06/20/16 2009 06/21/16 0500 06/21/16 0541  BP: 113/64 (!) 102/57  113/68  Pulse: 60 60  60  Resp: 17 18    Temp: 98.2 F (36.8 C) 97.3 F (36.3 C)  97.3 F (36.3 C)  TempSrc: Oral Oral  Oral  SpO2: 98% 100%  98%  Weight:   194 lb (88 kg)     Intake/Output Summary (Last 24 hours) at 06/21/16 0735 Last data filed at 06/20/16 2011  Gross per 24 hour  Intake              480 ml    Output              851 ml  Net             -371 ml   Filed Weights   06/19/16 0500 06/20/16 0300 06/21/16 0500  Weight: 191 lb 4.8 oz (86.8 kg) 191 lb 4.8 oz (86.8 kg) 194 lb (88 kg)    Physical Exam    GEN: Elderly frail male  in no acute distress.  HEENT: Grossly normal.  Neck: Supple, no JVD, carotid bruits, or masses. Cardiac: RRR, no murmurs, rubs, or gallops. No clubbing, cyanosis, edema.  Radials2+ and equal bilaterally. does have lichenification of the skin in both lower extremities from prior significant edema. Respiratory:  Respirations regular and unlabored, clear to auscultation bilaterally. GI: Soft, nontender, nondistended, BS + x 4. MS: no deformity or atrophy. Skin: warm and dry, no rash. Neuro:  Strength and sensation are intact. Psych: AAOx3.  Normal affect.  Labs    CBC No results for input(s): WBC, NEUTROABS, HGB, HCT, MCV, PLT in the last 72 hours. Basic Metabolic Panel  Recent Labs  06/20/16 0418 06/21/16 0330  NA 141 140  K 3.8  3.6  CL 100* 102  CO2 31 28  GLUCOSE 99 106*  BUN 37* 34*  CREATININE 1.91* 1.60*  CALCIUM 8.9 8.9  MG 2.6*  --    Liver Function Tests No results for input(s): AST, ALT, ALKPHOS, BILITOT, PROT, ALBUMIN in the last 72 hours. No results for input(s): LIPASE, AMYLASE in the last 72 hours. Cardiac Enzymes No results for input(s): CKTOTAL, CKMB, CKMBINDEX, TROPONINI in the last 72 hours. BNP Invalid input(s): POCBNP D-Dimer No results for input(s): DDIMER in the last 72 hours. Hemoglobin A1C No results for input(s): HGBA1C in the last 72 hours. Fasting Lipid Panel No results for input(s): CHOL, HDL, LDLCALC, TRIG, CHOLHDL, LDLDIRECT in the last 72 hours. Thyroid Function Tests No results for input(s): TSH, T4TOTAL, T3FREE, THYROIDAB in the last 72 hours.  Invalid input(s): FREET3  Telemetry    Paced rhythm with PVCs - Personally Reviewed  ECG    N/A- Personally Reviewed  Radiology    No results  found.  Cardiac Studies   Echo 06/17/16 LV EF: 25%  ------------------------------------------------------------------- Indications:      CHF - 428.0.  ------------------------------------------------------------------- History:   PMH:   Coronary artery disease.  PMH:  Non-ischemic Cardiomyopathy.  Myocardial infarction.  Risk factors:  Current tobacco use. Hypertension.  ------------------------------------------------------------------- Study Conclusions  - Left ventricle: Diffuse hypokinesis worse in the sepum apex and   inferior wall. The cavity size was severely dilated. There was   mild focal basal hypertrophy of the septum. The estimated   ejection fraction was 25%. - Mitral valve: There was mild regurgitation. - Left atrium: The atrium was moderately dilated. - Atrial septum: No defect or patent foramen ovale was identified.  Patient Profile     [name] is an [id number] with systolic and diastolic heart failure (LVEF 25-30%), s/p BiV ICD, CKD III, Parkinson's disease and CAD here with acute on chronic heart failure.   Assessment & Plan    1. Acute on chronic systolic heart failure - Total diuresis of negative 10.8L. Weight down 12lb (206-->194lb). Kidney function improved with resumption of lasix.  - Not on ACE/ARB  And BB due to severe orthostatic hypotension and soft low BP.   2. Mix ischemic/non ischemic cardiomyopathy - Ef of 25% on echo this admission. Consider adding BB and ACE/ARB or hydralazine/imdur once stable BB.   3. Parkinsonism vs Shy Drager  - Discontinue Florinef as it might contributing CHF. Continue midodrine 0.5mg  BID. Check orthostatic vital today.   4. Hx of PAF s/p BIV ICD - Paced rhythm. Discontinued amiodarone. Not on anticoagulation due to all risk. His ChADS2VASC score is 5.  5. Acute on CKD, Stage III - Scr improved with diuresis today.   6. CAD s/p DES to LAD and Cx - No active angina.   7. Social situation - PT recommended SNF.  However, pt declined. States daughter and son lives near by. Like home discharge with HHPT.  Signed, Leanor Kail, PA  06/21/2016, 7:35 AM

## 2016-06-21 NOTE — Telephone Encounter (Signed)
New message      TCM on 06-28-16 per vin with laura ingold

## 2016-06-22 NOTE — Telephone Encounter (Signed)
Left message for patient to call back for TCM follow up.

## 2016-06-23 NOTE — Telephone Encounter (Signed)
Patient's daughter (DPR on file) contacted regarding patient's  discharge from [facility] on 06/21/16.  Patient's daughter understands for patient to follow up with provider Robbie Lis, PA on 07/06/16 at 3:00 pm at [address], [place], [place], [place] [address].  Patient's daughter understands the patient's discharge instructions? Yes  Patient's daughter understands the patient's  medications and regiment? Yes  Patient's daughter understands to bring all medications to this visit? Yes  Patient's daughter requested that the original TCM appointment with Cecilie Kicks, NP be rescheduled from 06/30/16 to 07/06/16. She stated that she was unable to bring her father during the week of the original appointment due to her husband having surgery. Rescheduled TCM appointment on 07/06/16 with Vin Bhagat, PA at 3:00 pm. Daughter was in agreement and verbalized understanding.

## 2016-06-28 ENCOUNTER — Ambulatory Visit: Payer: Medicare Other | Admitting: Cardiology

## 2016-06-29 ENCOUNTER — Encounter: Payer: Medicare Other | Admitting: Internal Medicine

## 2016-07-06 ENCOUNTER — Ambulatory Visit: Payer: Medicare Other | Admitting: Physician Assistant

## 2016-07-19 ENCOUNTER — Ambulatory Visit (INDEPENDENT_AMBULATORY_CARE_PROVIDER_SITE_OTHER): Payer: Medicare Other | Admitting: Internal Medicine

## 2016-07-19 ENCOUNTER — Encounter (INDEPENDENT_AMBULATORY_CARE_PROVIDER_SITE_OTHER): Payer: Self-pay

## 2016-07-19 ENCOUNTER — Encounter: Payer: Self-pay | Admitting: Internal Medicine

## 2016-07-19 ENCOUNTER — Ambulatory Visit: Payer: Medicare Other | Admitting: Cardiology

## 2016-07-19 VITALS — BP 117/72 | HR 72 | Ht 70.0 in | Wt 205.0 lb

## 2016-07-19 DIAGNOSIS — I2510 Atherosclerotic heart disease of native coronary artery without angina pectoris: Secondary | ICD-10-CM

## 2016-07-19 DIAGNOSIS — I480 Paroxysmal atrial fibrillation: Secondary | ICD-10-CM | POA: Diagnosis not present

## 2016-07-19 DIAGNOSIS — I5022 Chronic systolic (congestive) heart failure: Secondary | ICD-10-CM

## 2016-07-19 DIAGNOSIS — Z95810 Presence of automatic (implantable) cardiac defibrillator: Secondary | ICD-10-CM | POA: Diagnosis not present

## 2016-07-19 NOTE — Patient Instructions (Addendum)
Medication Instructions: - Your physician recommends that you continue on your current medications as directed. Please refer to the Current Medication list given to you today.  Labwork: - none ordered  Procedures/Testing: - none ordered  Follow-Up: - Your physician wants you to follow-up in: 5 months with Dr. Caryl Comes. You will receive a reminder letter in the mail two months in advance. If you don't receive a letter, please call our office to schedule the follow-up appointment.   Any Additional Special Instructions Will Be Listed Below (If Applicable).     If you need a refill on your cardiac medications before your next appointment, please call your pharmacy.

## 2016-07-19 NOTE — Progress Notes (Signed)
Patient Care Team: Tommye Standard, MD as PCP - General (Family Medicine)   HPI  Thomas [name] is a 83 y.o. male Seen in follow-up for CRT-D implanted 2012 for polymorphic ventricular tachycardia in the setting of nonischemic disease with concomitant coronary disease.Marland Kitchen He was noted a year ago to have atrial fibrillation trigger decompensated heart failure.  He developed thyrotoxicosis. Amiodarone was discontinued; and methimazole was initiated.  His thyroid condition is being followed in [place] by  Dr. Alfonso Ramus  He has exercise intolerance and peripheral edema which is worsening.  He was recently hospitalized for heart failure. He underwent diuresis. There are comments related to autonomic insufficiency and possible Shy-Drager referring to the difficulties and diuresis and recurrent syncope. He is not on anticoagulation because of recurrent falls. Device interrogation notably had not shown interval atrial fibrillation. He underwent a 12 pound diuresis.  Records and Results Reviewed recent outpatient records and laboratories  Past Medical History:  Diagnosis Date  . AICD (automatic cardioverter/defibrillator) present    Status post CRT-D.  Marland Kitchen Auditory hallucinations    "said someone was talking to him from fireplace last week" (06/15/2016)  . CHF (congestive heart failure) ([place])   . Coronary artery disease    80% distal RCA stenosis.  . Depression    "takes Lexapro" (06/15/2016)  . GERD (gastroesophageal reflux disease)   . History of gout   . Hypertension    hx (06/16/2015)  . Hypertrophy of prostate without urinary obstruction and other lower urinary tract symptoms (LUTS)    BPH  . Myocardial infarction    "said they saw evidence of an old one when he had a cath" (06/15/2016)  . Nontoxic multinodular goiter   . Parkinson's disease   . Presence of permanent cardiac pacemaker    Status post CRT-D.  Marland Kitchen Psychosexual dysfunction with inhibited sexual excitement    ERECTILE  DYSFUNCTION  . Seizures ([place])    "only when when he took Levaquin" (06/15/2016)  . Skin cancer    "some burned off his arms" (06/15/2016)  . Status post biventricular cardiac pacemaker insertion    Status post CRT-D.  Marland Kitchen Thyroid disease    HYPOTHYROIDISM  . Wide-complex tachycardia ([place])     Past Surgical History:  Procedure Laterality Date  . ABDOMINAL ANGIOGRAM  06/10/2014   Procedure: ABDOMINAL ANGIOGRAM;  Surgeon: Sinclair Grooms, MD;  Location: [facility] CATH LAB;  Service: Cardiovascular;;  . BI-VENTRICULAR IMPLANTABLE CARDIOVERTER DEFIBRILLATOR  (CRT-D)     Archie Endo 06/16/2015  . CARDIAC CATHETERIZATION    . CARDIAC CATHETERIZATION  06/12/2014   Procedure: CORONARY STENT INTERVENTION W/IMPELLA;  Surgeon: Sinclair Grooms, MD;  Location: [facility] CATH LAB;  Service: Cardiovascular;;  . CARDIOVERSION N/A 06/03/2014   Procedure: CARDIOVERSION;  Surgeon: Thompson Grayer, MD;  Location: [facility] CATH LAB;  Service: Cardiovascular;  Laterality: N/A;  . CATARACT EXTRACTION W/ INTRAOCULAR LENS  IMPLANT, BILATERAL Bilateral   . LEFT HEART CATHETERIZATION WITH CORONARY ANGIOGRAM N/A 06/10/2014   Procedure: LEFT HEART CATHETERIZATION WITH CORONARY ANGIOGRAM;  Surgeon: Sinclair Grooms, MD;  Location: [facility] CATH LAB;  Service: Cardiovascular;  Laterality: N/A;  . PERCUTANEOUS CORONARY ROTOBLATOR INTERVENTION (PCI-R)  06/12/2014   Procedure: PERCUTANEOUS CORONARY ROTOBLATOR INTERVENTION (PCI-R);  Surgeon: Sinclair Grooms, MD;  Location: [facility] CATH LAB;  Service: Cardiovascular;;  . PERCUTANEOUS CORONARY STENT INTERVENTION (PCI-S) N/A 06/12/2014   Procedure: PERCUTANEOUS CORONARY STENT INTERVENTION (PCI-S);  Surgeon: Sinclair Grooms, MD;  Location: [facility] CATH LAB;  Service: Cardiovascular;  Laterality: N/A;  . SKIN CANCER EXCISION Left    "ear"    Current Outpatient Prescriptions  Medication Sig Dispense Refill  . acetaminophen (TYLENOL) 325 MG tablet Take 650 mg by mouth every 4 (four) hours as needed for mild pain.     Marland Kitchen allopurinol (ZYLOPRIM) 100 MG tablet Take 100 mg by mouth daily.    Marland Kitchen amantadine (SYMMETREL) 100 MG capsule Take 100 mg by mouth 2 (two) times daily.     Marland Kitchen aspirin EC 81 MG EC tablet Take 1 tablet (81 mg total) by mouth daily.    . carbidopa-levodopa (SINEMET IR) 25-100 MG per tablet Take 1 tablet by mouth 3 (three) times daily. 30 tablet 0  . clopidogrel (PLAVIX) 75 MG tablet Take 1 tablet (75 mg total) by mouth daily. 90 tablet 0  . escitalopram (LEXAPRO) 10 MG tablet Take 10 mg by mouth daily.    . Ferrous Sulfate (IRON) 325 (65 Fe) MG TABS Take 1 tablet by mouth daily.    . furosemide (LASIX) 40 MG tablet Take 1 tablet (40 mg total) by mouth 2 (two) times daily. 60 tablet 2  . ipratropium-albuterol (DUONEB) 0.5-2.5 (3) MG/3ML SOLN every 6 (six) hours as needed (wheezing).   1  . methimazole (TAPAZOLE) 10 MG tablet Take 1 tablet (10 mg total) by mouth daily. 30 tablet 0  . midodrine (PROAMATINE) 5 MG tablet Take 1 tablet (5 mg total) by mouth 2 (two) times daily with a meal. 60 tablet 3  . Multiple Vitamins-Minerals (ICAPS PO) Take 1 tablet by mouth 2 (two) times daily.    . nitroGLYCERIN (NITROSTAT) 0.4 MG SL tablet Place 0.4 mg under the tongue every 5 (five) minutes as needed for chest pain.     . Omega-3 Fatty Acids (FISH OIL) 1000 MG CAPS Take one by mouth twice daily      . OVER THE COUNTER MEDICATION Take 1 tablet by mouth 2 (two) times daily. Med Name: PROSVENT    . pantoprazole (PROTONIX) 20 MG tablet Take 20 mg by mouth daily.    . potassium chloride SA (K-DUR,KLOR-CON) 20 MEQ tablet Take 20 mEq by mouth daily.    . pramipexole (MIRAPEX) 0.5 MG tablet Take 0.5 mg by mouth 3 (three) times daily.    . rotigotine (NEUPRO) 6 MG/24HR Place 1 patch onto the skin daily.    Marland Kitchen ULORIC 40 MG tablet Take 40 mg by mouth daily.    . vitamin C (ASCORBIC ACID) 500 MG tablet Take 500 mg by mouth daily.     No current facility-administered medications for this visit.     Allergies  Allergen  Reactions  . Ciprofloxacin Other (See Comments)    "PER MD"  . Levaquin [Levofloxacin] Other (See Comments)    REACTION: "OUT OF HEAD/ SEIZURES"      Review of Systems negative except from HPI and PMH  Physical Exam BP 117/72   Pulse 72   Ht 5\' 10"  (1.778 m)   Wt 205 lb (93 kg)   SpO2 94%   BMI 29.41 kg/m  Well developed and well nourished in no acute distress HENT normal E scleral and icterus clear Neck Supple JVP flat; carotids brisk and full Clear to ausculation Device pocket well healed; without hematoma or erythema.  There is no tethering Regular rate and rhythm, no murmurs gallops or rub Soft with active bowel sounds No clubbing cyanosis 2+ Edema Alert and oriented, grossly normal motor and sensory function Skin Warm  and Dry  ECG demonstrated PVCs synchronous pacing with a negative QRS in V1 and a positive QRS  1. By shortening the AV delay 120--70, QRS V1 was made up right QRS in lead  1 was made negative. All echocardiographic PR interval was still about 160 ms  Assessment and  Plan  Atrial fibrillation-paroxysmal  No intercurrent atrial fibrillation or flutter  Nonischemic cardiomyopathy  Congestive heart failure-chronic-  Implantable defibrillator-CRT-[employer]  The patient's device was interrogated.  The information was reviewed. No changes were made in the programming.     Coronary artery disease  Hyyperthroidism   No significant interval atrial fibrillation. He is volume overloaded. We will have him take his furosemide 80/40 for 3 days and then back to 40 mg twice daily. Hopefully this in conjunction with reprogramming of his device to augment resynchronization will allow him to stay euvolemic.  Without symptoms of ischemia

## 2016-10-18 ENCOUNTER — Telehealth: Payer: Self-pay | Admitting: Cardiology

## 2016-10-18 ENCOUNTER — Encounter: Payer: Medicare Other | Admitting: *Deleted

## 2016-10-18 NOTE — Telephone Encounter (Signed)
LMOVM reminding pt to send remote transmission.   

## 2016-10-21 ENCOUNTER — Encounter: Payer: Self-pay | Admitting: Cardiology

## 2017-01-20 ENCOUNTER — Ambulatory Visit: Payer: Medicare Other | Admitting: Interventional Cardiology

## 2017-01-20 ENCOUNTER — Encounter: Payer: Medicare Other | Admitting: Internal Medicine

## 2017-01-24 ENCOUNTER — Telehealth: Payer: Self-pay | Admitting: Physician Assistant

## 2017-01-24 NOTE — Telephone Encounter (Signed)
New message    Pt daughter is calling stating that pt says his device is humming.

## 2017-01-24 NOTE — Telephone Encounter (Signed)
Daughter information: Vito Backers [phone].

## 2017-01-24 NOTE — Telephone Encounter (Signed)
Daughter called in stating that patient is c/o hearing a humming sound coming from his device since yesterday.She said that he told her it just happens once a day. I told her that his device can give off an audible alert tone for lead warnings and if it's time to replace the device. I told her that she would need to send in a transmission to know for sure that that's what it is. Daughter verbalized understanding and plans to send tonight.

## 2017-01-25 NOTE — Telephone Encounter (Signed)
Spoke with Ms. [place], she stated they tried twice to get the transmission to go through last night, and could not get it to go, she stated that she was at another hospital with her husband and would not be back to Mr. Radziewicz house until this evening, She stated that she would try and call [employer] to trouble shoot the monitor this afternoon or in the morning and would call us back. Informed Mr. [place] that if pt starts to have CP, SOB he should go to the Emergency Room she voiced understanding.

## 2017-01-25 NOTE — Telephone Encounter (Signed)
LVM for [facility] to call back regarding transmission.

## 2017-01-27 NOTE — Telephone Encounter (Signed)
Spoke with Ms. Thomas [name] she stated that the monitor appears to be broken, informed her that likely the device is at [facility], stressed the importance of keeping the appointment on 01/31/17. [name] stated that pt is feeling ok. Informed her that he has 3 months to get the device changed out once hearing that alert tone. [name] voiced understanding.

## 2017-01-29 NOTE — Progress Notes (Deleted)
Cardiology Office Note Date:  01/29/2017  Patient ID:  Thomas [name], Thomas [name] 1933-09-30, MRN [id number] PCP:  Tommye Standard, MD  Cardiologist:  Dr. Tamala Julian Electrophysiologist: Dr. Caryl Comes  ***refresh   Chief Complaint: device check/?ERI, unable to transmit, device alerting  History of Present Illness: Thomas [name] is a 83 y.o. male with history of CAD, with prior CHIP LAD and circumflex within [place] support in 2015, chronic CHF (systolic), PMVT w/ICD, Parkinson's Disease, orthostatic hypotension on Florinef, HTN, HLD, hyerthyroidism, PAFib (not on a/c 2/2 recurrent falls), CM. He comes in today to be seen for Dr. Caryl Comes, last seen by him in Feb, this was just after a CHF hospitalization that he was diuresed 12lbs, but again felt volume OL and given instructions for a few days of increased lasix dosing.  *** volume status *** device gen status *** meds *** florineff??? Fluid OL *** saw neuro last week, doing well??   Device information: MDT CRT-D, implanted 02/07/11, Dr Caryl Comes, PMVT   Past Medical History:  Diagnosis Date  . AICD (automatic cardioverter/defibrillator) present    Status post CRT-D.  Marland Kitchen Auditory hallucinations    "said someone was talking to him from fireplace last week" (06/15/2016)  . CHF (congestive heart failure) ([place])   . Coronary artery disease    80% distal RCA stenosis.  . Depression    "takes Lexapro" (06/15/2016)  . GERD (gastroesophageal reflux disease)   . History of gout   . Hypertension    hx (06/16/2015)  . Hypertrophy of prostate without urinary obstruction and other lower urinary tract symptoms (LUTS)    BPH  . Myocardial infarction    "said they saw evidence of an old one when he had a cath" (06/15/2016)  . Nontoxic multinodular goiter   . Parkinson's disease   . Presence of permanent cardiac pacemaker    Status post CRT-D.  Marland Kitchen Psychosexual dysfunction with inhibited sexual excitement    ERECTILE DYSFUNCTION  . Seizures ([place])    "only when when he  took Levaquin" (06/15/2016)  . Skin cancer    "some burned off his arms" (06/15/2016)  . Status post biventricular cardiac pacemaker insertion    Status post CRT-D.  Marland Kitchen Thyroid disease    HYPOTHYROIDISM  . Wide-complex tachycardia ([place])     Past Surgical History:  Procedure Laterality Date  . ABDOMINAL ANGIOGRAM  06/10/2014   Procedure: ABDOMINAL ANGIOGRAM;  Surgeon: Sinclair Grooms, MD;  Location: [facility] CATH LAB;  Service: Cardiovascular;;  . BI-VENTRICULAR IMPLANTABLE CARDIOVERTER DEFIBRILLATOR  (CRT-D)     Archie Endo 06/16/2015  . CARDIAC CATHETERIZATION    . CARDIAC CATHETERIZATION  06/12/2014   Procedure: CORONARY STENT INTERVENTION W/IMPELLA;  Surgeon: Sinclair Grooms, MD;  Location: [facility] CATH LAB;  Service: Cardiovascular;;  . CARDIOVERSION N/A 06/03/2014   Procedure: CARDIOVERSION;  Surgeon: Thompson Grayer, MD;  Location: [facility] CATH LAB;  Service: Cardiovascular;  Laterality: N/A;  . CATARACT EXTRACTION W/ INTRAOCULAR LENS  IMPLANT, BILATERAL Bilateral   . LEFT HEART CATHETERIZATION WITH CORONARY ANGIOGRAM N/A 06/10/2014   Procedure: LEFT HEART CATHETERIZATION WITH CORONARY ANGIOGRAM;  Surgeon: Sinclair Grooms, MD;  [facility] C[facility] LAB;  Service: Cardiovascular;  Laterality: N/A;  . PERCUTANEOUS CORONARY ROTOBLATOR INTERVENTION (PCI-R)  06/12/2014   Procedure: PERCUTANEOUS CORONARY ROTOBLATOR INTERVENTION (PCI-R);  Surgeon: Sinclair Grooms, MD;  Location: [facility] CATH LAB;  Service: Cardiovascular;;  . PERCUTANEOUS CORONARY STENT INTERVENTION (PCI-S) N/A 06/12/2014   Procedure: PERCUTANEOUS CORONARY STENT INTERVENTION (PCI-S);  Surgeon: Lynnell Dike  Blenda Bridegroom, MD;  Location: [facility] CATH LAB;  Service: Cardiovascular;  Laterality: N/A;  . SKIN CANCER EXCISION Left    "ear"    Current Outpatient Prescriptions  Medication Sig Dispense Refill  . acetaminophen (TYLENOL) 325 MG tablet Take 650 mg by mouth every 4 (four) hours as needed for mild pain.    Marland Kitchen allopurinol (ZYLOPRIM) 100 MG tablet Take 100 mg  by mouth daily.    Marland Kitchen amantadine (SYMMETREL) 100 MG capsule Take 100 mg by mouth 2 (two) times daily.     Marland Kitchen aspirin EC 81 MG EC tablet Take 1 tablet (81 mg total) by mouth daily.    . carbidopa-levodopa (SINEMET IR) 25-100 MG per tablet Take 1 tablet by mouth 3 (three) times daily. 30 tablet 0  . clopidogrel (PLAVIX) 75 MG tablet Take 1 tablet (75 mg total) by mouth daily. 90 tablet 0  . escitalopram (LEXAPRO) 10 MG tablet Take 10 mg by mouth daily.    . Ferrous Sulfate (IRON) 325 (65 Fe) MG TABS Take 1 tablet by mouth daily.    . furosemide (LASIX) 40 MG tablet Take 1 tablet (40 mg total) by mouth 2 (two) times daily. 60 tablet 2  . ipratropium-albuterol (DUONEB) 0.5-2.5 (3) MG/3ML SOLN every 6 (six) hours as needed (wheezing).   1  . methimazole (TAPAZOLE) 10 MG tablet Take 1 tablet (10 mg total) by mouth daily. 30 tablet 0  . midodrine (PROAMATINE) 5 MG tablet Take 1 tablet (5 mg total) by mouth 2 (two) times daily with a meal. 60 tablet 3  . Multiple Vitamins-Minerals (ICAPS PO) Take 1 tablet by mouth 2 (two) times daily.    . nitroGLYCERIN (NITROSTAT) 0.4 MG SL tablet Place 0.4 mg under the tongue every 5 (five) minutes as needed for chest pain.     . Omega-3 Fatty Acids (FISH OIL) 1000 MG CAPS Take one by mouth twice daily      . OVER THE COUNTER MEDICATION Take 1 tablet by mouth 2 (two) times daily. Med Name: PROSVENT    . pantoprazole (PROTONIX) 20 MG tablet Take 20 mg by mouth daily.    . potassium chloride SA (K-DUR,KLOR-CON) 20 MEQ tablet Take 20 mEq by mouth daily.    . pramipexole (MIRAPEX) 0.5 MG tablet Take 0.5 mg by mouth 3 (three) times daily.    . rotigotine (NEUPRO) 6 MG/24HR Place 1 patch onto the skin daily.    Marland Kitchen ULORIC 40 MG tablet Take 40 mg by mouth daily.    . vitamin C (ASCORBIC ACID) 500 MG tablet Take 500 mg by mouth daily.     No current facility-administered medications for this visit.     Allergies:   Ciprofloxacin and Levaquin [levofloxacin]   Social History:   The patient  reports that he quit smoking about 21 years ago. His smoking use included Cigarettes, Cigars, and Pipe. He has a 45.00 pack-year smoking history. He has never used smokeless tobacco. He reports that he does not drink alcohol or use drugs.   Family History:  The patient's family history includes Coronary artery disease in his other; Heart attack in his mother.  ROS:  Please see the history of present illness.   All other systems are reviewed and otherwise negative.   PHYSICAL EXAM: *** VS:  There were no vitals taken for this visit. BMI: There is no height or weight on file to calculate BMI. Well nourished, well developed, in no acute distress  HEENT: normocephalic, atraumatic  Neck:  no JVD, carotid bruits or masses Cardiac:  *** RRR; no significant murmurs, no rubs, or gallops Lungs:  *** CTA b/l, no wheezing, rhonchi or rales  Abd: soft, nontender MS: no deformity or *** atrophy Ext: *** no edema  Skin: warm and dry, no rash Neuro:  No gross deficits appreciated Psych: euthymic mood, full affect  *** ICD site is stable, no tethering or discomfort   EKG:  Done today shows *** ICD interrogation done today by industry and reviewed by myself: ***  06/17/16: TTE Study Conclusions - Left ventricle: Diffuse hypokinesis worse in the sepum apex and   inferior wall. The cavity size was severely dilated. There was   mild focal basal hypertrophy of the septum. The estimated   ejection fraction was 25%. - Mitral valve: There was mild regurgitation. - Left atrium: The atrium was moderately dilated. - Atrial septum: No defect or patent foramen ovale was identified.   Recent Labs: 06/15/2016: ALT 11; B Natriuretic Peptide 532.4; Hemoglobin 11.3; Platelets 144 06/20/2016: Magnesium 2.6 06/21/2016: BUN 34; Creatinine, Ser 1.60; Potassium 3.6; Sodium 140  No results found for requested labs within last 8760 hours.   CrCl cannot be calculated (Patient's most recent lab result is older  than the maximum 21 days allowed.).   Wt Readings from Last 3 Encounters:  07/19/16 205 lb (93 kg)  06/21/16 194 lb (88 kg)  06/15/16 211 lb (95.7 kg)     Other studies reviewed: Additional studies/records reviewed today include: summarized above  ASSESSMENT AND PLAN:  1. CRT-D     ***  2. Mixed ischemic/non-ischemic CM     ***  3. CAD     ***  4. Chronic CHF     ***   Disposition: F/u with ***  Current medicines are reviewed at length with the patient today.  The patient did not have any concerns regarding medicines.***  Signed, Jennings Books, PA-C 01/29/2017 8:43 AM     [facility] [place] [address] [phone] (office)  [phone] (fax)

## 2017-01-31 ENCOUNTER — Encounter: Payer: Medicare Other | Admitting: Physician Assistant

## 2017-03-13 DEATH — deceased
